# Patient Record
Sex: Male | Born: 1950
Health system: Southern US, Community
[De-identification: ages and names within clinical notes are randomized; demographics above are authoritative.]

## PROBLEM LIST (undated history)

## (undated) DIAGNOSIS — E785 Hyperlipidemia, unspecified: Secondary | ICD-10-CM

## (undated) DIAGNOSIS — K589 Irritable bowel syndrome without diarrhea: Secondary | ICD-10-CM

## (undated) DIAGNOSIS — Z8719 Personal history of other diseases of the digestive system: Secondary | ICD-10-CM

## (undated) DIAGNOSIS — I10 Essential (primary) hypertension: Secondary | ICD-10-CM

## (undated) DIAGNOSIS — N529 Male erectile dysfunction, unspecified: Secondary | ICD-10-CM

## (undated) DIAGNOSIS — K219 Gastro-esophageal reflux disease without esophagitis: Secondary | ICD-10-CM

## (undated) DIAGNOSIS — I872 Venous insufficiency (chronic) (peripheral): Secondary | ICD-10-CM

## (undated) DIAGNOSIS — D649 Anemia, unspecified: Secondary | ICD-10-CM

## (undated) DIAGNOSIS — F419 Anxiety disorder, unspecified: Secondary | ICD-10-CM

## (undated) DIAGNOSIS — G4730 Sleep apnea, unspecified: Secondary | ICD-10-CM

## (undated) DIAGNOSIS — T7840XA Allergy, unspecified, initial encounter: Secondary | ICD-10-CM

## (undated) DIAGNOSIS — Z860100 Personal history of colon polyps, unspecified: Secondary | ICD-10-CM

## (undated) DIAGNOSIS — M1990 Unspecified osteoarthritis, unspecified site: Secondary | ICD-10-CM

## (undated) DIAGNOSIS — Z87440 Personal history of urinary (tract) infections: Secondary | ICD-10-CM

## (undated) DIAGNOSIS — M791 Myalgia, unspecified site: Secondary | ICD-10-CM

## (undated) DIAGNOSIS — Z8701 Personal history of pneumonia (recurrent): Secondary | ICD-10-CM

## (undated) HISTORY — PX: VASECTOMY: SHX75

## (undated) HISTORY — PX: COLON SURGERY: SHX602

## (undated) HISTORY — DX: Allergy, unspecified, initial encounter: T78.40XA

## (undated) HISTORY — DX: Myalgia, unspecified site: M79.10

## (undated) HISTORY — PX: COLONOSCOPY: SHX174

## (undated) HISTORY — DX: Personal history of colonic polyps: Z86.010

## (undated) HISTORY — DX: Gastro-esophageal reflux disease without esophagitis: K21.9

## (undated) HISTORY — DX: Personal history of colon polyps, unspecified: Z86.0100

## (undated) HISTORY — DX: Male erectile dysfunction, unspecified: N52.9

## (undated) HISTORY — DX: Sleep apnea, unspecified: G47.30

## (undated) HISTORY — DX: Anemia, unspecified: D64.9

## (undated) HISTORY — DX: Personal history of other diseases of the digestive system: Z87.19

## (undated) HISTORY — DX: Venous insufficiency (chronic) (peripheral): I87.2

## (undated) HISTORY — DX: Hyperlipidemia, unspecified: E78.5

## (undated) HISTORY — PX: APPENDECTOMY: SHX54

## (undated) HISTORY — DX: Personal history of urinary (tract) infections: Z87.440

## (undated) HISTORY — DX: Anxiety disorder, unspecified: F41.9

## (undated) HISTORY — DX: Essential (primary) hypertension: I10

## (undated) HISTORY — DX: Irritable bowel syndrome, unspecified: K58.9

---

## 2003-04-25 ENCOUNTER — Emergency Department (HOSPITAL_COMMUNITY): Admission: EM | Admit: 2003-04-25 | Discharge: 2003-04-26 | Payer: Self-pay | Admitting: Emergency Medicine

## 2003-12-14 ENCOUNTER — Ambulatory Visit: Payer: Self-pay | Admitting: Pulmonary Disease

## 2004-06-28 ENCOUNTER — Ambulatory Visit: Payer: Self-pay | Admitting: Pulmonary Disease

## 2004-12-06 ENCOUNTER — Ambulatory Visit: Payer: Self-pay | Admitting: Pulmonary Disease

## 2005-06-11 ENCOUNTER — Ambulatory Visit: Payer: Self-pay | Admitting: Pulmonary Disease

## 2005-06-22 ENCOUNTER — Ambulatory Visit: Payer: Self-pay | Admitting: Gastroenterology

## 2005-07-02 ENCOUNTER — Ambulatory Visit: Payer: Self-pay | Admitting: Gastroenterology

## 2006-01-02 ENCOUNTER — Ambulatory Visit: Payer: Self-pay | Admitting: Pulmonary Disease

## 2006-03-13 ENCOUNTER — Ambulatory Visit: Payer: Self-pay | Admitting: Pulmonary Disease

## 2006-07-30 ENCOUNTER — Ambulatory Visit: Payer: Self-pay | Admitting: Pulmonary Disease

## 2006-07-30 LAB — CONVERTED CEMR LAB
AST: 39 units/L — ABNORMAL HIGH (ref 0–37)
BUN: 13 mg/dL (ref 6–23)
Basophils Absolute: 0 10*3/uL (ref 0.0–0.1)
Basophils Relative: 0.2 % (ref 0.0–1.0)
Bilirubin, Direct: 0.1 mg/dL (ref 0.0–0.3)
CO2: 30 meq/L (ref 19–32)
Chloride: 102 meq/L (ref 96–112)
Cholesterol: 154 mg/dL (ref 0–200)
Eosinophils Relative: 11.1 % — ABNORMAL HIGH (ref 0.0–5.0)
GFR calc Af Amer: 73 mL/min
GFR calc non Af Amer: 61 mL/min
Glucose, Bld: 108 mg/dL — ABNORMAL HIGH (ref 70–99)
HCT: 40.4 % (ref 39.0–52.0)
HDL: 44.2 mg/dL (ref >39.0)
LDL Cholesterol: 90 mg/dL (ref 0–99)
Lymphocytes Relative: 35.4 % (ref 12.0–46.0)
MCHC: 34 g/dL (ref 30.0–36.0)
MCV: 84.3 fL (ref 78.0–100.0)
Monocytes Relative: 9.9 % (ref 3.0–11.0)
Neutrophils Relative %: 43.4 % (ref 43.0–77.0)
PSA: 0.47 ng/mL (ref 0.10–4.00)
Platelets: 212 10*3/uL (ref 150–400)
RDW: 12.6 % (ref 11.5–14.6)
Total CHOL/HDL Ratio: 3.5
Total Protein: 7 g/dL (ref 6.0–8.3)
VLDL: 20 mg/dL (ref 0–40)

## 2006-09-05 DIAGNOSIS — E785 Hyperlipidemia, unspecified: Secondary | ICD-10-CM | POA: Insufficient documentation

## 2006-09-06 ENCOUNTER — Ambulatory Visit: Payer: Self-pay | Admitting: Pulmonary Disease

## 2006-11-15 ENCOUNTER — Ambulatory Visit: Payer: Self-pay | Admitting: Pulmonary Disease

## 2007-02-05 ENCOUNTER — Telehealth (INDEPENDENT_AMBULATORY_CARE_PROVIDER_SITE_OTHER): Payer: Self-pay | Admitting: *Deleted

## 2007-02-05 ENCOUNTER — Telehealth: Payer: Self-pay | Admitting: Pulmonary Disease

## 2007-08-11 ENCOUNTER — Telehealth: Payer: Self-pay | Admitting: Pulmonary Disease

## 2007-09-24 ENCOUNTER — Telehealth (INDEPENDENT_AMBULATORY_CARE_PROVIDER_SITE_OTHER): Payer: Self-pay | Admitting: *Deleted

## 2007-10-06 ENCOUNTER — Ambulatory Visit: Payer: Self-pay | Admitting: Pulmonary Disease

## 2007-10-06 DIAGNOSIS — I10 Essential (primary) hypertension: Secondary | ICD-10-CM

## 2007-10-07 ENCOUNTER — Ambulatory Visit: Payer: Self-pay | Admitting: Pulmonary Disease

## 2007-10-17 ENCOUNTER — Telehealth (INDEPENDENT_AMBULATORY_CARE_PROVIDER_SITE_OTHER): Payer: Self-pay | Admitting: *Deleted

## 2007-10-18 DIAGNOSIS — K589 Irritable bowel syndrome without diarrhea: Secondary | ICD-10-CM

## 2007-10-18 DIAGNOSIS — F411 Generalized anxiety disorder: Secondary | ICD-10-CM | POA: Insufficient documentation

## 2007-10-18 DIAGNOSIS — N390 Urinary tract infection, site not specified: Secondary | ICD-10-CM

## 2007-10-18 DIAGNOSIS — F419 Anxiety disorder, unspecified: Secondary | ICD-10-CM | POA: Insufficient documentation

## 2007-10-18 DIAGNOSIS — K2990 Gastroduodenitis, unspecified, without bleeding: Secondary | ICD-10-CM

## 2007-10-18 DIAGNOSIS — F528 Other sexual dysfunction not due to a substance or known physiological condition: Secondary | ICD-10-CM | POA: Insufficient documentation

## 2007-10-18 DIAGNOSIS — M791 Myalgia, unspecified site: Secondary | ICD-10-CM | POA: Insufficient documentation

## 2007-10-18 DIAGNOSIS — I872 Venous insufficiency (chronic) (peripheral): Secondary | ICD-10-CM | POA: Insufficient documentation

## 2007-10-18 DIAGNOSIS — K2970 Gastritis, unspecified, without bleeding: Secondary | ICD-10-CM | POA: Insufficient documentation

## 2007-10-18 LAB — CONVERTED CEMR LAB
ALT: 22 units/L (ref 0–53)
AST: 30 units/L (ref 0–37)
Albumin: 3.8 g/dL (ref 3.5–5.2)
Alkaline Phosphatase: 70 units/L (ref 39–117)
Basophils Absolute: 0 10*3/uL (ref 0.0–0.1)
Bilirubin, Direct: 0.2 mg/dL (ref 0.0–0.3)
Calcium: 9.5 mg/dL (ref 8.4–10.5)
Chloride: 103 meq/L (ref 96–112)
Cholesterol: 183 mg/dL (ref 0–200)
Creatinine, Ser: 1.5 mg/dL (ref 0.4–1.5)
Eosinophils Absolute: 0.2 10*3/uL (ref 0.0–0.7)
GFR calc non Af Amer: 51 mL/min
HCT: 40.4 % (ref 39.0–52.0)
Hemoglobin: 14 g/dL (ref 13.0–17.0)
MCV: 84.6 fL (ref 78.0–100.0)
Monocytes Relative: 13.1 % — ABNORMAL HIGH (ref 3.0–12.0)
Neutrophils Relative %: 45.5 % (ref 43.0–77.0)
Potassium: 4.4 meq/L (ref 3.5–5.1)
RDW: 12.9 % (ref 11.5–14.6)
TSH: 1.99 microintl units/mL (ref 0.35–5.50)
Total Bilirubin: 0.8 mg/dL (ref 0.3–1.2)
Triglycerides: 86 mg/dL (ref 0–149)

## 2008-01-05 ENCOUNTER — Telehealth (INDEPENDENT_AMBULATORY_CARE_PROVIDER_SITE_OTHER): Payer: Self-pay | Admitting: *Deleted

## 2008-02-09 ENCOUNTER — Telehealth: Payer: Self-pay | Admitting: Pulmonary Disease

## 2008-09-30 ENCOUNTER — Telehealth: Payer: Self-pay | Admitting: Pulmonary Disease

## 2008-10-04 ENCOUNTER — Ambulatory Visit: Payer: Self-pay | Admitting: Pulmonary Disease

## 2008-10-09 LAB — CONVERTED CEMR LAB
ALT: 32 units/L (ref 0–53)
AST: 52 units/L — ABNORMAL HIGH (ref 0–37)
Alkaline Phosphatase: 67 units/L (ref 39–117)
BUN: 13 mg/dL (ref 6–23)
Bilirubin Urine: NEGATIVE
Bilirubin, Direct: 0.2 mg/dL (ref 0.0–0.3)
Calcium: 9.8 mg/dL (ref 8.4–10.5)
Chloride: 104 meq/L (ref 96–112)
Creatinine, Ser: 1.4 mg/dL (ref 0.4–1.5)
GFR calc non Af Amer: 66.77 mL/min (ref >60)
LDL Cholesterol: 101 mg/dL — ABNORMAL HIGH (ref 0–99)
MCV: 83.9 fL (ref 78.0–100.0)
Nitrite: NEGATIVE
PSA: 0.5 ng/mL (ref 0.10–4.00)
RBC: 5.03 M/uL (ref 4.22–5.81)
Specific Gravity, Urine: 1.02 (ref 1.000–1.030)
TSH: 1.24 microintl units/mL (ref 0.35–5.50)
Total CHOL/HDL Ratio: 3
Total Protein, Urine: NEGATIVE mg/dL
Total Protein: 8.8 g/dL — ABNORMAL HIGH (ref 6.0–8.3)
Urine Glucose: NEGATIVE mg/dL
Urobilinogen, UA: 0.2 (ref 0.0–1.0)
WBC: 7.1 10*3/uL (ref 4.5–10.5)
pH: 6 (ref 5.0–8.0)

## 2009-04-15 ENCOUNTER — Telehealth: Payer: Self-pay | Admitting: Pulmonary Disease

## 2009-04-19 ENCOUNTER — Telehealth (INDEPENDENT_AMBULATORY_CARE_PROVIDER_SITE_OTHER): Payer: Self-pay | Admitting: *Deleted

## 2009-04-21 ENCOUNTER — Encounter: Payer: Self-pay | Admitting: Pulmonary Disease

## 2009-05-23 ENCOUNTER — Telehealth (INDEPENDENT_AMBULATORY_CARE_PROVIDER_SITE_OTHER): Payer: Self-pay | Admitting: *Deleted

## 2009-05-23 ENCOUNTER — Telehealth: Payer: Self-pay | Admitting: Gastroenterology

## 2009-05-23 DIAGNOSIS — Z8719 Personal history of other diseases of the digestive system: Secondary | ICD-10-CM

## 2009-05-24 ENCOUNTER — Encounter (INDEPENDENT_AMBULATORY_CARE_PROVIDER_SITE_OTHER): Payer: Self-pay | Admitting: *Deleted

## 2009-05-24 ENCOUNTER — Ambulatory Visit: Payer: Self-pay | Admitting: Gastroenterology

## 2009-05-24 DIAGNOSIS — R195 Other fecal abnormalities: Secondary | ICD-10-CM

## 2009-05-25 LAB — CONVERTED CEMR LAB
AST: 59 units/L — ABNORMAL HIGH (ref 0–37)
Alkaline Phosphatase: 61 units/L (ref 39–117)
Basophils Absolute: 0 10*3/uL (ref 0.0–0.1)
CO2: 29 meq/L (ref 19–32)
Calcium: 9.7 mg/dL (ref 8.4–10.5)
Ferritin: 87.8 ng/mL (ref 22.0–322.0)
Glucose, Bld: 91 mg/dL (ref 70–99)
Hemoglobin: 13.8 g/dL (ref 13.0–17.0)
Lymphs Abs: 1.4 10*3/uL (ref 0.7–4.0)
Monocytes Relative: 9.9 % (ref 3.0–12.0)
Potassium: 4.7 meq/L (ref 3.5–5.1)
RBC: 4.81 M/uL (ref 4.22–5.81)
RDW: 13.7 % (ref 11.5–14.6)
Saturation Ratios: 25.7 % (ref 20.0–50.0)
TSH: 1.01 microintl units/mL (ref 0.35–5.50)

## 2009-06-01 ENCOUNTER — Ambulatory Visit: Payer: Self-pay | Admitting: Gastroenterology

## 2009-06-02 ENCOUNTER — Telehealth: Payer: Self-pay | Admitting: Gastroenterology

## 2009-06-03 ENCOUNTER — Encounter: Payer: Self-pay | Admitting: Gastroenterology

## 2009-06-24 ENCOUNTER — Encounter: Payer: Self-pay | Admitting: Gastroenterology

## 2009-07-01 HISTORY — PX: COLON SURGERY: SHX602

## 2009-07-08 ENCOUNTER — Encounter: Admission: RE | Admit: 2009-07-08 | Discharge: 2009-07-08 | Payer: Self-pay | Admitting: General Surgery

## 2009-07-21 ENCOUNTER — Encounter (INDEPENDENT_AMBULATORY_CARE_PROVIDER_SITE_OTHER): Payer: Self-pay | Admitting: General Surgery

## 2009-07-21 ENCOUNTER — Inpatient Hospital Stay (HOSPITAL_COMMUNITY): Admission: RE | Admit: 2009-07-21 | Discharge: 2009-07-25 | Payer: Self-pay | Admitting: General Surgery

## 2009-08-01 ENCOUNTER — Encounter: Payer: Self-pay | Admitting: Gastroenterology

## 2009-11-01 ENCOUNTER — Telehealth: Payer: Self-pay | Admitting: Pulmonary Disease

## 2010-01-10 ENCOUNTER — Other Ambulatory Visit: Payer: Self-pay | Admitting: Pulmonary Disease

## 2010-01-10 ENCOUNTER — Ambulatory Visit
Admission: RE | Admit: 2010-01-10 | Discharge: 2010-01-10 | Payer: Self-pay | Source: Home / Self Care | Attending: Pulmonary Disease | Admitting: Pulmonary Disease

## 2010-01-10 LAB — CBC WITH DIFFERENTIAL/PLATELET
Basophils Absolute: 0 10*3/uL (ref 0.0–0.1)
Basophils Relative: 0.5 % (ref 0.0–3.0)
Eosinophils Absolute: 0.4 10*3/uL (ref 0.0–0.7)
Eosinophils Relative: 11.6 % — ABNORMAL HIGH (ref 0.0–5.0)
HCT: 39.3 % (ref 39.0–52.0)
Hemoglobin: 13.5 g/dL (ref 13.0–17.0)
Lymphocytes Relative: 41.3 % (ref 12.0–46.0)
Lymphs Abs: 1.4 10*3/uL (ref 0.7–4.0)
MCHC: 34.4 g/dL (ref 30.0–36.0)
MCV: 83.2 fl (ref 78.0–100.0)
Monocytes Absolute: 0.3 10*3/uL (ref 0.1–1.0)
Monocytes Relative: 10 % (ref 3.0–12.0)
Neutro Abs: 1.2 10*3/uL — ABNORMAL LOW (ref 1.4–7.7)
Neutrophils Relative %: 36.6 % — ABNORMAL LOW (ref 43.0–77.0)
Platelets: 239 10*3/uL (ref 150.0–400.0)
RBC: 4.72 Mil/uL (ref 4.22–5.81)
RDW: 13.5 % (ref 11.5–14.6)
WBC: 3.4 10*3/uL — ABNORMAL LOW (ref 4.5–10.5)

## 2010-01-10 LAB — HEPATIC FUNCTION PANEL
ALT: 28 U/L (ref 0–53)
AST: 43 U/L — ABNORMAL HIGH (ref 0–37)
Albumin: 4.2 g/dL (ref 3.5–5.2)
Alkaline Phosphatase: 74 U/L (ref 39–117)
Bilirubin, Direct: 0.1 mg/dL (ref 0.0–0.3)
Total Bilirubin: 0.8 mg/dL (ref 0.3–1.2)
Total Protein: 7.9 g/dL (ref 6.0–8.3)

## 2010-01-10 LAB — BASIC METABOLIC PANEL
BUN: 12 mg/dL (ref 6–23)
CO2: 32 mEq/L (ref 19–32)
Calcium: 9.9 mg/dL (ref 8.4–10.5)
Chloride: 105 mEq/L (ref 96–112)
Creatinine, Ser: 1.1 mg/dL (ref 0.4–1.5)
GFR: 86.01 mL/min (ref >60.00)
Glucose, Bld: 84 mg/dL (ref 70–99)
Potassium: 4.6 mEq/L (ref 3.5–5.1)
Sodium: 141 mEq/L (ref 135–145)

## 2010-01-10 LAB — LIPID PANEL
Cholesterol: 154 mg/dL (ref 0–200)
HDL: 56.5 mg/dL (ref >39.00)
LDL Cholesterol: 87 mg/dL (ref 0–99)
Total CHOL/HDL Ratio: 3
Triglycerides: 53 mg/dL (ref 0.0–149.0)
VLDL: 10.6 mg/dL (ref 0.0–40.0)

## 2010-01-10 LAB — TSH: TSH: 1.52 u[IU]/mL (ref 0.35–5.50)

## 2010-01-10 LAB — PSA: PSA: 1.96 ng/mL (ref 0.10–4.00)

## 2010-02-02 NOTE — Progress Notes (Signed)
Summary: talk to nurse-LMTCB  Phone Note Call from Patient Call back at Work Phone [phone]   Caller: Patient Call For: nadel Summary of Call: Blood in stool, pls advise. Initial call taken by: Darletta Moll,  May 23, 2009 11:37 AM  Follow-up for Phone Call        +LMTCB. Carron Curie CMA  May 23, 2009 11:58 AM   Patient returning call.  Please call on cell--[phone]. Follow-up by: Lehman Prom,  May 23, 2009 12:35 PM  Additional Follow-up for Phone Call Additional follow up Details #1::        pt c/o x2 this weekend he saw bright red blood when he had a bowel movement. The blood was in the water and also when he wiped. He denies constipation. He states this happended several years ago and he was worked up and discovered he had polyps, but has never had any bleeding since then. He cannot remember what MD he saw for this years ago. Please advise. Carron Curie CMA  May 23, 2009 1:16 PM   New Problems: HEMATOCHEZIA, HX OF (ICD-V12.79)   Additional Follow-up for Phone Call Additional follow up Details #2::    per SN---refer to GI for eval of hematochezia.  order has been put in computer for pt. Randell Loop CMA  May 23, 2009 1:49 PM   Spoke with pt and advised that per SN- needs to see GI.  Pt aware that order has already been sent. Follow-up by: Vernie Murders,  May 23, 2009 2:05 PM  New Problems: HEMATOCHEZIA, HX OF (ICD-V12.79)

## 2010-02-02 NOTE — Letter (Signed)
Summary: Patient Notice- Polyp Results  [facility]  [address] [place], [place] [address]   Phone: [phone]  Fax: [phone]        June 03, 2009 MRN: [id number]    [facility] [address] [place], [place]  [address]    Dear Robert [name],  I am pleased to inform you that the colon polyp(s) removed during your recent colonoscopy was (were) found to be benign (no cancer detected) upon pathologic examination.  I recommend you have a repeat colonoscopy examination in 3_ years to look for recurrent polyps, as having colon polyps increases your risk for having recurrent polyps or even colon cancer in the future.Pathologic exam of the polyp shows a large villous adenoma that will require surgical resection as we discussed.  Should you develop new or worsening symptoms of abdominal pain, bowel habit changes or bleeding from the rectum or bowels, please schedule an evaluation with either your primary care physician or with me.  Additional information/recommendations:  __ No further action with gastroenterology is needed at this time. Please      follow-up with your primary care physician for your other healthcare      needs.  _x_ Please call [phone] to schedule a return visit to review your      situation if you have any further questions. __ Please keep your follow-up visit as already scheduled.  _x_ Continue treatment plan as outlined the day of your exam.  Please call us if you are having persistent problems or have questions about your condition that have not been fully answered at this time.  Sincerely,  Mardella Layman MD [facility]  This letter has been electronically signed by your physician.  Appended Document: Patient Notice- Polyp Results letter mailed.

## 2010-02-02 NOTE — Progress Notes (Signed)
Summary: Appt [facility]  Phone Note Outgoing Call   Call placed by: Ashok Cordia RN,  June 02, 2009 2:26 PM Summary of Call: records faxed to [facility] for appt with Dr. Gaynelle Adu re surgical resection of sessile polyp. Initial call taken by: Ashok Cordia RN,  June 02, 2009 2:28 PM  Follow-up for Phone Call        [place] calling from [facility].  Pt has appt to see Dr. Andrey Campanile on June 24.  Arrive at 8:30.   Lupita Leash Surface RN  June 02, 2009 4:37 PM  LM for pt to call.  Lupita Leash Surface RN  June 03, 2009 2:08 PM   Pt notified of appt. Follow-up by: Ashok Cordia RN,  June 06, 2009 8:18 AM

## 2010-02-02 NOTE — Assessment & Plan Note (Signed)
Summary: one year//jrc   Primary Care Provider:  Alroy Dust, MD  CC:  15 month ROV & review of mult medical problems....  History of Present Illness: 60 y/o Robert [name] here for a follow up visit and CPX... he has multiple medical problems as noted below...     ~  January 10, 2010:  [name] saw some rectal blood 5/11 & saw DrPatterson for eval- subseq colonoscopy revealed a large multilobulated sessile polyp at hepatic flexure- bx= tubulovillous adenoma & he had a lap-assisted right hemicolectomy 7/11 by DrWilson... uneventful post-op recovery & back to normal now... BP controlled on Aten/ Amlod;  Chol has been good on diet+ Gemfib;  he uses Omep40 Prn for acid symptoms;  uses Alpraz 1/2 Prn as well...    Current Problem List:  PHYSICAL EXAMINATION (ICD-V70.0) - on ASA 81mg /d... exercises regularly w/ elliptical machine, weights, etc... he had PNEUMOVAX in 2005 (age 38)... he gets the yearly Flu vaccine each autumn.  HYPERTENSION (ICD-401.9) - on ATENOLOL 100mg /d,  & NORVASC 10mg /d... BP= 128/82 today and similar at home, taking meds regulaly & tolerating well... denies HA, fatigue, visual changes, CP, palipit, dizziness, syncope, dyspnea, edema, etc...   VENOUS INSUFFICIENCY (ICD-459.81) - he knows to elim sodium, elevate legs, wear support hose...   HYPERLIPIDEMIA, BORDERLINE (ICD-272.4) - INTOL to Statin meds... on GEMFIBRIZOL 600mg Bid w/ good control- his Chol has been over 300 in the past & intol to statins w/ incr musc enz.  ~  FLP 7/08 (wt=218#) showed TChol 154, TG 98, HDL 44, LDL 90...  ~  FLP 10/09 (wt=222#) showed TChol 183, TG 86, HDL 48, LDL 118... rec> same med + better diet.  ~  FLP 10/10 (wt=216#) showed TChol 173, TG 99, HDL 53, LDL 101  ~  FLP 1/12 (wt=209#) showed TChol 154, TG 53, HDL 57, LDL 87  Hx of GASTRITIS (ICD-535.50) - on OMEPRAZOLE 40mg  Prn... denies nausea, vomiting, heartburn, diarrhea, constipation, blood in stool, abdominal pain, swelling, gas...   ~  EGD 7/07  by DrPatterson showed gastritis & duodenitis- HPylori neg...  IRRITABLE BOWEL SYNDROME (ICD-564.1) - Hx IBS w/ constipation...  COLONIC POLYPS (ICD-211.3) - s/p resection villous adenoma 7/11...  ~  colonoscopy 7/07 by DrPatterson was WNL.  ~  pt saw some rectal blood 5/11> eval DrPatterson w/ colonoscopy revealing a large multilobulated sessile polyp at hepatic flexure- bx= tubulovillous adenoma & he had a lap-assisted right hemicolectomy 7/11 by DrWilson.... surg path showed high grade dysplasia, no cancer, neg nodes.  Hx of URINARY TRACT INFECTION (ICD-599.0) - he has been evaluated by DrWrenn in 2001 w/ prostatitis... he has some nocturia & we discussed strategies to decr this...  Hx of ERECTILE DYSFUNCTION, MILD (ICD-302.72) - on VIAGRA 100mg Prn...  Hx of MYALGIA (ICD-729.1) - s/p eval from DrZieminski in 2005 & his CPK elevations were believed secondary to prev Statin Rx...  ANXIETY (ICD-300.00) - uses ALPRAZOLAM 0.5mg - 1/2 to 1 tab Tid Prn...   Preventive Screening-Counseling & Management  Alcohol-Tobacco     Smoking Status: never  Caffeine-Diet-Exercise     Does Patient Exercise: yes  Allergies: 1)  ! Lipitor (Atorvastatin Calcium)  Comments:  Nurse/Medical Assistant: The patient's medications and allergies were reviewed with the patient and were updated in the Medication and Allergy Lists.  Past History:  Past Medical History: HYPERTENSION (ICD-401.9) VENOUS INSUFFICIENCY (ICD-459.81) HYPERLIPIDEMIA, BORDERLINE (ICD-272.4) Hx of GASTRITIS (ICD-535.50) IRRITABLE BOWEL SYNDROME (ICD-564.1) COLONIC POLYPS (ICD-211.3) - s/p right hemicolectomy for tubulovillous adenoma 7/11 Hx of URINARY TRACT INFECTION (  ICD-599.0) Hx of ERECTILE DYSFUNCTION, MILD (ICD-302.72) Hx of MYALGIA (ICD-729.1) ANXIETY (ICD-300.00)  Past Surgical History: S/P appendectomy S/P vasectomy S/P right hemicolectomy 7/11 for tubulovillous adenoma by DrWilson  Family History: Reviewed history  from 05/24/2009 and no changes required. Father died age 60 w/ alzheimer's dis & diabetes Mother died age 18 w/ heart attack & diabetes 5 Siblings- 2 Bro & 3 Sis- several w/ HBP No FH of Colon Cancer: 1st cousin Family History of Prostate Cancer: Uncle Family History of Diabetes: Mother, Father  Social History: Reviewed history from 05/24/2009 and no changes required. Occupation: Psychologist, forensic Alcohol Use - yes Daily Caffeine Use-5 cups per week Illicit Drug Use - no Patient gets regular exercise. Patient has never smoked.   Review of Systems  The patient denies fever, chills, sweats, anorexia, fatigue, weakness, malaise, weight loss, sleep disorder, blurring, diplopia, eye irritation, eye discharge, vision loss, eye pain, photophobia, earache, ear discharge, tinnitus, decreased hearing, nasal congestion, nosebleeds, sore throat, hoarseness, chest pain, palpitations, syncope, dyspnea on exertion, orthopnea, PND, peripheral edema, cough, dyspnea at rest, excessive sputum, hemoptysis, wheezing, pleurisy, nausea, vomiting, diarrhea, constipation, change in bowel habits, abdominal pain, melena, hematochezia, jaundice, gas/bloating, indigestion/heartburn, dysphagia, odynophagia, dysuria, hematuria, urinary frequency, urinary hesitancy, nocturia, incontinence, back pain, joint pain, joint swelling, muscle cramps, muscle weakness, stiffness, arthritis, sciatica, restless legs, leg pain at night, leg pain with exertion, rash, itching, dryness, suspicious lesions, paralysis, paresthesias, seizures, tremors, vertigo, transient blindness, frequent falls, frequent headaches, difficulty walking, depression, anxiety, memory loss, confusion, cold intolerance, heat intolerance, polydipsia, polyphagia, polyuria, unusual weight change, abnormal bruising, bleeding, enlarged lymph nodes, urticaria, allergic rash, hay fever, and recurrent infections.    Vital Signs:  Patient profile:   60 year old  male Height:      71 inches Weight:      209 pounds BMI:     29.25 O2 Sat:      100 % on Room air Temp:     97.4 degrees F oral Pulse rate:   44 / minute BP sitting:   128 / 82  (left arm) Cuff size:   regular  Vitals Entered By: Randell Loop CMA (January 10, 2010 2:35 PM)  O2 Sat at Rest %:  100 O2 Flow:  Room air CC: 15 month ROV & review of mult medical problems... Is Patient Diabetic? No Pain Assessment Patient in pain? no      Comments no changes in meds today   Physical Exam  Additional Exam:  WD, WN, 60 y/o Robert [name] in NAD... body habitus is mesomorphic... GENERAL:  Alert & oriented; pleasant & cooperative... HEENT:  [place]/AT, EOM-wnl, PERRLA, EACs-clear, TMs-wnl, NOSE-clear, THROAT-clear & wnl. NECK:  Supple w/ full ROM; no JVD; normal carotid impulses w/o bruits; no thyromegaly or nodules palpated; no lymphadenopathy. CHEST:  Clear to P & A; without wheezes/ rales/ or rhonchi. HEART:  Regular Rhythm; without murmurs/ rubs/ or gallops. ABDOMEN:  Soft & nontender; normal bowel sounds; no organomegaly or masses detected. RECTAL:  Neg - prostate 2+ & nontender w/o nodules; stool hematest neg. EXT:  without deformities, mild arthritic changes; no varicose veins/ +venous insuffic/ no edema. NEURO:  CN's intact; motor testing normal; sensory testing normal; gait normal & balance OK. DERM:  No lesions noted; no rash etc...    CXR  Procedure date:  01/10/2010  Findings:      CHEST - 2 VIEW Comparison: 10/04/2008   Findings: Heart size within normal limits.  Pulmonary vascularity is normal.  Lungs are  clear without infiltrate effusion or mass lesion.  No change from prior study.   IMPRESSION: No active cardiopulmonary disease.   Read By:  Camelia Phenes,  M.D.   MISC. Report  Procedure date:  01/10/2010  Findings:       BMP (METABOL)   Sodium                    141 mEq/L                   135-145   Potassium                 4.6 mEq/L                   3.5-5.1    Chloride                  105 mEq/L                   96-112   Carbon Dioxide            32 mEq/L                    19-32   Glucose                   84 mg/dL                    78-46   BUN                       12 mg/dL                    9-62   Creatinine                1.1 mg/dL                   9.5-2.8   Calcium                   9.9 mg/dL                   4.1-32.4   GFR                       86.01 mL/min                >60.00  Hepatic/Liver Function Panel (HEPATIC)   Total Bilirubin           0.8 mg/dL                   4.0-1.0   Direct Bilirubin          0.1 mg/dL                   2.7-2.5   Alkaline Phosphatase      74 U/L                      39-117   AST                  [H]  43 U/L                      0-37   ALT  28 U/L                      0-53   Total Protein             7.9 g/dL                    1.6-1.0   Albumin                   4.2 g/dL                    9.6-0.4  CBC Platelet w/Diff (CBCD)   White Cell Count     [L]  3.4 K/uL                    4.5-10.5   Red Cell Count            4.72 Mil/uL                 4.22-5.81   Hemoglobin                13.5 g/dL                   54.0-98.1   Hematocrit                39.3 %                      39.0-52.0   MCV                       83.2 fl                     78.0-100.0   Platelet Count            239.0 K/uL                  150.0-400.0   Neutrophil %         [L]  36.6 %                      43.0-77.0   Lymphocyte %              41.3 %                      12.0-46.0   Monocyte %                10.0 %                      3.0-12.0   Eosinophils%         [H]  11.6 %                      0.0-5.0   Basophils %               0.5 %                       0.0-3  Comments:      Lipid Panel (LIPID)   Cholesterol               154 mg/dL                   1-914   Triglycerides  53.0 mg/dL                  3.5-009.3   HDL                       81.82 mg/dL                 >99.37   LDL Cholesterol            87 mg/dL                    1-69   TSH (TSH)   FastTSH                   1.52 uIU/mL                 0.35-5.50  Prostate Specific Antigen (PSA)   PSA-Hyb                   1.96 ng/mL                  0.10-4.00   Impression & Recommendations:  Problem # 1:  PHYSICAL EXAMINATION (ICD-V70.0)  Orders: T-2 View CXR (71020TC) TLB-BMP (Basic Metabolic Panel-BMET) (80048-METABOL) TLB-Hepatic/Liver Function Pnl (80076-HEPATIC) TLB-CBC Platelet - w/Differential (85025-CBCD) TLB-Lipid Panel (80061-LIPID) TLB-TSH (Thyroid Stimulating Hormone) (84443-TSH) TLB-PSA (Prostate Specific Antigen) (84153-PSA)  Problem # 2:  HYPERTENSION (ICD-401.9) BP controlled>  same meds. His updated medication list for this problem includes:    Atenolol 100 Mg Tabs (Atenolol) .Marland Kitchen... Take 1 tablet by mouth once a day    Amlodipine Besylate 10 Mg Tabs (Amlodipine besylate) .Marland Kitchen... Take 1 tablet by mouth once a day  Problem # 3:  HYPERLIPIDEMIA, BORDERLINE (ICD-272.4) FLP looks good on the Gemfib Rx>  continue same. His updated medication list for this problem includes:    Gemfibrozil 600 Mg Tabs (Gemfibrozil) .Marland Kitchen... Take 1 tablet by mouth two times a day  Problem # 4:  Hx of GASTRITIS (ICD-535.50) Stable>  just uses Omep Prn... His updated medication list for this problem includes:    Omeprazole 40 Mg Cpdr (Omeprazole) .Marland Kitchen... Take 1 tablet by mouth daily- 30 min before the first meal of the day...  Problem # 5:  COLONIC POLYPS (ICD-211.3) S/P resection villous adenoma as noted... surg path w/ focal hi grade dysplasia noted, no cancer seen.  Problem # 6:  ANXIETY (ICD-300.00) He uses the [employer]... His updated medication list for this problem includes:    Alprazolam 0.5 Mg Tabs (Alprazolam) .Marland Kitchen... Take 1/2-1 tab three times a day as needed for nerves...  Complete Medication List: 1)  Xyzal 5 Mg Tabs (Levocetirizine dihydrochloride) .... Take one tablet by mouth every morning as needed 2)  Adult  Aspirin Ec Low Strength 81 Mg Tbec (Aspirin) .... Take 1 tablet by mouth once a day 3)  Atenolol 100 Mg Tabs (Atenolol) .... Take 1 tablet by mouth once a day 4)  Amlodipine Besylate 10 Mg Tabs (Amlodipine besylate) .... Take 1 tablet by mouth once a day 5)  Gemfibrozil 600 Mg Tabs (Gemfibrozil) .... Take 1 tablet by mouth two times a day 6)  Omeprazole 40 Mg Cpdr (Omeprazole) .... Take 1 tablet by mouth daily- 30 min before the first meal of the day... 7)  Viagra 100 Mg Tabs (Sildenafil citrate) .... Take as directed 8)  Alprazolam 0.5 Mg Tabs (Alprazolam) .... Take 1/2-1 tab three times a day as needed for nerves...  Patient Instructions:  1)  Today we updated your med list- see below.... 2)  We refilled your meds for 2012... 3)  Today we did your follow up CXR & fasting blood work...  please call the "phone tree" in a few days for your lab results.Marland KitchenMarland Kitchen 4)  Call for any problems.Marland KitchenMarland Kitchen 5)  Please schedule a follow-up appointment in 1 year, sooner as needed... Prescriptions: ALPRAZOLAM 0.5 MG TABS (ALPRAZOLAM) take 1/2-1 tab three times a day as needed for nerves...  #90 x 6   Entered and Authorized by:   Michele Mcalpine MD   Signed by:   Michele Mcalpine MD on 01/10/2010   Method used:   Print then Give to Patient   RxID:   [id number] VIAGRA 100 MG  TABS (SILDENAFIL CITRATE) take as directed  #10 x prn   Entered and Authorized by:   Michele Mcalpine MD   Signed by:   Michele Mcalpine MD on 01/10/2010   Method used:   Print then Give to Patient   RxID:   [id number] OMEPRAZOLE 40 MG CPDR (OMEPRAZOLE) Take 1 tablet by mouth daily- 30 min before the first meal of the day...  #30 x 12   Entered and Authorized by:   Michele Mcalpine MD   Signed by:   Michele Mcalpine MD on 01/10/2010   Method used:   Print then Give to Patient   RxID:   [id number] GEMFIBROZIL 600 MG  TABS (GEMFIBROZIL) Take 1 tablet by mouth two times a day  #60 x 12   Entered and Authorized by:   Michele Mcalpine MD   Signed by:    Michele Mcalpine MD on 01/10/2010   Method used:   Print then Give to Patient   RxID:   [id number] AMLODIPINE BESYLATE 10 MG TABS (AMLODIPINE BESYLATE) Take 1 tablet by mouth once a day  #30 x 12   Entered and Authorized by:   Michele Mcalpine MD   Signed by:   Michele Mcalpine MD on 01/10/2010   Method used:   Print then Give to Patient   RxID:   [id number] ATENOLOL 100 MG  TABS (ATENOLOL) Take 1 tablet by mouth once a day  #30 x 12   Entered and Authorized by:   Michele Mcalpine MD   Signed by:   Michele Mcalpine MD on 01/10/2010   Method used:   Print then Give to Patient   RxID:   [id number]    Immunization History:  Influenza Immunization History:    Influenza:  historical (12/05/2009)

## 2010-02-02 NOTE — Consult Note (Signed)
Summary: [facility]   Imported By: Sherian Rein 07/18/2009 08:36:37  _____________________________________________________________________  External Attachment:    Type:   Image     Comment:   External Document

## 2010-02-02 NOTE — Progress Notes (Signed)
Summary: allergies  Phone Note Call from Patient Call back at [phone]   Caller: Patient Call For: Dawn Kiper Summary of Call: Wants something called in for his allergies, has been taking zyrtec but it's not working./[facility] [address] Initial call taken by: Darletta Moll,  April 15, 2009 10:25 AM  Follow-up for Phone Call        called, spoke with pt.  Pt c/o chest congestion, watery and itchy eyes, burning in nasal passages, PND, and runny nose with clear drainage since Wednesday.  Has been taking zyrtec - not helping.  [facility].  Allergies-Lipitor.  Will forward to SN - pls advise.   Follow-up by: Gweneth Dimitri RN,  April 15, 2009 11:26 AM  Additional Follow-up for Phone Call Additional follow up Details #1::        called and spoke with pt and he is aware of SN recs---meds have been sent to pts pharmacy and pt is aware Randell Loop CMA  April 15, 2009 5:10 PM     New/Updated Medications: XYZAL 5 MG TABS (LEVOCETIRIZINE DIHYDROCHLORIDE) take one tablet by mouth every morning MEDROL (PAK) 4 MG TABS (METHYLPREDNISOLONE) take as directed x 6 days Prescriptions: MEDROL (PAK) 4 MG TABS (METHYLPREDNISOLONE) take as directed x 6 days  #1 pak x 0   Entered by:   Randell Loop CMA   Authorized by:   Michele Mcalpine MD   Signed by:   Randell Loop CMA on 04/15/2009   Method used:   Electronically to        [facility]. [phone]* (retail)       [address]       [place], [place]  [address]       Ph: [phone]       Fax: [phone]   RxID:   [id number] XYZAL 5 MG TABS (LEVOCETIRIZINE DIHYDROCHLORIDE) take one tablet by mouth every morning  #30 x 6   Entered by:   Randell Loop CMA   Authorized by:   Michele Mcalpine MD   Signed by:   Randell Loop CMA on 04/15/2009   Method used:   Electronically to        [facility]. [phone]* (retail)       [address]       [place], [place]  [address]       Ph:  [phone]       Fax: [phone]   RxID:   [id number]

## 2010-02-02 NOTE — Assessment & Plan Note (Signed)
Summary: Rectal Bleeding/dfs   History of Present Illness Visit Type: Follow-up Visit Primary GI MD: Sheryn Bison MD FACP FAGA Primary Provider: Alroy Dust, MD Chief Complaint: Patient c/o brbpr with bowel movements since Saturday. He denies any current constipation or diarrhea. He also denies any abdominal pain or other GI symptoms. History of Present Illness:   Extremely Pleasant 60 year old [place] American male a strong family history of colon cancer. Had a negative colonoscopy 5 years ago except for mixed hemorrhoidal tissue in his rectum. He's been asymptomatic until the last 2-3 days when he has noticed bright red blood per rectum without abdominal or rectal pain. He denies NSAID, alcohol, or cigarette use although he has used some ibuprofen shortly before his rectal bleeding began. He denies indigestion or dyspepsia otherwise. He does take daily omeprazole. Previous endoscopy result was unremarkable. He denies any symptoms of hypovolemia, cardiovascular or pulmonary complaints. He does have essential hypertension is on multiple medications listed and reviewed in his chart. Family history is positive for colon carcinoma at an early age in a second-degree relative.   GI Review of Systems    Reports acid reflux.      Denies abdominal pain, belching, bloating, chest pain, dysphagia with liquids, dysphagia with solids, heartburn, loss of appetite, nausea, vomiting, vomiting blood, weight loss, and  weight gain.      Reports rectal bleeding.     Denies anal fissure, black tarry stools, change in bowel habit, constipation, diarrhea, diverticulosis, fecal incontinence, heme positive stool, hemorrhoids, irritable bowel syndrome, jaundice, light color stool, liver problems, and  rectal pain. Preventive Screening-Counseling & Management  Alcohol-Tobacco     Smoking Status: never  Caffeine-Diet-Exercise     Does Patient Exercise: yes      Drug Use:  no.      Current Medications  (verified): 1)  Adult Aspirin Ec Low Strength 81 Mg  Tbec (Aspirin) .... Take 1 Tablet By Mouth Once A Day 2)  Atenolol 100 Mg  Tabs (Atenolol) .... Take 1 Tablet By Mouth Once A Day 3)  Amlodipine Besylate 10 Mg Tabs (Amlodipine Besylate) .... Take 1 Tablet By Mouth Once A Day 4)  Gemfibrozil 600 Mg  Tabs (Gemfibrozil) .... Take 1 Tablet By Mouth Two Times A Day 5)  Omeprazole 40 Mg Cpdr (Omeprazole) .... Take 1 Tablet By Mouth Once A Day As Needed 6)  Viagra 100 Mg  Tabs (Sildenafil Citrate) .... Take As Directed 7)  Alprazolam 0.5 Mg Tabs (Alprazolam) .... Take 1/2-1 Tab Three Times A Day As Needed For Nerves... 8)  Xyzal 5 Mg Tabs (Levocetirizine Dihydrochloride) .... Take One Tablet By Mouth Every Morning As Needed  Allergies (verified): 1)  ! Lipitor (Atorvastatin Calcium)  Past History:  Past medical, surgical, family and social histories (including risk factors) reviewed for relevance to current acute and chronic problems.  Past Medical History: Reviewed history from 10/04/2008 and no changes required. HYPERTENSION (ICD-401.9) VENOUS INSUFFICIENCY (ICD-459.81) HYPERLIPIDEMIA, BORDERLINE (ICD-272.4) Hx of GASTRITIS (ICD-535.50) IRRITABLE BOWEL SYNDROME (ICD-564.1) Hx of URINARY TRACT INFECTION (ICD-599.0) Hx of ERECTILE DYSFUNCTION, MILD (ICD-302.72) Hx of MYALGIA (ICD-729.1) ANXIETY (ICD-300.00)  Past Surgical History: S/P appendectomy S/P vasectomy  Family History: Reviewed history from 10/06/2007 and no changes required. Father died age 94 w/ alzheimer's dis & diabetes Mother died age 27 w/ heart attack & diabetes 5 Siblings- 2 Bro & 3 Sis- several w/ HBP No FH of Colon Cancer: 1st cousin Family History of Prostate Cancer: Uncle Family History of Diabetes: Mother, Father  Social History: Reviewed history and no changes required. Occupation: Psychologist, forensic Alcohol Use - yes Daily Caffeine Use-5 cups per week Illicit Drug Use - no Patient gets regular  exercise. Patient has never smoked.  Drug Use:  no Does Patient Exercise:  yes  Review of Systems       The patient complains of allergy/sinus and sleeping problems.  The patient denies anemia, anxiety-new, arthritis/joint pain, back pain, blood in urine, breast changes/lumps, change in vision, confusion, cough, coughing up blood, depression-new, fainting, fatigue, fever, headaches-new, hearing problems, heart murmur, heart rhythm changes, itching, menstrual pain, muscle pains/cramps, night sweats, nosebleeds, pregnancy symptoms, shortness of breath, skin rash, sore throat, swelling of feet/legs, swollen lymph glands, thirst - excessive , urination - excessive , urination changes/pain, urine leakage, vision changes, and voice change.    Vital Signs:  Patient profile:   60 year old male Height:      71 inches Weight:      213 pounds BMI:     29.81 BSA:     2.17 Pulse rate:   64 / minute Pulse rhythm:   regular BP sitting:   134 / 78  (left arm)  Vitals Entered By: Lamona Curl CMA Duncan Dull) (May 24, 2009 11:12 AM)  Physical Exam  General:  Well developed, well nourished, no acute distress.healthy appearing.  healthy appearing.   Head:  Normocephalic and atraumatic. Eyes:  PERRLA, no icterus.exam deferred to patient's ophthalmologist.  exam deferred to patient's ophthalmologist.   Lungs:  Clear throughout to auscultation. Heart:  Regular rate and rhythm; no murmurs, rubs,  or bruits. Abdomen:  Soft, nontender and nondistended. No masses, hepatosplenomegaly or hernias noted. Normal bowel sounds. Rectal:  Normal exam.Anterior skin tag noted but no definite fissure or fistula noted. No rectal masses or tenderness with brown stool present which is guaiac positive. In fact it's markedly guaiac-positive. Prostate:  .normal size prostate.   Extremities:  No clubbing, cyanosis, edema or deformities noted. Neurologic:  Alert and  oriented x4;  grossly normal neurologically. Psych:  Alert  and cooperative. Normal mood and affect.   Impression & Recommendations:  Problem # 1:  RECTAL BLEEDING (ICD-569.3) Assessment Deteriorated Possible rectosigmoid polyp versus hemorrhoidal bleeding. Analmantle cream locally twice a day, stop NSAIDs, increase omeprazole to twice a day, scheduled colonoscopy, and check laboratory parameters. Orders: TLB-CBC Platelet - w/Differential (85025-CBCD) TLB-BMP (Basic Metabolic Panel-BMET) (80048-METABOL) TLB-Hepatic/Liver Function Pnl (80076-HEPATIC) TLB-TSH (Thyroid Stimulating Hormone) (84443-TSH) TLB-B12, Serum-Total ONLY ([address]-U04) TLB-Ferritin (82728-FER) TLB-Folic Acid (Folate) (82746-FOL) TLB-IBC Pnl (Iron/FE;Transferrin) (83550-IBC)  Problem # 2:  HYPERTENSION (ICD-401.9) Assessment: Improved blood pressure today normal at 134/78 and we will continue all his other cardiac medications.  Problem # 3:  Hx of GASTRITIS (ICD-535.50) Assessment: Improved increase omeprazole to 20 mg twice a day and avoid NSAIDs.  Patient Instructions: 1)  Please go to the basement for lab work. 2)  You are scheduled for a colonoscopy. 3)  Begin Analmantle cream two times a day. 4)  Increase omeprazole to two times a day. 5)  Stop all arthitits medications such as aleve or ibuprofen. 6)  The medication list was reviewed and reconciled.  All changed / newly prescribed medications were explained.  A complete medication list was provided to the patient / caregiver. 7)  Copy sent to : Dr. Alroy Dust... 8)  Please continue current medications.  9)  Colonoscopy and Flexible Sigmoidoscopy brochure given.  10)  Conscious Sedation brochure given.   Appended Document: Rectal Bleeding/dfs    Clinical  Lists Changes  Medications: Added new medication of MOVIPREP 100 GM  SOLR (PEG-KCL-NACL-NASULF-NA ASC-C) As per prep instructions. - Signed Changed medication from OMEPRAZOLE 40 MG CPDR (OMEPRAZOLE) Take 1 tablet by mouth once a day as needed to OMEPRAZOLE 40  MG CPDR (OMEPRAZOLE) Take 1 tablet by mouth twice a day - Signed Added new medication of ANALPRAM-HC SINGLES 1-2.5 %  CREA (HYDROCORTISONE ACE-PRAMOXINE) Apply to rectum 2-times per day Rx of MOVIPREP 100 GM  SOLR (PEG-KCL-NACL-NASULF-NA ASC-C) As per prep instructions.;  #1 x 0;  Signed;  Entered by: Ashok Cordia RN;  Authorized by: Mardella Layman MD [facility];  Method used: Electronically to [facility]. [phone]*, [address], [place], [place], [place]  [address], Ph: [phone], Fax: [phone] Rx of OMEPRAZOLE 40 MG CPDR (OMEPRAZOLE) Take 1 tablet by mouth twice a day;  #60 x 6;  Signed;  Entered by: Ashok Cordia RN;  Authorized by: Mardella Layman MD [facility];  Method used: Electronically to [facility]. [phone]*, [address] [place], [place], [place]  [address], Ph: [phone], Fax: [phone] Orders: Added new Test order of Colonoscopy (Colon) - Signed    Prescriptions: OMEPRAZOLE 40 MG CPDR (OMEPRAZOLE) Take 1 tablet by mouth twice a day  #60 x 6   Entered by:   Ashok Cordia RN   Authorized by:   Mardella Layman MD [facility]   Signed by:   Ashok Cordia RN on 05/24/2009   Method used:   Electronically to        [facility] [phone]* (retail)       [address]       [place], [place]  [address]       Ph: [phone]       Fax: [phone]   RxID:   [id number] MOVIPREP 100 GM  SOLR (PEG-KCL-NACL-NASULF-NA ASC-C) As per prep instructions.  #1 x 0   Entered by:   Ashok Cordia RN   Authorized by:   Mardella Layman MD [facility]   Signed by:   Ashok Cordia RN on 05/24/2009   Method used:   Electronically to        [facility] [phone]* (retail)       [address]       [place], [place]  [address]       Ph: [phone]       Fax: [phone]   RxID:   [id number]

## 2010-02-02 NOTE — Procedures (Signed)
Summary: EDG   EGD  Procedure date:  07/02/2005  Findings:      Location: [facility]   Patient Name: [name], [name]. MRN:  Procedure Procedures: Colonoscopy CPT: [id number].  Personnel: Endoscopist: Vania Rea. Jarold Motto, MD.  Exam Location: Exam performed in Outpatient Clinic. Outpatient  Patient Consent: Procedure, Alternatives, Risks and Benefits discussed, consent obtained, from patient. Consent was obtained by the RN.  Indications Symptoms: Constipation Patient has difficulty evacuating, strains with stool passage. Abdominal pain / bloating.  History  Current Medications: Patient is not currently taking Coumadin.  Pre-Exam Physical: Performed Jul 02, 2005. Cardio-pulmonary exam, Rectal exam, Abdominal exam, Extremity exam, Mental status exam WNL.  Comments: Pt. history reviewed/updated, physical exam performed prior to initiation of sedation? Exam Exam: Extent of exam reached: Cecum, extent intended: Cecum.  The cecum was identified by appendiceal orifice and IC valve. Patient position: on left side. The Cecum was reached at 8:23 AM. ended at 8:29 AM. Time for Withdrawl: 00:06. Colon retroflexion performed. Images taken. ASA Classification: I. Tolerance: excellent.  Monitoring: Pulse and BP monitoring, Oximetry used. Supplemental O2 given. at 2 Liters.  Colon Prep Used Golytely for colon prep. Prep results: excellent.  Sedation Meds: Patient assessed and found to be appropriate for moderate (conscious) sedation. Fentanyl 50 mcg. given IV. Versed 6 mg. given IV.  Instrument(s): CF 140L. Serial [id number].  Findings - NORMAL EXAM: Cecum to Rectum. Not Seen: Polyps. AVM's. Colitis. Tumors. Melanosis. Crohn's. Diverticulosis. Hemorrhoids.   Assessment Normal examination.  Comments: Chronic constipation predominant IBS.... Events  Unplanned Interventions: No intervention was required.  Plans Medication Plan: Continue current  medications.  Patient Education: Patient given standard instructions for: Constipation. Disposition: After procedure patient sent to recovery. After recovery patient sent home.  Scheduling/Referral: Follow-Up prn. EGD, to [facility]. Jarold Motto, MD, on Jul 02, 2005.    cc; Robert [name]. Robert Basque, MD      This report was created from the original endoscopy report, which was reviewed and signed by the above listed endoscopist.

## 2010-02-02 NOTE — Procedures (Signed)
Summary: Colon   Colonoscopy  Procedure date:  07/02/2005  Findings:      Location:  [facility].    Colonoscopy  Procedure date:  07/02/2005  Findings:      Location:  [facility].   Patient Name: Robert [name], Robert [name] MRN:  Procedure Procedures: Colonoscopy CPT: [phone].  Personnel: Endoscopist: Vania Rea. Jarold Motto, MD.  Exam Location: Exam performed in Outpatient Clinic. Outpatient  Patient Consent: Procedure, Alternatives, Risks and Benefits discussed, consent obtained, from patient. Consent was obtained by the RN.  Indications Symptoms: Constipation Patient has difficulty evacuating, strains with stool passage. Abdominal pain / bloating.  History  Current Medications: Patient is not currently taking Coumadin.  Pre-Exam Physical: Performed Jul 02, 2005. Cardio-pulmonary exam, Rectal exam, Abdominal exam, Extremity exam, Mental status exam WNL.  Comments: Pt. history reviewed/updated, physical exam performed prior to initiation of sedation? Exam Exam: Extent of exam reached: Cecum, extent intended: Cecum.  The cecum was identified by appendiceal orifice and IC valve. Patient position: on left side. The Cecum was reached at 8:23 AM. ended at 8:29 AM. Time for Withdrawl: 00:06. Colon retroflexion performed. Images taken. ASA Classification: I. Tolerance: excellent.  Monitoring: Pulse and BP monitoring, Oximetry used. Supplemental O2 given. at 2 Liters.  Colon Prep Used Golytely for colon prep. Prep results: excellent.  Sedation Meds: Patient assessed and found to be appropriate for moderate (conscious) sedation. Fentanyl 50 mcg. given IV. Versed 6 mg. given IV.  Instrument(s): CF 140L. Serial [id number].  Findings - NORMAL EXAM: Cecum to Rectum. Not Seen: Polyps. AVM's. Colitis. Tumors. Melanosis. Crohn's. Diverticulosis. Hemorrhoids.   Assessment Normal examination.  Comments: Chronic constipation predominant  IBS.... Events  Unplanned Interventions: No intervention was required.  Plans Medication Plan: Continue current medications.  Patient Education: Patient given standard instructions for: Constipation. Disposition: After procedure patient sent to recovery. After recovery patient sent home.  Scheduling/Referral: Follow-Up prn. EGD, to [facility]. Jarold Motto, MD, on Jul 02, 2005.    cc; Lonzo Cloud. Kriste Basque, MD    This report was created from the original endoscopy report, which was reviewed and signed by the above listed endoscopist.

## 2010-02-02 NOTE — Medication Information (Signed)
Summary: Tax adviser   Imported By: Valinda Hoar 04/21/2009 09:59:45  _____________________________________________________________________  External Attachment:    Type:   Image     Comment:   External Document

## 2010-02-02 NOTE — Progress Notes (Signed)
Summary: Blood in stolls/appt sooner than next available  Phone Note From Other Clinic   Caller: Rhonda @ Dr Kriste Basque Call For: Dr Jarold Motto Reason for Call: Schedule Patient Appt Summary of Call: Would like pt seen sooner than next available 06-28-09. Blood in stools. Saw Dr Jarold Motto last in 2007. Had Endo Colon 2007.Chart requested for review. Initial call taken by: Leanor Kail [facility],  May 23, 2009 3:19 PM  Follow-up for Phone Call        Appt sch for 05/24/20.  Rhonda notified. Follow-up by: Ashok Cordia RN,  May 23, 2009 3:50 PM

## 2010-02-02 NOTE — Letter (Signed)
Summary: [facility] Instructions  [facility]  [address] [place], [place] [address]   Phone: [phone]  Fax: [phone]       Robert [name]    [birth year]    MRN: [id number]        Procedure Day Dorna Bloom: Wednesday, 06/01/09     Arrival Time: 9:30      Procedure Time: 10:30     Location of Procedure:                    _X _  [facility] (4th Floor)  PREPARATION FOR COLONOSCOPY WITH MOVIPREP   Starting 5 days prior to your procedure 05/27/09 do not eat nuts, seeds, popcorn, corn, beans, peas,  salads, or any raw vegetables.  Do not take any fiber supplements (e.g. Metamucil, Citrucel, and Benefiber).  THE DAY BEFORE YOUR PROCEDURE         DATE: 05/31/09  DAY: Tuesday  1.  Drink clear liquids the entire day-NO SOLID FOOD  2.  Do not drink anything colored red or purple.  Avoid juices with pulp.  No orange juice.  3.  Drink at least 64 oz. (8 glasses) of fluid/clear liquids during the day to prevent dehydration and help the prep work efficiently.  CLEAR LIQUIDS INCLUDE: Water Jello Ice Popsicles Tea (sugar ok, no milk/cream) Powdered fruit flavored drinks Coffee (sugar ok, no milk/cream) Gatorade Juice: apple, white grape, white cranberry  Lemonade Clear bullion, consomm, broth Carbonated beverages (any kind) Strained chicken noodle soup Hard Candy                             4.  In the morning, mix first dose of MoviPrep solution:    Empty 1 Pouch A and 1 Pouch B into the disposable container    Add lukewarm drinking water to the top line of the container. Mix to dissolve    Refrigerate (mixed solution should be used within 24 hrs)  5.  Begin drinking the prep at 5:00 p.m. The MoviPrep container is divided by 4 marks.   Every 15 minutes drink the solution down to the next mark (approximately 8 oz) until the full liter is complete.   6.  Follow completed prep with 16 oz of clear liquid of your choice (Nothing red or purple).  Continue  to drink clear liquids until bedtime.  7.  Before going to bed, mix second dose of MoviPrep solution:    Empty 1 Pouch A and 1 Pouch B into the disposable container    Add lukewarm drinking water to the top line of the container. Mix to dissolve    Refrigerate  THE DAY OF YOUR PROCEDURE      DATE: 06/01/09   DAY: Tuesday  Beginning at 5:30 a.m. (5 hours before procedure):         1. Every 15 minutes, drink the solution down to the next mark (approx 8 oz) until the full liter is complete.  2. Follow completed prep with 16 oz. of clear liquid of your choice.    3. You may drink clear liquids until 8:30 (2 HOURS BEFORE PROCEDURE).   MEDICATION INSTRUCTIONS  Unless otherwise instructed, you should take regular prescription medications with a small sip of water   as early as possible the morning of your procedure.                 OTHER INSTRUCTIONS  You  will need a responsible adult at least 59 years of age to accompany you and drive you home.   This person must remain in the waiting room during your procedure.  Wear loose fitting clothing that is easily removed.  Leave jewelry and other valuables at home.  However, you may wish to bring a book to read or  an iPod/MP3 player to listen to music as you wait for your procedure to start.  Remove all body piercing jewelry and leave at home.  Total time from sign-in until discharge is approximately 2-3 hours.  You should go home directly after your procedure and rest.  You can resume normal activities the  day after your procedure.  The day of your procedure you should not:   Drive   Make legal decisions   Operate machinery   Drink alcohol   Return to work  You will receive specific instructions about eating, activities and medications before you leave.    The above instructions have been reviewed and explained to me by   _______________________    I fully understand and can verbalize these instructions  _____________________________ Date _________

## 2010-02-02 NOTE — Progress Notes (Signed)
Summary: Prior Auth for Levocetirizine----APPROVED 03/20/2009 to 04/20/2010  Phone Note Outgoing Call   Call placed by: Gweneth Dimitri RN,  April 19, 2009 2:42 PM Summary of Call: Prior Auth initiated with [employer] for Levocetirizine 5mg .  Awaiting Fax.  Case ID # [id number] Initial call taken by: Gweneth Dimitri RN,  April 19, 2009 2:44 PM  Follow-up for Phone Call        Form received and given to Leigh to have SN complete.  Gweneth Dimitri RN  April 19, 2009 3:29 PM  Approval form received for generic Xyzal.. Pharmacy aware.  Approval is from 03/20/2009 to 04/20/2010. Form has been scanned into the pt's chart. Michel Bickers CMA  April 21, 2009 9:28 AM

## 2010-02-02 NOTE — Procedures (Signed)
Summary: Colonoscopy  Patient: Robert [name] Note: All result statuses are Final unless otherwise noted.  Tests: (1) Colonoscopy (COL)   COL Colonoscopy           DONE     [facility]     [address]     [place], [place]  [address]           COLONOSCOPY PROCEDURE REPORT           PATIENT:  Robert, [name]  MR#:  [id number]     BIRTHDATE:  Dec 05, 1950, 59 yrs. old  GENDER:  male     ENDOSCOPIST:  Vania Rea. Jarold Motto, MD, [facility]     REF. BY:     PROCEDURE DATE:  06/01/2009     PROCEDURE:  Colonoscopy with biopsy, Colonoscopy with submucosal     injection     ASA CLASS:  Class II     INDICATIONS:  heme positive stool, hematochezia, family history of     colon cancer     MEDICATIONS:   Fentanyl 100 mcg IV, Versed 10 mg IV           DESCRIPTION OF PROCEDURE:   After the risks benefits and     alternatives of the procedure were thoroughly explained, informed     consent was obtained.  Digital rectal exam was performed and     revealed no abnormalities.   The LB CF-H180AL [id number] endoscope     was introduced through the anus and advanced to the cecum, which     was identified by both the appendix and ileocecal valve, without     limitations.  The quality of the prep was excellent, using     MoviPrep.  The instrument was then slowly withdrawn as the colon     was fully examined.     <<PROCEDUREIMAGES>>           FINDINGS:  A sessile polyp was found. LARGE MULTILOBULATED POLYP     AT HEPATIC FLEXURE DILTED.SEE PICTURES.TATOOED X 2 WITH [place]     INK.180 DEGREES APART.  This was otherwise a normal examination of     the colon.  External hemorrhoids were found.   Retroflexed views     in the rectum revealed no abnormalities.    The scope was then     withdrawn from the patient and the procedure completed.           COMPLICATIONS:  None     ENDOSCOPIC IMPRESSION:     1) Sessile polyp     2) Otherwise normal examination     3) External hemorrhoids     R/O CA IN VILLOUS  ADENOMA.     RECOMMENDATIONS:     1) Await biopsy results     SURGICAL REFERRAL FOR RESECTION.     REPEAT EXAM:  No           ______________________________     Vania Rea. Jarold Motto, MD, Clementeen Graham           CC:  Michele Mcalpine, MDWilson, Minerva Areola MD           n.     Rosalie DoctorMarland Kitchen   Vania Rea. Patterson at 06/01/2009 10:56 AM           [place], [place], [phone]  Note: An exclamation mark (!) indicates a result that was not dispersed into the flowsheet. Document Creation Date: 06/01/2009 10:57 AM _______________________________________________________________________  (1) Order result status: Final Collection or observation date-time: 06/01/2009  10:46 Requested date-time:  Receipt date-time:  Reported date-time:  Referring Physician:   Ordering Physician: Sheryn Bison [phone]) Specimen Source:  Source: Launa Grill Order Number: [id number] Lab site:   Appended Document: Colonoscopy    Clinical Lists Changes  Problems: Added new problem of COLONIC POLYPS (ICD-211.3) Orders: Added new Test order of [facility] ([facility]) - Signed      Appended Document: Colonoscopy     Procedures Next Due Date:    Colonoscopy: 06/2012

## 2010-02-02 NOTE — Consult Note (Signed)
Summary: [facility]   Imported By: Sherian Rein 10/18/2009 11:54:21  _____________________________________________________________________  External Attachment:    Type:   Image     Comment:   External Document

## 2010-02-02 NOTE — Progress Notes (Signed)
Summary: refill on meds-appt set  Phone Note Call from Patient   Caller: Patient--[phone] Reason for Call: Talk to Nurse Summary of Call: Patient asking for nurse to give him a call regarding med refiils--he does not understand why he needs an office visit.  I told by law we have to see the patient in the office to continue refiling meds.  Patient still wanting to speak w/ nurse Initial call taken by: Lehman Prom,  November 01, 2009 12:33 PM  Follow-up for Phone Call        Spoke with pt and advised he has to have a  an appt once a year to get meds refilled. I advised we can schedule his appt and I can refill his meds until the appt but that he must keep the appt to get  future refills. Pt set to see SN in Jan 2012. refills sent. Carron Curie CMA  November 01, 2009 2:15 PM     Prescriptions: GEMFIBROZIL 600 MG  TABS (GEMFIBROZIL) Take 1 tablet by mouth two times a day  #60 Each x 2   Entered by:   Carron Curie CMA   Authorized by:   Michele Mcalpine MD   Signed by:   Carron Curie CMA on 11/01/2009   Method used:   Electronically to        [facility] [phone]* (retail)       [address]       [place], [place]  [address]       Ph: [phone]       Fax: [phone]   RxID:   [id number] AMLODIPINE BESYLATE 10 MG TABS (AMLODIPINE BESYLATE) Take 1 tablet by mouth once a day  #30 Each x 2   Entered by:   Carron Curie CMA   Authorized by:   Michele Mcalpine MD   Signed by:   Carron Curie CMA on 11/01/2009   Method used:   Electronically to        [facility]. [phone]* (retail)       [address]       [place], [place]  [address]       Ph: [phone]       Fax: [phone]   RxID:   [id number] ATENOLOL 100 MG  TABS (ATENOLOL) Take 1 tablet by mouth once a day  #30 Each x 2   Entered by:   Carron Curie CMA   Authorized by:   Michele Mcalpine MD   Signed by:   Carron Curie  CMA on 11/01/2009   Method used:   Electronically to        [facility]. [phone]* (retail)       [address]       [place], [place]  [address]       Ph: [phone]       Fax: [phone]   RxID:   [id number]

## 2010-02-14 ENCOUNTER — Telehealth (INDEPENDENT_AMBULATORY_CARE_PROVIDER_SITE_OTHER): Payer: Self-pay | Admitting: *Deleted

## 2010-02-22 NOTE — Progress Notes (Signed)
Summary: prescription  Phone Note Call from Patient   Caller: Patient Call For: nadel Summary of Call: patient phoned stated that he has a lot of head congestion, scratchy throat, he has a yellowish nasal discharge. He would like to know if something can be called into [facility] on [address]. He can be reached at [phone] Initial call taken by: Vedia Coffer,  February 14, 2010 10:20 AM  Follow-up for Phone Call        Pt c/o head congestion for 3 days.  Sinus drainage (yellow).  Sorethroat.  Denies fever.  Please advise. Abigail Miyamoto RN  February 14, 2010 11:30 AM   Additional Follow-up for Phone Call Additional follow up Details #1::        mucinex DM two times a day as needed cough/congestion saline nasal rinses as needed  Please contact office for sooner follow up if symptoms do not improve or worsen  Zpack #1 take as directed--to have on hold if symptoms worsen with discolored mucus.  Additional Follow-up by: Rubye Oaks NP,  February 14, 2010 4:49 PM    Additional Follow-up for Phone Call Additional follow up Details #2::    Spoke with pt and notified of the above per TP.  Pt  verbalized understanding. Rx was sent to pharm.  Follow-up by: Vernie Murders,  February 14, 2010 4:54 PM  New/Updated Medications: ZITHROMAX Z-PAK 250 MG TABS (AZITHROMYCIN) take as directed Prescriptions: ZITHROMAX Z-PAK 250 MG TABS (AZITHROMYCIN) take as directed  #1 x 0   Entered by:   Vernie Murders   Authorized by:   Rubye Oaks NP   Signed by:   Vernie Murders on 02/14/2010   Method used:   Electronically to        [facility] [phone]* (retail)       [address]       [place], [place]  [address]       Ph: [phone]       Fax: [phone]   RxID:   [id number]

## 2010-03-18 LAB — BASIC METABOLIC PANEL
BUN: 6 mg/dL (ref 6–23)
BUN: 7 mg/dL (ref 6–23)
CO2: 29 mEq/L (ref 19–32)
Calcium: 8.6 mg/dL (ref 8.4–10.5)
Chloride: 107 mEq/L (ref 96–112)
Creatinine, Ser: 1.29 mg/dL (ref 0.4–1.5)
Creatinine, Ser: 1.44 mg/dL (ref 0.4–1.5)
GFR calc Af Amer: >60 mL/min (ref >60)
GFR calc Af Amer: >60 mL/min (ref >60)
GFR calc Af Amer: >60 mL/min (ref >60)
GFR calc non Af Amer: 50 mL/min — ABNORMAL LOW (ref >60)
GFR calc non Af Amer: 57 mL/min — ABNORMAL LOW (ref >60)
GFR calc non Af Amer: 58 mL/min — ABNORMAL LOW (ref >60)
Glucose, Bld: 120 mg/dL — ABNORMAL HIGH (ref 70–99)
Potassium: 3.5 mEq/L (ref 3.5–5.1)
Potassium: 3.9 mEq/L (ref 3.5–5.1)
Sodium: 138 mEq/L (ref 135–145)

## 2010-03-18 LAB — CBC
HCT: 34.6 % — ABNORMAL LOW (ref 39.0–52.0)
HCT: 35 % — ABNORMAL LOW (ref 39.0–52.0)
Hemoglobin: 11.9 g/dL — ABNORMAL LOW (ref 13.0–17.0)
MCH: 28.3 pg (ref 26.0–34.0)
MCHC: 33.9 g/dL (ref 30.0–36.0)
MCV: 82.7 fL (ref 78.0–100.0)
MCV: 83.3 fL (ref 78.0–100.0)
RDW: 13.3 % (ref 11.5–15.5)
RDW: 13.9 % (ref 11.5–15.5)
WBC: 7.4 10*3/uL (ref 4.0–10.5)

## 2010-03-18 LAB — ABO/RH: ABO/RH(D): A POS

## 2010-03-18 LAB — MAGNESIUM: Magnesium: 2 mg/dL (ref 1.5–2.5)

## 2010-03-19 LAB — DIFFERENTIAL
Basophils Relative: 0 % (ref 0–1)
Eosinophils Absolute: 0.3 10*3/uL (ref 0.0–0.7)
Eosinophils Relative: 11 % — ABNORMAL HIGH (ref 0–5)
Lymphocytes Relative: 37 % (ref 12–46)
Lymphs Abs: 1 10*3/uL (ref 0.7–4.0)

## 2010-03-19 LAB — CBC
HCT: 37.8 % — ABNORMAL LOW (ref 39.0–52.0)
MCHC: 34.2 g/dL (ref 30.0–36.0)
Platelets: 218 10*3/uL (ref 150–400)
RBC: 4.57 MIL/uL (ref 4.22–5.81)
RDW: 14 % (ref 11.5–15.5)
WBC: 2.7 10*3/uL — ABNORMAL LOW (ref 4.0–10.5)

## 2010-03-19 LAB — SURGICAL PCR SCREEN
MRSA, PCR: NEGATIVE
Staphylococcus aureus: POSITIVE — AB

## 2010-03-19 LAB — COMPREHENSIVE METABOLIC PANEL
ALT: 34 U/L (ref 0–53)
AST: 42 U/L — ABNORMAL HIGH (ref 0–37)
Chloride: 105 mEq/L (ref 96–112)
Creatinine, Ser: 1.42 mg/dL (ref 0.4–1.5)
Total Bilirubin: 0.7 mg/dL (ref 0.3–1.2)

## 2010-03-19 LAB — CEA: CEA: <0.5 ng/mL (ref 0.0–5.0)

## 2010-05-01 ENCOUNTER — Other Ambulatory Visit: Payer: Self-pay | Admitting: Pulmonary Disease

## 2010-05-18 ENCOUNTER — Telehealth: Payer: Self-pay | Admitting: *Deleted

## 2010-05-18 NOTE — Telephone Encounter (Signed)
PA initiated for Xyzal. Member # [id number]. Case # [id number]. Awaiting fax.

## 2010-05-18 NOTE — Telephone Encounter (Signed)
Form received and given to Leigh. 

## 2010-05-22 NOTE — Telephone Encounter (Signed)
SN has signed the PA for xyzal.  This PA has been faxed back.

## 2010-05-24 NOTE — Telephone Encounter (Signed)
Generic Xyzal  APPROVED from 05/01/2010 to 05/22/2011. [facility] notifed. Pt notified.

## 2010-07-27 ENCOUNTER — Telehealth: Payer: Self-pay | Admitting: Pulmonary Disease

## 2010-07-27 NOTE — Telephone Encounter (Signed)
LMTCB

## 2010-07-28 NOTE — Telephone Encounter (Signed)
Called spoke with patient's wife, who is requesting a rx for a MVI so that she and her husband can use flex spending to pay for it.  requesting a MVI that SN recommends though i informed patient that a centrum silver will work well.  Requesting to have this printed off and will pick up when ready.  Pt aware SN out of office this afternoon and is okay with Monday.   Dr Kriste Basque please advise, thanks!

## 2010-07-31 NOTE — Telephone Encounter (Signed)
rx has been sent to the pt via mail per pts request.

## 2010-09-18 ENCOUNTER — Telehealth: Payer: Self-pay | Admitting: Pulmonary Disease

## 2010-09-18 MED ORDER — AMOXICILLIN-POT CLAVULANATE 875-125 MG PO TABS: 1.0000 | ORAL_TABLET | Freq: Two times a day (BID) | ORAL | Status: AC

## 2010-09-18 NOTE — Telephone Encounter (Signed)
Spoke with pt and notified of recs per SN. Pt verbalized understanding and rx was sent to pharm.  

## 2010-09-18 NOTE — Telephone Encounter (Signed)
Spoke with pt. He thinks has sinus infection. He is c/o yellow nasal d/c, cough with yellow sputum, sinus pressure/HA x 2 days. No fever.  Would like rx called in- pls advise, thanks! Allergies  Allergen Reactions  . Atorvastatin     REACTION: INTOL to Stains w/ myalgias

## 2010-09-18 NOTE — Telephone Encounter (Signed)
Addended by: Christen Butter on: 09/18/2010 02:30 PM   Modules accepted: Orders

## 2010-09-18 NOTE — Telephone Encounter (Signed)
Per SN---ok for augmentin 875mg   #14  1 po bid and use mucinex 2 po bid with plenty of fluids and nasal saline mist. thanks

## 2010-10-29 ENCOUNTER — Other Ambulatory Visit: Payer: Self-pay | Admitting: Pulmonary Disease

## 2010-11-02 NOTE — Telephone Encounter (Signed)
Please advise if okay to refill alprazolam thanks 

## 2010-11-30 ENCOUNTER — Other Ambulatory Visit: Payer: Self-pay | Admitting: Pulmonary Disease

## 2011-02-01 ENCOUNTER — Other Ambulatory Visit: Payer: Self-pay | Admitting: Pulmonary Disease

## 2011-02-02 ENCOUNTER — Other Ambulatory Visit: Payer: Self-pay | Admitting: Pulmonary Disease

## 2011-02-19 ENCOUNTER — Other Ambulatory Visit: Payer: Self-pay | Admitting: Pulmonary Disease

## 2011-03-02 ENCOUNTER — Other Ambulatory Visit: Payer: Self-pay | Admitting: Pulmonary Disease

## 2011-03-07 ENCOUNTER — Ambulatory Visit (INDEPENDENT_AMBULATORY_CARE_PROVIDER_SITE_OTHER): Payer: BC Managed Care – PPO | Admitting: Pulmonary Disease

## 2011-03-07 ENCOUNTER — Other Ambulatory Visit (INDEPENDENT_AMBULATORY_CARE_PROVIDER_SITE_OTHER): Payer: BC Managed Care – PPO

## 2011-03-07 ENCOUNTER — Encounter: Payer: Self-pay | Admitting: Pulmonary Disease

## 2011-03-07 ENCOUNTER — Ambulatory Visit (INDEPENDENT_AMBULATORY_CARE_PROVIDER_SITE_OTHER)
Admission: RE | Admit: 2011-03-07 | Discharge: 2011-03-07 | Disposition: A | Discharge status: Home / Self Care | Payer: BC Managed Care – PPO | Source: Ambulatory Visit | Attending: Pulmonary Disease | Admitting: Pulmonary Disease

## 2011-03-07 VITALS — BP 160/92 | HR 54 | Temp 97.8°F | Ht 71.0 in | Wt 227.0 lb

## 2011-03-07 DIAGNOSIS — F411 Generalized anxiety disorder: Secondary | ICD-10-CM

## 2011-03-07 DIAGNOSIS — E785 Hyperlipidemia, unspecified: Secondary | ICD-10-CM

## 2011-03-07 DIAGNOSIS — Z0000 Encounter for general adult medical examination without abnormal findings: Secondary | ICD-10-CM

## 2011-03-07 DIAGNOSIS — IMO0001 Reserved for inherently not codable concepts without codable children: Secondary | ICD-10-CM

## 2011-03-07 DIAGNOSIS — I10 Essential (primary) hypertension: Secondary | ICD-10-CM

## 2011-03-07 DIAGNOSIS — D126 Benign neoplasm of colon, unspecified: Secondary | ICD-10-CM

## 2011-03-07 LAB — LIPID PANEL
Cholesterol: 151 mg/dL (ref 0–200)
LDL Cholesterol: 79 mg/dL (ref 0–99)
Triglycerides: 101 mg/dL (ref 0.0–149.0)

## 2011-03-07 LAB — CBC WITH DIFFERENTIAL/PLATELET
Basophils Relative: 0.8 % (ref 0.0–3.0)
Eosinophils Relative: 9.1 % — ABNORMAL HIGH (ref 0.0–5.0)
Lymphocytes Relative: 37 % (ref 12.0–46.0)
MCV: 83.4 fl (ref 78.0–100.0)
Monocytes Relative: 9.9 % (ref 3.0–12.0)
Neutrophils Relative %: 43.2 % (ref 43.0–77.0)
RBC: 4.9 Mil/uL (ref 4.22–5.81)
WBC: 4.2 10*3/uL — ABNORMAL LOW (ref 4.5–10.5)

## 2011-03-07 LAB — BASIC METABOLIC PANEL
BUN: 13 mg/dL (ref 6–23)
Chloride: 99 mEq/L (ref 96–112)
Creatinine, Ser: 1.3 mg/dL (ref 0.4–1.5)

## 2011-03-07 LAB — HEPATIC FUNCTION PANEL
ALT: 31 U/L (ref 0–53)
Albumin: 4.3 g/dL (ref 3.5–5.2)
Total Bilirubin: 0.5 mg/dL (ref 0.3–1.2)
Total Protein: 7.7 g/dL (ref 6.0–8.3)

## 2011-03-07 NOTE — Progress Notes (Signed)
Subjective:     Patient ID: Robert [name], male   DOB: Aug 08, 1950, 61 y.o.   MRN: [id number]  HPI 61 y/o BM here for a follow up visit and CPX... he has multiple medical problems as noted below...    ~  January 10, 2010:  Robert [name] saw some rectal blood 5/11 & saw DrPatterson for eval- subseq colonoscopy revealed a large multilobulated sessile polyp at hepatic flexure- bx= tubulovillous adenoma & he had a lap-assisted right hemicolectomy 7/11 by DrWilson... uneventful post-op recovery & back to normal now... BP controlled on Aten/ Amlod;  Chol has been good on diet+ Gemfib;  he uses Omep40 Prn for acid symptoms;  uses Alpraz 1/2 Prn as well...  ~  March 07, 2011:  Yearly ROV & CPX>  He has had a good year overall, no new complaints or concerns except he would like to get his Testosterone level checked with all he's heard on TV & such (it was normal)... I note that he has gained 18# to 225# today and BP is sl elev at 160/92, he has a home BP cuff but not checking; he exercises for 10-88min the the AM almost everyday but hasn't really been on a specific diet; we reviewed low sodium, low carb, weight reducing diet to help his BP w/o having to incr meds yet...    HBP> on Amlod10 & Aten100; BP= 160/92 & he denies CP, palpit, dizzy, SOB, edema, etc; he will monitor BP at home & work on wt reduction; ROV recheck 22mo...    Hyperlipid> on Gemfib600Bid; FLP showed TChol 151, TG 101, HDL 52, LDL 79...    GI> Hx Gastritis, IBS, Polyps> uses Prilosec20 prn; had large adenoma removed 5/11 w/ dysplasia & subseq resection (see below); he denies any GI symptoms & f/u colon due 5/14 per DrPatterson...    Anxiety> he uses Alprazolam 0.5mg - 1/2 tab 2-3 times per week for stress or to help him rest... CXR 3/13 showed normal heart size, clear lungs, NAD.Marland KitchenMarland Kitchen EKG 3/13 showed SBrady, rate50, WNL/ NAD... Labs 3/13:  FLP- at goals on Simva40;  Chems- essen WNL;  CBC- wnl;  TSH=2.58;  PSA=1.40;  Testos=537   PROBLEM LIST:     PHYSICAL EXAMINATION (ICD-V70.0) - on ASA 81mg /d... exercises regularly w/ elliptical machine, weights, etc... he had PNEUMOVAX in 2005 (age 3)... he gets the yearly Flu vaccine each autumn.  HYPERTENSION (ICD-401.9) - on ATENOLOL 100mg /d,  & NORVASC 10mg /d...  ~  1/12: BP= 128/82> taking meds regulaly & tolerating well; denies HA, fatigue, visual changes, CP, palipit, dizziness, syncope, dyspnea, edema, etc... ~  3/13: BP= 160/92 & he has gained 18# to 227#... We discussed options for adding meds now vs short term lifestyle changes- no salt, wt reducing diet + exercise (he would like to try the latter & will monitor BP at home & f/u here...  VENOUS INSUFFICIENCY (ICD-459.81) - he knows to elim sodium, elevate legs, wear support hose...   HYPERLIPIDEMIA, BORDERLINE (ICD-272.4) - INTOL to Statin meds... on GEMFIBRIZOL 600mg Bid w/ good control- his Chol has been over 300 in the past & intol to statins w/ incr musc enz. ~  FLP 7/08 (wt=218#) showed TChol 154, TG 98, HDL 44, LDL 90... ~  FLP 10/09 (wt=222#) showed TChol 183, TG 86, HDL 48, LDL 118... rec> same med + better diet. ~  FLP 10/10 (wt=216#) showed TChol 173, TG 99, HDL 53, LDL 101 ~  FLP 1/12 (wt=209#) on Gemfib600Bid showed TChol 154, TG 53, HDL 57,  LDL 87 ~  FLP 3/13 (wt=227#) on Gemfib600Bid showed TChol 151, TG 101, HDL 52, LDL 79  Hx of GASTRITIS (ICD-535.50) - on OMEPRAZOLE 40mg  Prn... denies nausea, vomiting, heartburn, diarrhea, constipation, blood in stool, abdominal pain, swelling, gas...  ~  EGD 7/07 by DrPatterson showed gastritis & duodenitis- HPylori neg...  IRRITABLE BOWEL SYNDROME (ICD-564.1) - Hx IBS w/ constipation...  COLONIC POLYPS (ICD-211.3) - s/p resection villous adenoma 7/11... ~  colonoscopy 7/07 by DrPatterson was WNL. ~  pt saw some rectal blood 5/11> eval DrPatterson w/ colonoscopy revealing a large multilobulated sessile polyp at hepatic flexure- bx= tubulovillous adenoma & he had a lap-assisted right  hemicolectomy 7/11 by DrWilson.... surg path showed high grade dysplasia, no cancer, neg nodes... DrPatterson plans f/u colon is 59yrs.  Hx of URINARY TRACT INFECTION (ICD-599.0) - he has been evaluated by DrWrenn in 2001 w/ prostatitis... he has some nocturia & we discussed strategies to decr this...  Hx of ERECTILE DYSFUNCTION, MILD (ICD-302.72) - on VIAGRA 100mg Prn... ~  Labs 1/12 showed PSA= 1.96 ~  3/13: Robert [name] wonders about Low-T; Testos level = 537;  PSA remains normal= 1.40  Hx of MYALGIA (ICD-729.1) - s/p eval from DrZieminski in 2005 & his CPK elevations were believed secondary to prev Statin Rx...  ANXIETY (ICD-300.00) - uses ALPRAZOLAM 0.5mg - 1/2 to 1 tab Tid Prn...   Past Surgical History  Procedure Date  . Appendectomy   . Vasectomy   . Colon surgery 07/2009    Dr. Neita Carp tubulovillous adenoma    Outpatient Encounter Prescriptions as of 03/07/2011  Medication Sig Dispense Refill  . ALPRAZolam (XANAX) 0.5 MG tablet TAKE 1/2 TO 1 TABLET BY MOUTH THREE TIMES DAILY AS NEEDED FOR NERVES  90 tablet  0  . amLODipine (NORVASC) 10 MG tablet TAKE ONE TABLET BY MOUTH DAILY  30 tablet  0  . aspirin 81 MG tablet Take 81 mg by mouth daily.      Marland Kitchen atenolol (TENORMIN) 100 MG tablet TAKE 1 TABLET BY MOUTH EVERY DAY  30 tablet  0  . gemfibrozil (LOPID) 600 MG tablet TAKE 1 TABLET BY MOUTH TWICE DAILY  60 tablet  0  . levocetirizine (XYZAL) 5 MG tablet TAKE 1 TABLET BY MOUTH EVERY MORNING  30 tablet  0  . Multiple Vitamins-Minerals (CENTRUM SILVER ULTRA MENS) TABS TAKE 1 TABLET BY MOUTH EVERY DAY  200 tablet  5  . VIAGRA 100 MG tablet TAKE AS DIRECTED  10 tablet  0    Allergies  Allergen Reactions  . Atorvastatin     REACTION: INTOL to Stains w/ myalgias    Current Medications, Allergies, Past Medical History, Past Surgical History, Family History, and Social History were reviewed in [employer] record.   Review of Systems    The patient denies fever,  chills, sweats, anorexia, fatigue, weakness, malaise, weight loss, sleep disorder, blurring, diplopia, eye irritation, eye discharge, vision loss, eye pain, photophobia, earache, ear discharge, tinnitus, decreased hearing, nasal congestion, nosebleeds, sore throat, hoarseness, chest pain, palpitations, syncope, dyspnea on exertion, orthopnea, PND, peripheral edema, cough, dyspnea at rest, excessive sputum, hemoptysis, wheezing, pleurisy, nausea, vomiting, diarrhea, constipation, change in bowel habits, abdominal pain, melena, hematochezia, jaundice, gas/bloating, indigestion/heartburn, dysphagia, odynophagia, dysuria, hematuria, urinary frequency, urinary hesitancy, nocturia, incontinence, back pain, joint pain, joint swelling, muscle cramps, muscle weakness, stiffness, arthritis, sciatica, restless legs, leg pain at night, leg pain with exertion, rash, itching, dryness, suspicious lesions, paralysis, paresthesias, seizures, tremors, vertigo, transient blindness, frequent falls, frequent  headaches, difficulty walking, depression, anxiety, memory loss, confusion, cold intolerance, heat intolerance, polydipsia, polyphagia, polyuria, unusual weight change, abnormal bruising, bleeding, enlarged lymph nodes, urticaria, allergic rash, hay fever, and recurrent infections.     Objective:   Physical Exam    WD, WN, 61 y/o BM in NAD... body habitus is mesomorphic... GENERAL:  Alert & oriented; pleasant & cooperative... HEENT:  [place]/AT, EOM-wnl, PERRLA, EACs-clear, TMs-wnl, NOSE-clear, THROAT-clear & wnl. NECK:  Supple w/ full ROM; no JVD; normal carotid impulses w/o bruits; no thyromegaly or nodules palpated; no lymphadenopathy. CHEST:  Clear to P & A; without wheezes/ rales/ or rhonchi. HEART:  Regular Rhythm; without murmurs/ rubs/ or gallops. ABDOMEN:  Soft & nontender; normal bowel sounds; no organomegaly or masses detected. RECTAL:  Neg - prostate 2+ & nontender w/o nodules; stool hematest neg. EXT:  without  deformities, mild arthritic changes; no varicose veins/ +venous insuffic/ no edema. NEURO:  CN's intact; motor testing normal; sensory testing normal; gait normal & balance OK. DERM:  No lesions noted; no rash etc...  RADIOLOGY DATA:  Reviewed in the EPIC EMR & discussed w/ the patient...    >>CXR 3/13 showed normal heart size, clear lungs, NAD...    >>EKG 3/13 showed SBrady, rate50, WNL/ NAD...  LABORATORY DATA:  Reviewed in the EPIC EMR & discussed w/ the patient...    >>Labs 3/13:  FLP- at goals on Simva40;  Chems- essen WNL;  CBC- wnl;  TSH=2.58;  PSA=1.40;  Testos=537   Assessment:     CPX>>  HBP>  BP is elev today in assoc w/ weight gain; rec no salt, wt reducing diet, continued exercise; monitor BP at home & keep record; ROV 40mo & call for questions in the interval...  HYPERLIPID>  On Gemfib600Bid and FLP looks ok; needs diet efforts as noted...  Hx Gastritis>  On Prilosec OTC as needed...  IBS/ Colon Polyps>  He had large dysplastic polyp removed & right hemicolectomy 5/11; due for f/u colon 3 yrs= 5/14...  GU> Hx prostatitis, ED>  We checked Testos level at his request- WNL.Marland KitchenMarland Kitchen  Anxiety>  Stable on prn Alpraz...     Plan:     Patient's Medications  New Prescriptions   No medications on file  Previous Medications   ALPRAZOLAM (XANAX) 0.5 MG TABLET    TAKE 1/2 TO 1 TABLET BY MOUTH THREE TIMES DAILY AS NEEDED FOR NERVES   AMLODIPINE (NORVASC) 10 MG TABLET    TAKE ONE TABLET BY MOUTH DAILY   ASPIRIN 81 MG TABLET    Take 81 mg by mouth daily.   ATENOLOL (TENORMIN) 100 MG TABLET    TAKE 1 TABLET BY MOUTH EVERY DAY   GEMFIBROZIL (LOPID) 600 MG TABLET    TAKE 1 TABLET BY MOUTH TWICE DAILY   LEVOCETIRIZINE (XYZAL) 5 MG TABLET    TAKE 1 TABLET BY MOUTH EVERY MORNING   MULTIPLE VITAMINS-MINERALS (CENTRUM SILVER ULTRA MENS) TABS    TAKE 1 TABLET BY MOUTH EVERY DAY   VIAGRA 100 MG TABLET    TAKE AS DIRECTED  Modified Medications   No medications on file  Discontinued  Medications   No medications on file

## 2011-03-07 NOTE — Patient Instructions (Signed)
Today we updated your med list in our EPIC system...    Continue your current medications the same...  Today we did your follow up CXR, EKG, & fasting blood work...    Please call the [facility]REE in a few days for your results...    Dial [id number] & when prompted enter your patient number followed by the # symbol...    Your patient number is:  [id number]#  Your BP was somewhat elev today assoc w/ some weight gain...    We may need in adjust or increase your meds, but we can give you a time interval to try to get the weight down & get back on track w/ your diet.  Please start monitoring your BP at home & keep a record of your readings & weights...    Remember to avoid salt/ sodium...    Call for any questions...  Let's plan a brief follow up visit in 3 months to check your progress

## 2011-03-08 LAB — URINALYSIS
Nitrite: NEGATIVE
Specific Gravity, Urine: 1.01 (ref 1.000–1.030)
Total Protein, Urine: NEGATIVE
Urine Glucose: NEGATIVE
Urobilinogen, UA: 0.2 (ref 0.0–1.0)

## 2011-03-08 LAB — PSA: PSA: 1.4 ng/mL (ref 0.10–4.00)

## 2011-03-08 LAB — TSH: TSH: 2.58 u[IU]/mL (ref 0.35–5.50)

## 2011-03-29 ENCOUNTER — Other Ambulatory Visit: Payer: Self-pay | Admitting: Pulmonary Disease

## 2011-05-28 ENCOUNTER — Other Ambulatory Visit: Payer: Self-pay | Admitting: Pulmonary Disease

## 2011-06-13 ENCOUNTER — Encounter: Payer: Self-pay | Admitting: Pulmonary Disease

## 2011-06-13 ENCOUNTER — Ambulatory Visit (INDEPENDENT_AMBULATORY_CARE_PROVIDER_SITE_OTHER): Payer: BC Managed Care – PPO | Admitting: Pulmonary Disease

## 2011-06-13 VITALS — BP 150/90 | HR 51 | Temp 96.9°F | Ht 71.0 in | Wt 222.8 lb

## 2011-06-13 DIAGNOSIS — K2970 Gastritis, unspecified, without bleeding: Secondary | ICD-10-CM

## 2011-06-13 DIAGNOSIS — I872 Venous insufficiency (chronic) (peripheral): Secondary | ICD-10-CM

## 2011-06-13 DIAGNOSIS — E785 Hyperlipidemia, unspecified: Secondary | ICD-10-CM

## 2011-06-13 DIAGNOSIS — I10 Essential (primary) hypertension: Secondary | ICD-10-CM

## 2011-06-13 DIAGNOSIS — K589 Irritable bowel syndrome without diarrhea: Secondary | ICD-10-CM

## 2011-06-13 DIAGNOSIS — F411 Generalized anxiety disorder: Secondary | ICD-10-CM

## 2011-06-13 DIAGNOSIS — K2990 Gastroduodenitis, unspecified, without bleeding: Secondary | ICD-10-CM

## 2011-06-13 NOTE — Progress Notes (Signed)
Subjective:     Patient ID: Robert [name], male   DOB: 05-Jun-1950, 61 y.o.   MRN: [id number]  HPI 61 y/o BM here for a follow up visit and CPX... he has multiple medical problems as noted below...    ~  January 10, 2010:  [name] saw some rectal blood 5/11 & saw DrPatterson for eval- subseq colonoscopy revealed a large multilobulated sessile polyp at hepatic flexure- bx= tubulovillous adenoma & he had a lap-assisted right hemicolectomy 7/11 by DrWilson... uneventful post-op recovery & back to normal now... BP controlled on Aten/ Amlod;  Chol has been good on diet+ Gemfib;  he uses Omep40 Prn for acid symptoms;  uses Alpraz 1/2 Prn as well...  ~  March 07, 2011:  Yearly ROV & CPX>  He has had a good year overall, no new complaints or concerns except he would like to get his Testosterone level checked with all he's heard on TV & such (it was normal)... I note that he has gained 18# to 225# today and BP is sl elev at 160/92, he has a home BP cuff but not checking; he exercises for 10-53min the the AM almost everyday but hasn't really been on a specific diet; we reviewed low sodium, low carb, weight reducing diet to help his BP w/o having to incr meds yet...    HBP> on Amlod10 & Aten100; BP= 160/92 & he denies CP, palpit, dizzy, SOB, edema, etc; he will monitor BP at home & work on wt reduction; ROV recheck 56mo...    Hyperlipid> on Gemfib600Bid; FLP showed TChol 151, TG 101, HDL 52, LDL 79...    GI> Hx Gastritis, IBS, Polyps> uses Prilosec20 prn; had large adenoma removed 5/11 w/ dysplasia & subseq resection (see below); he denies any GI symptoms & f/u colon due 5/14 per DrPatterson...    Anxiety> he uses Alprazolam 0.5mg - 1/2 tab 2-3 times per week for stress or to help him rest... CXR 3/13 showed normal heart size, clear lungs, NAD.Marland KitchenMarland Kitchen EKG 3/13 showed SBrady, rate50, WNL/ NAD... Labs 3/13:  FLP- at goals on Simva40;  Chems- essen WNL;  CBC- wnl;  TSH=2.58;  PSA=1.40;  Testos=537  ~  June 13, 2011:  56mo  ROV & Alvin's BP at home is usually in the 130-140/70-80 range he says; here it reads 150/90 but he's had a stressful day; unfortunately he didn't bring his BP log or his machine to compare; he has lost 4# & says he is avoiding sodium etc;  I asked him again to check BP at diff times & keep a log of readings for [place] to review- he will call if readings are >150/90... We reviewed prob list, meds, xrays and labs> see below>>   PROBLEM LIST:    PHYSICAL EXAMINATION (ICD-V70.0) - on ASA 81mg /d... exercises regularly w/ elliptical machine, weights, etc... he had PNEUMOVAX in 2005 (age 28)... he gets the yearly Flu vaccine each autumn.  HYPERTENSION (ICD-401.9) - on ATENOLOL 100mg /d,  & NORVASC 10mg /d...  ~  1/12: BP= 128/82> taking meds regulaly & tolerating well; denies HA, fatigue, visual changes, CP, palipit, dizziness, syncope, dyspnea, edema, etc... ~  3/13: BP= 160/92 & he has gained 18# to 227#... We discussed options for adding meds now vs short term lifestyle changes- no salt, wt reducing diet + exercise (he would like to try the latter & will monitor BP at home & f/u here... ~  6/13:  BP=150/90 after a stressful day etc; he takes his Aten/ Amlod regularly &  tol well; denies CP, palpit, SOB, edema...  VENOUS INSUFFICIENCY (ICD-459.81) - he knows to elim sodium, elevate legs, wear support hose...   HYPERLIPIDEMIA, BORDERLINE (ICD-272.4) - INTOL to Statin meds... on GEMFIBRIZOL 600mg Bid w/ good control- his Chol has been over 300 in the past & intol to statins w/ incr musc enz. ~  FLP 7/08 (wt=218#) showed TChol 154, TG 98, HDL 44, LDL 90... ~  FLP 10/09 (wt=222#) showed TChol 183, TG 86, HDL 48, LDL 118... rec> same med + better diet. ~  FLP 10/10 (wt=216#) showed TChol 173, TG 99, HDL 53, LDL 101 ~  FLP 1/12 (wt=209#) on Gemfib600Bid showed TChol 154, TG 53, HDL 57, LDL 87 ~  FLP 3/13 (wt=227#) on Gemfib600Bid showed TChol 151, TG 101, HDL 52, LDL 79  Hx of GASTRITIS (ICD-535.50) - on  OMEPRAZOLE 40mg  Prn... denies nausea, vomiting, heartburn, diarrhea, constipation, blood in stool, abdominal pain, swelling, gas...  ~  EGD 7/07 by DrPatterson showed gastritis & duodenitis- HPylori neg...  IRRITABLE BOWEL SYNDROME (ICD-564.1) - Hx IBS w/ constipation...  COLONIC POLYPS (ICD-211.3) - s/p resection villous adenoma 7/11... ~  colonoscopy 7/07 by DrPatterson was WNL. ~  pt saw some rectal blood 5/11> eval DrPatterson w/ colonoscopy revealing a large multilobulated sessile polyp at hepatic flexure- bx= tubulovillous adenoma & he had a lap-assisted right hemicolectomy 7/11 by DrWilson.... surg path showed high grade dysplasia, no cancer, neg nodes... DrPatterson plans f/u colon is 44yrs.  Hx of URINARY TRACT INFECTION (ICD-599.0) - he has been evaluated by DrWrenn in 2001 w/ prostatitis... he has some nocturia & we discussed strategies to decr this...  Hx of ERECTILE DYSFUNCTION, MILD (ICD-302.72) - on VIAGRA 100mg Prn... ~  Labs 1/12 showed PSA= 1.96 ~  3/13: Sir wonders about Low-T; Testos level = 537;  PSA remains normal= 1.40  Hx of MYALGIA (ICD-729.1) - s/p eval from DrZieminski in 2005 & his CPK elevations were believed secondary to prev Statin Rx...  ANXIETY (ICD-300.00) - uses ALPRAZOLAM 0.5mg - 1/2 to 1 tab Tid Prn...   Past Surgical History  Procedure Date  . Appendectomy   . Vasectomy   . Colon surgery 07/2009    Dr. Neita Carp tubulovillous adenoma    Outpatient Encounter Prescriptions as of 06/13/2011  Medication Sig Dispense Refill  . ALPRAZolam (XANAX) 0.5 MG tablet TAKE 1/2 - 1 TABLET BY MOUTH THREE TIMES DAILY AS NEEDED FOR NERVES  90 tablet  5  . amLODipine (NORVASC) 10 MG tablet TAKE ONE TABLET BY MOUTH DAILY  30 tablet  6  . aspirin 81 MG tablet Take 81 mg by mouth daily.      Marland Kitchen atenolol (TENORMIN) 100 MG tablet TAKE 1 TABLET BY MOUTH EVERY DAY  30 tablet  6  . gemfibrozil (LOPID) 600 MG tablet TAKE 1 TABLET BY MOUTH TWICE DAILY  60 tablet  6  .  levocetirizine (XYZAL) 5 MG tablet TAKE 1 TABLET BY MOUTH EVERY MORNING  30 tablet  0  . Multiple Vitamins-Minerals (CENTRUM SILVER ULTRA MENS) TABS TAKE 1 TABLET BY MOUTH EVERY DAY  200 tablet  5  . VIAGRA 100 MG tablet TAKE AS DIRECTED  10 tablet  0    Allergies  Allergen Reactions  . Atorvastatin     REACTION: INTOL to Stains w/ myalgias    Current Medications, Allergies, Past Medical History, Past Surgical History, Family History, and Social History were reviewed in [employer] record.   Review of Systems    The patient denies  fever, chills, sweats, anorexia, fatigue, weakness, malaise, weight loss, sleep disorder, blurring, diplopia, eye irritation, eye discharge, vision loss, eye pain, photophobia, earache, ear discharge, tinnitus, decreased hearing, nasal congestion, nosebleeds, sore throat, hoarseness, chest pain, palpitations, syncope, dyspnea on exertion, orthopnea, PND, peripheral edema, cough, dyspnea at rest, excessive sputum, hemoptysis, wheezing, pleurisy, nausea, vomiting, diarrhea, constipation, change in bowel habits, abdominal pain, melena, hematochezia, jaundice, gas/bloating, indigestion/heartburn, dysphagia, odynophagia, dysuria, hematuria, urinary frequency, urinary hesitancy, nocturia, incontinence, back pain, joint pain, joint swelling, muscle cramps, muscle weakness, stiffness, arthritis, sciatica, restless legs, leg pain at night, leg pain with exertion, rash, itching, dryness, suspicious lesions, paralysis, paresthesias, seizures, tremors, vertigo, transient blindness, frequent falls, frequent headaches, difficulty walking, depression, anxiety, memory loss, confusion, cold intolerance, heat intolerance, polydipsia, polyphagia, polyuria, unusual weight change, abnormal bruising, bleeding, enlarged lymph nodes, urticaria, allergic rash, hay fever, and recurrent infections.     Objective:   Physical Exam    WD, WN, 61 y/o BM in NAD... body habitus  is mesomorphic... GENERAL:  Alert & oriented; pleasant & cooperative... HEENT:  [place]/AT, EOM-wnl, PERRLA, EACs-clear, TMs-wnl, NOSE-clear, THROAT-clear & wnl. NECK:  Supple w/ full ROM; no JVD; normal carotid impulses w/o bruits; no thyromegaly or nodules palpated; no lymphadenopathy. CHEST:  Clear to P & A; without wheezes/ rales/ or rhonchi. HEART:  Regular Rhythm; without murmurs/ rubs/ or gallops. ABDOMEN:  Soft & nontender; normal bowel sounds; no organomegaly or masses detected. RECTAL:  Neg - prostate 2+ & nontender w/o nodules; stool hematest neg. EXT:  without deformities, mild arthritic changes; no varicose veins/ +venous insuffic/ no edema. NEURO:  CN's intact; motor testing normal; sensory testing normal; gait normal & balance OK. DERM:  No lesions noted; no rash etc...  RADIOLOGY DATA:  Reviewed in the EPIC EMR & discussed w/ the patient...    >>CXR 3/13 showed normal heart size, clear lungs, NAD...    >>EKG 3/13 showed SBrady, rate50, WNL/ NAD...  LABORATORY DATA:  Reviewed in the EPIC EMR & discussed w/ the patient...    >>Labs 3/13:  FLP- at goals on Simva40;  Chems- essen WNL;  CBC- wnl;  TSH=2.58;  PSA=1.40;  Testos=537   Assessment:     HBP>  He really doesn't want to incr meds if he doesn't have to- BP borderline here after a stressful day; BP at home are better & he will check BPs & record on a grid for review & to check trends; call for questions, continue low sodium etc...  HYPERLIPID>  On Gemfib600Bid and FLP looks ok; needs diet efforts as noted...  Hx Gastritis>  On Prilosec OTC as needed...  IBS/ Colon Polyps>  He had large dysplastic polyp removed & right hemicolectomy 5/11; due for f/u colon 3 yrs= 5/14...  GU> Hx prostatitis, ED>  We checked Testos level at his request- WNL.Marland KitchenMarland Kitchen  Anxiety>  Stable on prn Alpraz...     Plan:     Patient's Medications  New Prescriptions   No medications on file  Previous Medications   ALPRAZOLAM (XANAX) 0.5 MG TABLET     TAKE 1/2 - 1 TABLET BY MOUTH THREE TIMES DAILY AS NEEDED FOR NERVES   AMLODIPINE (NORVASC) 10 MG TABLET    TAKE ONE TABLET BY MOUTH DAILY   ASPIRIN 81 MG TABLET    Take 81 mg by mouth daily.   ATENOLOL (TENORMIN) 100 MG TABLET    TAKE 1 TABLET BY MOUTH EVERY DAY   GEMFIBROZIL (LOPID) 600 MG TABLET    TAKE 1  TABLET BY MOUTH TWICE DAILY   LEVOCETIRIZINE (XYZAL) 5 MG TABLET    TAKE 1 TABLET BY MOUTH EVERY MORNING   MULTIPLE VITAMINS-MINERALS (CENTRUM SILVER ULTRA MENS) TABS    TAKE 1 TABLET BY MOUTH EVERY DAY   VIAGRA 100 MG TABLET    TAKE AS DIRECTED  Modified Medications   No medications on file  Discontinued Medications   No medications on file

## 2011-06-13 NOTE — Patient Instructions (Addendum)
Today we updated your med list in our EPIC system...    Continue your current medications the same...  As we discussed> keep a diary of your BP readings & bring the list + your machine to your f/u visits...  Call for any questions...  Let's plan a follow up visit in  6 months.Marland KitchenMarland Kitchen

## 2011-06-20 ENCOUNTER — Other Ambulatory Visit: Payer: Self-pay | Admitting: Pulmonary Disease

## 2011-06-21 ENCOUNTER — Telehealth: Payer: Self-pay | Admitting: Pulmonary Disease

## 2011-06-21 MED ORDER — AMOXICILLIN-POT CLAVULANATE 875-125 MG PO TABS: 1.0000 | ORAL_TABLET | Freq: Two times a day (BID) | ORAL | Status: AC

## 2011-06-21 MED ORDER — LEVOCETIRIZINE DIHYDROCHLORIDE 5 MG PO TABS: 5.0000 mg | ORAL_TABLET | Freq: Every evening | ORAL | Status: DC

## 2011-06-21 NOTE — Telephone Encounter (Signed)
I spoke with pt and c/o nasal congestion, runny nose, blowing out clear to yellow phlem, PND, very little sore throat x 2 days. Denies any f/c/s/n/v. Pt has been taking mucinex and alk-seltzer plus. Also pt is requesting a refill on xyzal 5 mg--last refilled 12/2010 #30 X 0 REFILLS. Please advise SN thanks  Allergies  Allergen Reactions  . Atorvastatin     REACTION: INTOL to Stains w/ myalgias

## 2011-06-21 NOTE — Telephone Encounter (Signed)
Per SN---ok for the xyzal  5mg   #30  1 daily with 5 refills, this has been sent in to the pharmacy.  Use mucinex 2 po bid with plenty of fluids, nasal saline spray every 1-2 hours while awake, and call in augmentin 875mg   #14  1 po bid until gone.  Pt is aware of SN recs and that this abx has been sent to the pharmacy.

## 2011-07-17 ENCOUNTER — Telehealth: Payer: Self-pay | Admitting: Pulmonary Disease

## 2011-07-17 MED ORDER — METHYLPREDNISOLONE 4 MG PO KIT: PACK | ORAL | Status: AC

## 2011-07-17 MED ORDER — LEVOFLOXACIN 500 MG PO TABS: 500.0000 mg | ORAL_TABLET | Freq: Every day | ORAL | Status: AC

## 2011-07-17 NOTE — Telephone Encounter (Signed)
Per phone msg from 06/21/11 pt called in for sinus issues:  Robert [name], [facility] 06/21/2011 12:24 PM Signed  Per SN---ok for the xyzal 5mg  #30 1 daily with 5 refills, this has been sent in to the pharmacy. Use mucinex 2 po bid with plenty of fluids, nasal saline spray every 1-2 hours while awake, and call in augmentin 875mg  #14 1 po bid until gone. Pt is aware of SN recs and that this abx has been sent to the pharmacy.  ----  Called, spoke with pt who reports he did finish the augmentin.  Symptoms did improve but never completely resolved.  C/o still having problems with sinuses - head and nasal congestion with a lot yellow mucus and having soreness around left eye.  Using saline nasal spray "some but not much" and alternating with robitussin dm and mucinex dm.  Requesting further recs -- Dr. Kriste Basque, pls advise. Thank you.  [facility]  Allergies verified:  Allergies  Allergen Reactions  . Atorvastatin     REACTION: INTOL to Stains w/ myalgias

## 2011-07-17 NOTE — Telephone Encounter (Signed)
Per SN-continue Mucinex 2 po bid and increase fluids, Levaquin 500 mg #7 take 1 po qd no refills, and Medrol dose pak #1 take as directed no refills, and use Align qd.

## 2011-07-17 NOTE — Telephone Encounter (Signed)
Pt returned call.  He is aware of Dr. Jodelle Green recs.  Advised Levaquin and Medrol dosepak have been sent to [facility].  He verbalized understanding of these instructions and voiced no further questions/concerns at this time. Pt is aware to call back if symptoms do not improve or worsen.

## 2011-07-17 NOTE — Telephone Encounter (Signed)
lmomtcb to inform pt of SN's recs - rxs have been sent to pharm

## 2011-08-04 ENCOUNTER — Other Ambulatory Visit: Payer: Self-pay | Admitting: Pulmonary Disease

## 2011-10-26 ENCOUNTER — Other Ambulatory Visit: Payer: Self-pay | Admitting: Pulmonary Disease

## 2011-11-24 ENCOUNTER — Other Ambulatory Visit: Payer: Self-pay | Admitting: Pulmonary Disease

## 2011-12-17 ENCOUNTER — Encounter: Payer: Self-pay | Admitting: *Deleted

## 2011-12-18 ENCOUNTER — Encounter: Payer: Self-pay | Admitting: Pulmonary Disease

## 2011-12-18 ENCOUNTER — Other Ambulatory Visit: Payer: Self-pay | Admitting: Pulmonary Disease

## 2011-12-18 ENCOUNTER — Ambulatory Visit (INDEPENDENT_AMBULATORY_CARE_PROVIDER_SITE_OTHER): Payer: BC Managed Care – PPO | Admitting: Pulmonary Disease

## 2011-12-18 VITALS — BP 144/86 | HR 55 | Temp 97.9°F | Ht 71.0 in | Wt 225.8 lb

## 2011-12-18 DIAGNOSIS — I872 Venous insufficiency (chronic) (peripheral): Secondary | ICD-10-CM

## 2011-12-18 DIAGNOSIS — F411 Generalized anxiety disorder: Secondary | ICD-10-CM

## 2011-12-18 DIAGNOSIS — E785 Hyperlipidemia, unspecified: Secondary | ICD-10-CM

## 2011-12-18 DIAGNOSIS — I10 Essential (primary) hypertension: Secondary | ICD-10-CM

## 2011-12-18 DIAGNOSIS — IMO0001 Reserved for inherently not codable concepts without codable children: Secondary | ICD-10-CM

## 2011-12-18 MED ORDER — LOSARTAN POTASSIUM 100 MG PO TABS: 100.0000 mg | ORAL_TABLET | Freq: Every day | ORAL | Status: DC

## 2011-12-18 MED ORDER — GEMFIBROZIL 600 MG PO TABS: 600.0000 mg | ORAL_TABLET | Freq: Two times a day (BID) | ORAL | Status: DC

## 2011-12-18 MED ORDER — LEVOCETIRIZINE DIHYDROCHLORIDE 5 MG PO TABS: 5.0000 mg | ORAL_TABLET | Freq: Every evening | ORAL | Status: DC

## 2011-12-18 MED ORDER — ATENOLOL 100 MG PO TABS: 100.0000 mg | ORAL_TABLET | Freq: Every day | ORAL | Status: DC

## 2011-12-18 MED ORDER — SILDENAFIL CITRATE 100 MG PO TABS: ORAL_TABLET | ORAL | Status: DC

## 2011-12-18 MED ORDER — ALPRAZOLAM 0.5 MG PO TABS: ORAL_TABLET | ORAL | Status: DC

## 2011-12-18 MED ORDER — AMLODIPINE BESYLATE 10 MG PO TABS: 10.0000 mg | ORAL_TABLET | Freq: Every day | ORAL | Status: DC

## 2011-12-18 NOTE — Patient Instructions (Addendum)
Today we updated your med list in our EPIC system...    Continue your current medications the same...  Today we decided to add an additional BP med>>    Start LOSARTAN 100mg  one tab daily...  Continue to monitor your BP at home & call for any problems or questions...  Let's plan a follow up visit in 3-31months for your Physical w/ labs etc at that time.Marland KitchenMarland Kitchen

## 2011-12-18 NOTE — Progress Notes (Signed)
Subjective:     Patient ID: Robert [name], male   DOB: 02-03-1950, 61 y.o.   MRN: [id number]  HPI 61 y/o BM here for a follow up visit and CPX... he has multiple medical problems as noted below...    ~  January 10, 2010:  Robert [name] saw some rectal blood 5/11 & saw DrPatterson for eval- subseq colonoscopy revealed a large multilobulated sessile polyp at hepatic flexure- bx= tubulovillous adenoma & he had a lap-assisted right hemicolectomy 7/11 by DrWilson... uneventful post-op recovery & back to normal now... BP controlled on Aten/ Amlod;  Chol has been good on diet+ Gemfib;  he uses Omep40 Prn for acid symptoms;  uses Alpraz 1/2 Prn as well...  ~  March 07, 2011:  Yearly ROV & CPX>  He has had a good year overall, no new complaints or concerns except he would like to get his Testosterone level checked with all he's heard on TV & such (it was normal)... I note that he has gained 18# to 225# today and BP is sl elev at 160/92, he has a home BP cuff but not checking; he exercises for 10-49min the the AM almost everyday but hasn't really been on a specific diet; we reviewed low sodium, low carb, weight reducing diet to help his BP w/o having to incr meds yet...    HBP> on Amlod10 & Aten100; BP= 160/92 & he denies CP, palpit, dizzy, SOB, edema, etc; he will monitor BP at home & work on wt reduction; ROV recheck 668mo...    Hyperlipid> on Gemfib600Bid; FLP showed TChol 151, TG 101, HDL 52, LDL 79...    GI> Hx Gastritis, IBS, Polyps> uses Prilosec20 prn; had large adenoma removed 5/11 w/ dysplasia & subseq resection (see below); he denies any GI symptoms & f/u colon due 5/14 per DrPatterson...    Anxiety> he uses Alprazolam 0.5mg - 1/2 tab 2-3 times per week for stress or to help him rest... CXR 3/13 showed normal heart size, clear lungs, NAD.Marland KitchenMarland Kitchen EKG 3/13 showed SBrady, rate50, WNL/ NAD... Labs 3/13:  FLP- at goals on Simva40;  Chems- essen WNL;  CBC- wnl;  TSH=2.58;  PSA=1.40;  Testos=537  ~  June 13, 2011:  668mo  ROV & Robert [name]'s BP at home is usually in the 130-140/70-80 range he says; here it reads 150/90 but he's had a stressful day; unfortunately he didn't bring his BP log or his machine to compare; he has lost 4# & says he is avoiding sodium etc;  I asked him again to check BP at diff times & keep a log of readings for [place] to review- he will call if readings are >150/90... We reviewed prob list, meds, xrays and labs> see below>>  ~  December 18, 2011:  68mo ROV & Robert [name] is c/o incr BP readings at home- running 130/80 - 150s/100; he denies HA, visual symptoms, CP, palit, SOB, edema; BP= 144/86 today & we decided to add LOSARTAN 100mg /d & continue to monitor...  Stable on Lopid & otherw w/o complaints or concerns...    We reviewed prob list, meds, xrays and labs> see below for updates >>          PROBLEM LIST:    PHYSICAL EXAMINATION (ICD-V70.0) - on ASA 81mg /d... exercises regularly w/ elliptical machine, weights, etc... he had PNEUMOVAX in 2005 (age 53)... he gets the yearly Flu vaccine each autumn.  HYPERTENSION (ICD-401.9) - on ATENOLOL 100mg /d,  & NORVASC 10mg /d...  ~  1/12: BP= 128/82> taking meds regulaly &  tolerating well; denies HA, fatigue, visual changes, CP, palipit, dizziness, syncope, dyspnea, edema, etc... ~  3/13: BP= 160/92 & he has gained 18# to 227#... We discussed options for adding meds now vs short term lifestyle changes- no salt, wt reducing diet + exercise (he would like to try the latter & will monitor BP at home & f/u here... ~  3/13: CXR 3/13 showed normal heart size, clear lungs, NAD.Marland KitchenMarland Kitchen EKG 3/13 showed SBrady, rate50, WNL/ NAD... ~  6/13:  BP=150/90 after a stressful day etc; he takes his Aten/ Amlod regularly & tol well; denies CP, palpit, SOB, edema... ~  12/13: BP= 144/86 but has run up to 150/100 at home; we decided to add LOSARTAN 100mg /d...  VENOUS INSUFFICIENCY (ICD-459.81) - he knows to elim sodium, elevate legs, wear support hose...   HYPERLIPIDEMIA, BORDERLINE  (ICD-272.4) - INTOL to Statin meds... on GEMFIBRIZOL 600mg Bid w/ good control- his Chol has been over 300 in the past & intol to statins w/ incr musc enz. ~  FLP 7/08 (wt=218#) showed TChol 154, TG 98, HDL 44, LDL 90... ~  FLP 10/09 (wt=222#) showed TChol 183, TG 86, HDL 48, LDL 118... rec> same med + better diet. ~  FLP 10/10 (wt=216#) showed TChol 173, TG 99, HDL 53, LDL 101 ~  FLP 1/12 (wt=209#) on Gemfib600Bid showed TChol 154, TG 53, HDL 57, LDL 87 ~  FLP 3/13 (wt=227#) on Gemfib600Bid showed TChol 151, TG 101, HDL 52, LDL 79  Hx of GASTRITIS (ICD-535.50) - on OMEPRAZOLE 40mg  Prn... denies nausea, vomiting, heartburn, diarrhea, constipation, blood in stool, abdominal pain, swelling, gas...  ~  EGD 7/07 by DrPatterson showed gastritis & duodenitis- HPylori neg...  IRRITABLE BOWEL SYNDROME (ICD-564.1) - Hx IBS w/ constipation...  COLONIC POLYPS (ICD-211.3) - s/p resection villous adenoma 7/11... ~  colonoscopy 7/07 by DrPatterson was WNL. ~  pt saw some rectal blood 5/11> eval DrPatterson w/ colonoscopy revealing a large multilobulated sessile polyp at hepatic flexure- bx= tubulovillous adenoma & he had a lap-assisted right hemicolectomy 7/11 by DrWilson.... surg path showed high grade dysplasia, no cancer, neg nodes... DrPatterson plans f/u colon is 34yrs.  Hx of URINARY TRACT INFECTION (ICD-599.0) - he has been evaluated by DrWrenn in 2001 w/ prostatitis... he has some nocturia & we discussed strategies to decr this...  Hx of ERECTILE DYSFUNCTION, MILD (ICD-302.72) - on VIAGRA 100mg Prn... ~  Labs 1/12 showed PSA= 1.96 ~  3/13: Robert [name] wonders about Low-T; Testos level = 537;  PSA remains normal= 1.40  Hx of MYALGIA (ICD-729.1) - s/p eval from DrZieminski in 2005 & his CPK elevations were believed secondary to prev Statin Rx...  ANXIETY (ICD-300.00) - uses ALPRAZOLAM 0.5mg - 1/2 to 1 tab Tid Prn...   Past Surgical History  Procedure Date  . Appendectomy   . Vasectomy   . Colon surgery  07/2009    Dr. Neita Carp tubulovillous adenoma    Outpatient Encounter Prescriptions as of 12/18/2011  Medication Sig Dispense Refill  . ALPRAZolam (XANAX) 0.5 MG tablet TAKE 1/2 - 1 TABLET BY MOUTH THREE TIMES DAILY AS NEEDED FOR NERVES  90 tablet  5  . amLODipine (NORVASC) 10 MG tablet TAKE 1 TABLET BY MOUTH EVERY DAY  30 tablet  0  . aspirin 81 MG tablet Take 81 mg by mouth daily.      Marland Kitchen atenolol (TENORMIN) 100 MG tablet TAKE 1 TABLET BY MOUTH EVERY DAY  30 tablet  0  . gemfibrozil (LOPID) 600 MG tablet TAKE 1 TABLET BY MOUTH  TWICE DAILY  60 tablet  0  . levocetirizine (XYZAL) 5 MG tablet Take 1 tablet (5 mg total) by mouth every evening.  30 tablet  5  . Multiple Vitamins-Minerals (CENTRUM SILVER ULTRA MENS) TABS TAKE 1 TABLET BY MOUTH EVERY DAY  200 tablet  5  . VIAGRA 100 MG tablet TAKE AS DIRECTED  10 tablet  0    Allergies  Allergen Reactions  . Atorvastatin     REACTION: INTOL to Stains w/ myalgias    Current Medications, Allergies, Past Medical History, Past Surgical History, Family History, and Social History were reviewed in [employer] record.   Review of Systems    The patient denies fever, chills, sweats, anorexia, fatigue, weakness, malaise, weight loss, sleep disorder, blurring, diplopia, eye irritation, eye discharge, vision loss, eye pain, photophobia, earache, ear discharge, tinnitus, decreased hearing, nasal congestion, nosebleeds, sore throat, hoarseness, chest pain, palpitations, syncope, dyspnea on exertion, orthopnea, PND, peripheral edema, cough, dyspnea at rest, excessive sputum, hemoptysis, wheezing, pleurisy, nausea, vomiting, diarrhea, constipation, change in bowel habits, abdominal pain, melena, hematochezia, jaundice, gas/bloating, indigestion/heartburn, dysphagia, odynophagia, dysuria, hematuria, urinary frequency, urinary hesitancy, nocturia, incontinence, back pain, joint pain, joint swelling, muscle cramps, muscle weakness,  stiffness, arthritis, sciatica, restless legs, leg pain at night, leg pain with exertion, rash, itching, dryness, suspicious lesions, paralysis, paresthesias, seizures, tremors, vertigo, transient blindness, frequent falls, frequent headaches, difficulty walking, depression, anxiety, memory loss, confusion, cold intolerance, heat intolerance, polydipsia, polyphagia, polyuria, unusual weight change, abnormal bruising, bleeding, enlarged lymph nodes, urticaria, allergic rash, hay fever, and recurrent infections.     Objective:   Physical Exam    WD, WN, 61 y/o BM in NAD... body habitus is mesomorphic... GENERAL:  Alert & oriented; pleasant & cooperative... HEENT:  [place]/AT, EOM-wnl, PERRLA, EACs-clear, TMs-wnl, NOSE-clear, THROAT-clear & wnl. NECK:  Supple w/ full ROM; no JVD; normal carotid impulses w/o bruits; no thyromegaly or nodules palpated; no lymphadenopathy. CHEST:  Clear to P & A; without wheezes/ rales/ or rhonchi. HEART:  Regular Rhythm; without murmurs/ rubs/ or gallops. ABDOMEN:  Soft & nontender; normal bowel sounds; no organomegaly or masses detected. RECTAL:  Neg - prostate 2+ & nontender w/o nodules; stool hematest neg. EXT:  without deformities, mild arthritic changes; no varicose veins/ +venous insuffic/ no edema. NEURO:  CN's intact; motor testing normal; sensory testing normal; gait normal & balance OK. DERM:  No lesions noted; no rash etc...  RADIOLOGY DATA:  Reviewed in the EPIC EMR & discussed w/ the patient...  LABORATORY DATA:  Reviewed in the EPIC EMR & discussed w/ the patient...   Assessment:     HBP>  BP is running higher- he knows he needs to lose wt, elim sodium, etc; we decided to add LOSARTAN 100mg /d...  HYPERLIPID>  On Gemfib600Bid and FLP looks ok; needs diet efforts as noted...  Hx Gastritis>  On Prilosec OTC as needed...  IBS/ Colon Polyps>  He had large dysplastic polyp removed & right hemicolectomy 5/11; due for f/u colon 3 yrs= 5/14...  GU> Hx  prostatitis, ED>  We checked Testos level at his request- WNL.Marland KitchenMarland Kitchen  Anxiety>  Stable on prn Alpraz...     Plan:     Patient's Medications  New Prescriptions   LOSARTAN (COZAAR) 100 MG TABLET    Take 1 tablet (100 mg total) by mouth daily.  Previous Medications   ASPIRIN 81 MG TABLET    Take 81 mg by mouth daily.   TADALAFIL (CIALIS) 20 MG TABLET  Take as directed  Modified Medications   Modified Medication Previous Medication   ALPRAZOLAM (XANAX) 0.5 MG TABLET ALPRAZolam (XANAX) 0.5 MG tablet      Take 1/2 -1 tablet by mouth three times daily as needed for nerves    TAKE 1/2 - 1 TABLET BY MOUTH THREE TIMES DAILY AS NEEDED FOR NERVES   AMLODIPINE (NORVASC) 10 MG TABLET amLODipine (NORVASC) 10 MG tablet      Take 1 tablet (10 mg total) by mouth daily.    TAKE 1 TABLET BY MOUTH EVERY DAY   AMLODIPINE (NORVASC) 10 MG TABLET amLODipine (NORVASC) 10 MG tablet      TAKE 1 TABLET BY MOUTH EVERY DAY    TAKE 1 TABLET BY MOUTH EVERY DAY   ATENOLOL (TENORMIN) 100 MG TABLET atenolol (TENORMIN) 100 MG tablet      Take 1 tablet (100 mg total) by mouth daily.    TAKE 1 TABLET BY MOUTH EVERY DAY   ATENOLOL (TENORMIN) 100 MG TABLET atenolol (TENORMIN) 100 MG tablet      TAKE 1 TABLET BY MOUTH EVERY DAY    TAKE 1 TABLET BY MOUTH EVERY DAY   GEMFIBROZIL (LOPID) 600 MG TABLET gemfibrozil (LOPID) 600 MG tablet      Take 1 tablet (600 mg total) by mouth 2 (two) times daily.    TAKE 1 TABLET BY MOUTH TWICE DAILY   GEMFIBROZIL (LOPID) 600 MG TABLET gemfibrozil (LOPID) 600 MG tablet      TAKE 1 TABLET BY MOUTH TWICE DAILY    TAKE 1 TABLET BY MOUTH TWICE DAILY   LEVOCETIRIZINE (XYZAL) 5 MG TABLET levocetirizine (XYZAL) 5 MG tablet      Take 1 tablet (5 mg total) by mouth every evening.    Take 1 tablet (5 mg total) by mouth every evening.   SILDENAFIL (VIAGRA) 100 MG TABLET VIAGRA 100 MG tablet      Take as directed    TAKE AS DIRECTED  Discontinued Medications   No medications on file

## 2011-12-19 NOTE — Telephone Encounter (Signed)
Refills have been sent to the pharmacy for the pt for these meds on 12-17.

## 2011-12-25 ENCOUNTER — Other Ambulatory Visit: Payer: Self-pay | Admitting: Pulmonary Disease

## 2012-03-17 IMAGING — CR DG CHEST 2V
2 series · 2 of 2 positions shown · non-contrast
Comparison: 10/04/2008

CLINICAL DATA: Physical

CHEST - 2 VIEW

[view not recorded (1 of 2)]
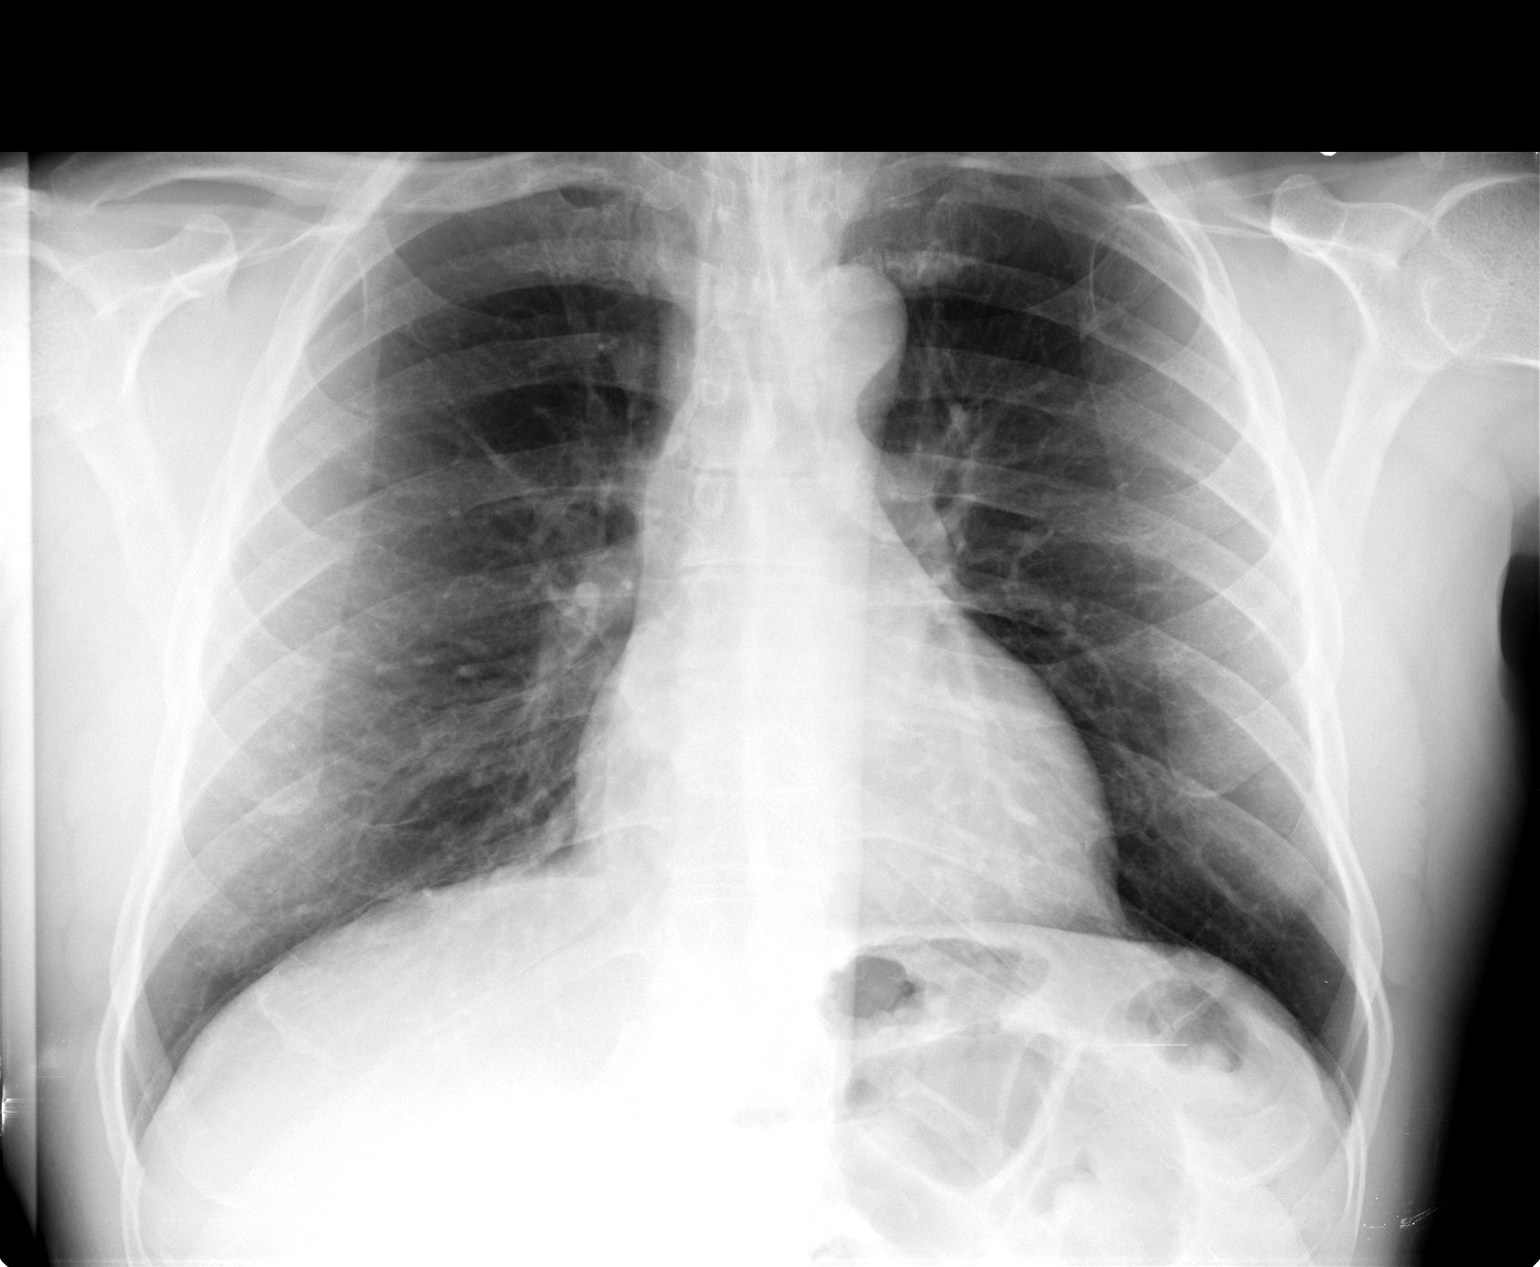

[view not recorded (2 of 2)]
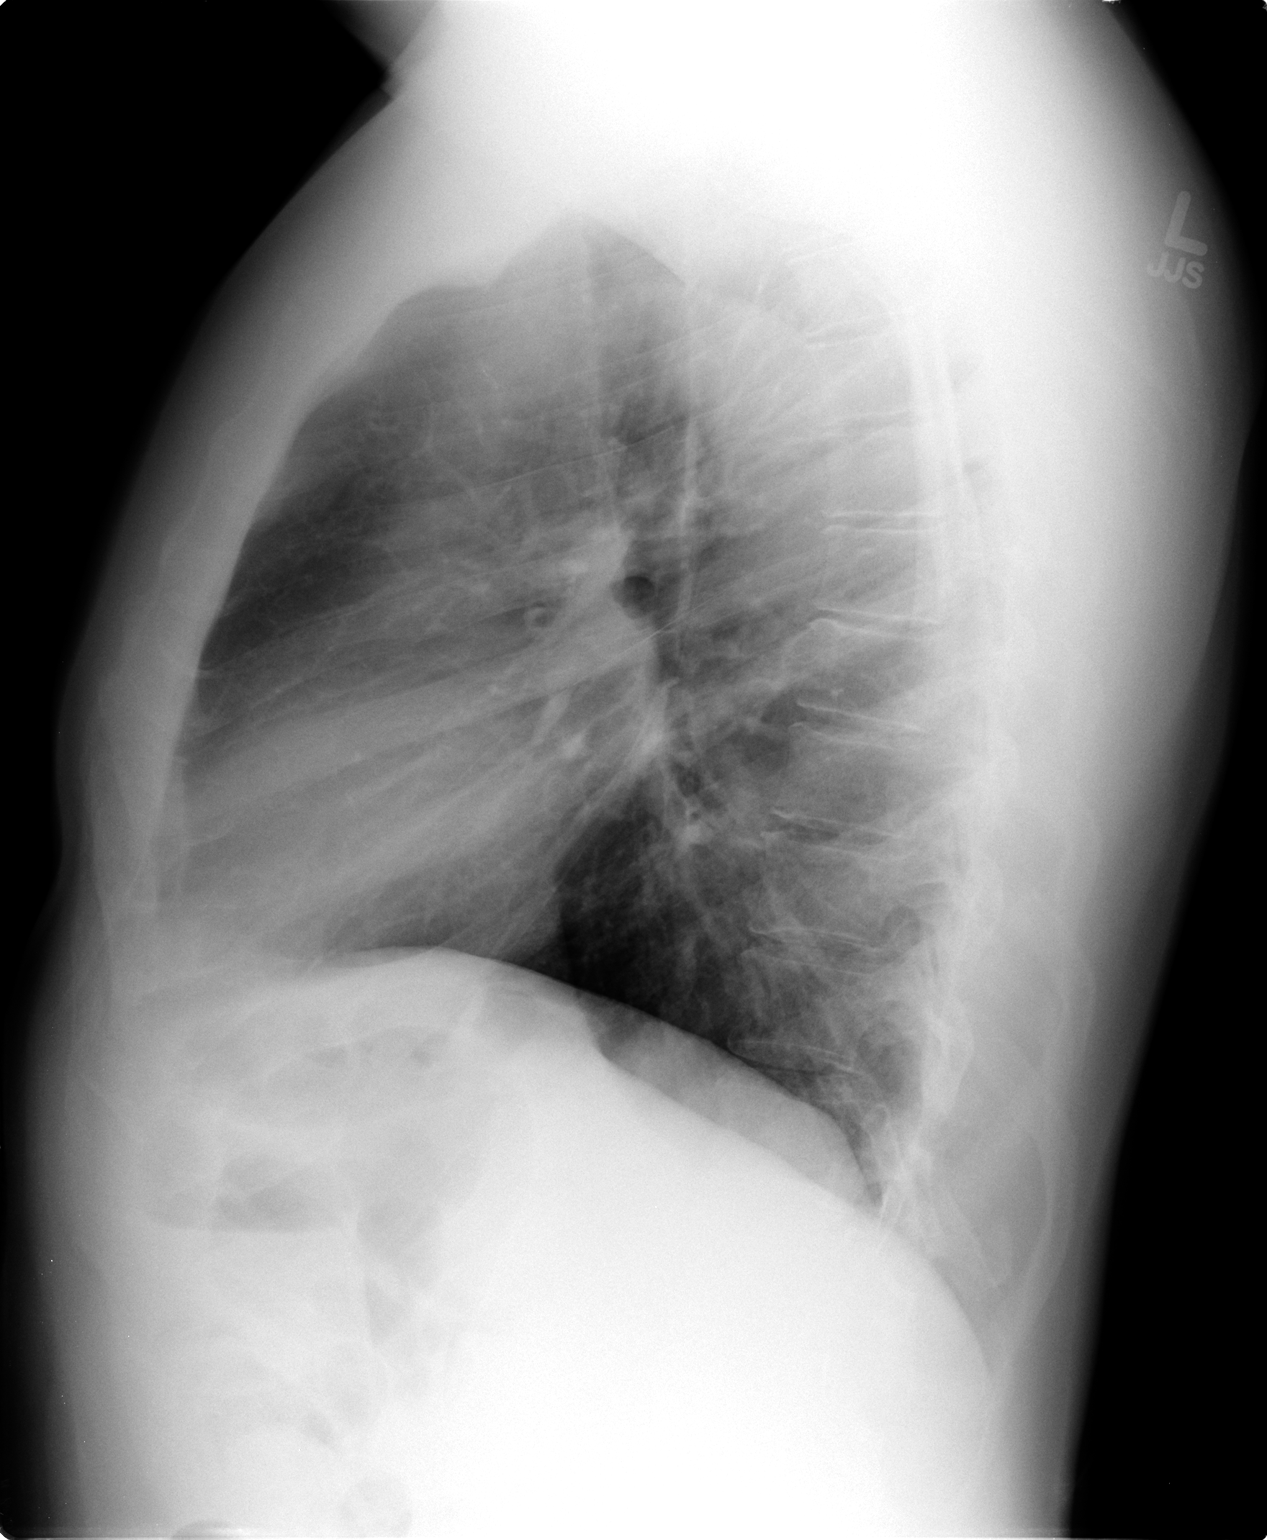

[2 of 2 positions shown; findings below may reference images not displayed]

FINDINGS: Heart size within normal limits.  Pulmonary vascularity
is normal.  Lungs are clear without infiltrate effusion or mass
lesion.  No change from prior study.
IMPRESSION: No active cardiopulmonary disease.

## 2012-03-18 ENCOUNTER — Other Ambulatory Visit (INDEPENDENT_AMBULATORY_CARE_PROVIDER_SITE_OTHER): Payer: BC Managed Care – PPO

## 2012-03-18 ENCOUNTER — Ambulatory Visit (INDEPENDENT_AMBULATORY_CARE_PROVIDER_SITE_OTHER): Payer: BC Managed Care – PPO | Admitting: Pulmonary Disease

## 2012-03-18 ENCOUNTER — Encounter: Payer: Self-pay | Admitting: Pulmonary Disease

## 2012-03-18 VITALS — BP 142/80 | HR 60 | Temp 97.9°F | Ht 71.0 in | Wt 229.8 lb

## 2012-03-18 DIAGNOSIS — I10 Essential (primary) hypertension: Secondary | ICD-10-CM

## 2012-03-18 DIAGNOSIS — K589 Irritable bowel syndrome without diarrhea: Secondary | ICD-10-CM

## 2012-03-18 DIAGNOSIS — K2990 Gastroduodenitis, unspecified, without bleeding: Secondary | ICD-10-CM

## 2012-03-18 DIAGNOSIS — F411 Generalized anxiety disorder: Secondary | ICD-10-CM

## 2012-03-18 DIAGNOSIS — K2970 Gastritis, unspecified, without bleeding: Secondary | ICD-10-CM

## 2012-03-18 DIAGNOSIS — I872 Venous insufficiency (chronic) (peripheral): Secondary | ICD-10-CM

## 2012-03-18 DIAGNOSIS — Z0000 Encounter for general adult medical examination without abnormal findings: Secondary | ICD-10-CM

## 2012-03-18 DIAGNOSIS — E785 Hyperlipidemia, unspecified: Secondary | ICD-10-CM

## 2012-03-18 DIAGNOSIS — D126 Benign neoplasm of colon, unspecified: Secondary | ICD-10-CM

## 2012-03-18 LAB — BASIC METABOLIC PANEL
CO2: 26 mEq/L (ref 19–32)
Calcium: 9.7 mg/dL (ref 8.4–10.5)
GFR: 80.39 mL/min (ref >60.00)
Glucose, Bld: 102 mg/dL — ABNORMAL HIGH (ref 70–99)
Potassium: 4.3 mEq/L (ref 3.5–5.1)
Sodium: 137 mEq/L (ref 135–145)

## 2012-03-18 LAB — HEPATIC FUNCTION PANEL
AST: 49 U/L — ABNORMAL HIGH (ref 0–37)
Albumin: 4.3 g/dL (ref 3.5–5.2)
Alkaline Phosphatase: 67 U/L (ref 39–117)
Bilirubin, Direct: 0.1 mg/dL (ref 0.0–0.3)

## 2012-03-18 LAB — PSA: PSA: 0.55 ng/mL (ref 0.10–4.00)

## 2012-03-18 LAB — CBC WITH DIFFERENTIAL/PLATELET
Basophils Absolute: 0 10*3/uL (ref 0.0–0.1)
Eosinophils Absolute: 0.3 10*3/uL (ref 0.0–0.7)
HCT: 39.3 % (ref 39.0–52.0)
Hemoglobin: 13.4 g/dL (ref 13.0–17.0)
Lymphs Abs: 1.4 10*3/uL (ref 0.7–4.0)
MCHC: 34.2 g/dL (ref 30.0–36.0)
Monocytes Absolute: 0.4 10*3/uL (ref 0.1–1.0)
Neutro Abs: 1.5 10*3/uL (ref 1.4–7.7)
Platelets: 260 10*3/uL (ref 150.0–400.0)
RDW: 14.2 % (ref 11.5–14.6)

## 2012-03-18 LAB — LIPID PANEL
HDL: 45.6 mg/dL (ref >39.00)
Total CHOL/HDL Ratio: 3
VLDL: 23.8 mg/dL (ref 0.0–40.0)

## 2012-03-18 LAB — TSH: TSH: 1.42 u[IU]/mL (ref 0.35–5.50)

## 2012-03-18 NOTE — Patient Instructions (Addendum)
Today we updated your med list in our EPIC system...    Continue your current medications the same...  Today we did your follow up FASTING blood work...    We will contact you w/ the results when available...   Keep up the good work w/ DIET & exercise...  Call for any questions...  Let's plan a brief follow up visit recheck in 6 months.Marland KitchenMarland Kitchen

## 2012-03-18 NOTE — Progress Notes (Signed)
Subjective:     Patient ID: Robert [name], male   DOB: 27-Apr-1950, 62 y.o.   MRN: [id number]  HPI 62 y/o BM here for a follow up visit and CPX... he has multiple medical problems as noted below...    ~  January 10, 2010:  Robert [name] saw some rectal blood 5/11 & saw DrPatterson for eval- subseq colonoscopy revealed a large multilobulated sessile polyp at hepatic flexure- bx= tubulovillous adenoma & he had a lap-assisted right hemicolectomy 7/11 by DrWilson... uneventful post-op recovery & back to normal now... BP controlled on Aten/ Amlod;  Chol has been good on diet+ Gemfib;  he uses Omep40 Prn for acid symptoms;  uses Alpraz 1/2 Prn as well...  ~  March 07, 2011:  Yearly ROV & CPX>  He has had a good year overall, no new complaints or concerns except he would like to get his Testosterone level checked with all he's heard on TV & such (it was normal)... I note that he has gained 18# to 225# today and BP is sl elev at 160/92, he has a home BP cuff but not checking; he exercises for 10-22min the the AM almost everyday but hasn't really been on a specific diet; we reviewed low sodium, low carb, weight reducing diet to help his BP w/o having to incr meds yet...    HBP> on Amlod10 & Aten100; BP= 160/92 & he denies CP, palpit, dizzy, SOB, edema, etc; he will monitor BP at home & work on wt reduction; ROV recheck 39mo...    Hyperlipid> on Gemfib600Bid; FLP showed TChol 151, TG 101, HDL 52, LDL 79...    GI> Hx Gastritis, IBS, Polyps> uses Prilosec20 prn; had large adenoma removed 5/11 w/ dysplasia & subseq resection (see below); he denies any GI symptoms & f/u colon due 5/14 per DrPatterson...    Anxiety> he uses Alprazolam 0.5mg - 1/2 tab 2-3 times per week for stress or to help him rest... CXR 3/13 showed normal heart size, clear lungs, NAD.Marland KitchenMarland Kitchen EKG 3/13 showed SBrady, rate50, WNL/ NAD... Labs 3/13:  FLP- at goals on Simva40;  Chems- essen WNL;  CBC- wnl;  TSH=2.58;  PSA=1.40;  Testos=537  ~  June 13, 2011:  39mo  ROV & Robert [name]'s BP at home is usually in the 130-140/70-80 range he says; here it reads 150/90 but he's had a stressful day; unfortunately he didn't bring his BP log or his machine to compare; he has lost 4# & says he is avoiding sodium etc;  I asked him again to check BP at diff times & keep a log of readings for [place] to review- he will call if readings are >150/90... We reviewed prob list, meds, xrays and labs> see below>>  ~  December 18, 2011:  70mo ROV & Robert [name] is c/o incr BP readings at home- running 130/80 - 150s/100; he denies HA, visual symptoms, CP, palit, SOB, edema; BP= 144/86 today & we decided to add LOSARTAN 100mg /d & continue to monitor...  Stable on Lopid & otherw w/o complaints or concerns...    We reviewed prob list, meds, xrays and labs> see below for updates >>   ~  March 18, 2012:  39mo ROV & Robert [name] has been doing well- here for his annual CPX-  We reviewed the following medical problems during today's office visit >>     AR> on Xyzal as needed...    HBP> on Aten100, Amlod10, Losar100; BP= 142/80 & he denies CP, palpit, dizzy, SOB, edema, etc; he will monitor  BP at home & work on wt reduction...    Hyperlipid> on Gemfib600Bid; FLP showed TChol 132, TG 119, HDL 46, LDL 63...    GI> Hx Gastritis, IBS, Polyps> uses Prilosec20 OTC prn; had large adenoma removed 5/11 w/ dysplasia & subseq right hemicolectomy; he denies any GI symptoms & f/u colon due 5/14 per DrPatterson...    Anxiety> he uses Alprazolam 0.5mg - 1/2 tab 2-3 times per week for stress or to help him rest... We reviewed prob list, meds, xrays and labs> see below for updates >> he had the seasonal flu vaccine 10/13 LABS 3/14:  FLP- at goals on Lopid;  Chems- ok x SGOT=49;  CBC- wnl;  TSH=1.42;  PSA=0.55...          PROBLEM LIST:    PHYSICAL EXAMINATION (ICD-V70.0) - on ASA 81mg /d... exercises regularly w/ elliptical machine, weights, etc... he had PNEUMOVAX in 2005 (age 2)... he gets the yearly Flu vaccine each  autumn.  HYPERTENSION (ICD-401.9) - on ATENOLOL 100mg /d, NORVASC 10mg /d, & LOSARTAN 100mg /d (added 12/13)...  ~  1/12: BP= 128/82> taking meds regulaly & tolerating well; denies HA, fatigue, visual changes, CP, palipit, dizziness, syncope, dyspnea, edema, etc... ~  3/13: BP= 160/92 & he has gained 18# to 227#... We discussed options for adding meds now vs short term lifestyle changes- no salt, wt reducing diet + exercise (he would like to try the latter & will monitor BP at home & f/u here... ~  3/13: CXR 3/13 showed normal heart size, clear lungs, NAD.Marland KitchenMarland Kitchen EKG 3/13 showed SBrady, rate50, WNL/ NAD... ~  6/13:  BP=150/90 after a stressful day etc; he takes his Aten/ Amlod regularly & tol well; denies CP, palpit, SOB, edema... ~  12/13: BP= 144/86 but has run up to 150/100 at home; we decided to add LOSARTAN 100mg /d... ~  4/14: on Aten100, Amlod10, Losar100; BP= 142/80 & he denies CP, palpit, dizzy, SOB, edema, etc; he will monitor BP at home & work on wt reduction.  VENOUS INSUFFICIENCY (ICD-459.81) - he knows to elim sodium, elevate legs, wear support hose...   HYPERLIPIDEMIA, BORDERLINE (ICD-272.4) - INTOL to Statin meds... on GEMFIBRIZOL 600mg Bid w/ good control- his Chol has been over 300 in the past & intol to statins w/ incr musc enz. ~  FLP 7/08 (wt=218#) showed TChol 154, TG 98, HDL 44, LDL 90... ~  FLP 10/09 (wt=222#) showed TChol 183, TG 86, HDL 48, LDL 118... rec> same med + better diet. ~  FLP 10/10 (wt=216#) showed TChol 173, TG 99, HDL 53, LDL 101 ~  FLP 1/12 (wt=209#) on Gemfib600Bid showed TChol 154, TG 53, HDL 57, LDL 87 ~  FLP 3/13 (wt=227#) on Gemfib600Bid showed TChol 151, TG 101, HDL 52, LDL 79 ~  FLP 4/14 (wt=230#) on Lipid600Bid showed TChol 132, TG 119, HDL 46, LDL 63  Hx of GASTRITIS (ICD-535.50) - on OMEPRAZOLE 40mg  Prn... denies nausea, vomiting, heartburn, diarrhea, constipation, blood in stool, abdominal pain, swelling, gas...  ~  EGD 7/07 by DrPatterson showed  gastritis & duodenitis- HPylori neg...  IRRITABLE BOWEL SYNDROME (ICD-564.1) - Hx IBS w/ constipation...  COLONIC POLYPS (ICD-211.3) - s/p resection villous adenoma 7/11... ~  colonoscopy 7/07 by DrPatterson was WNL. ~  pt saw some rectal blood 5/11> eval DrPatterson w/ colonoscopy revealing a large multilobulated sessile polyp at hepatic flexure- bx= tubulovillous adenoma & he had a lap-assisted right hemicolectomy 7/11 by DrWilson.... surg path showed high grade dysplasia, no cancer, neg nodes... DrPatterson plans f/u colon is 7yrs.  Hx of URINARY TRACT INFECTION (ICD-599.0) - he has been evaluated by DrWrenn in 2001 w/ prostatitis... he has some nocturia & we discussed strategies to decr this...  Hx of ERECTILE DYSFUNCTION, MILD (ICD-302.72) - on VIAGRA 100mg Prn... ~  Labs 1/12 showed PSA= 1.96 ~  3/13: Jayvien wonders about Low-T; Testos level = 537;  PSA remains normal= 1.40 ~  Labs 4/14 showed PSA = 0.55  Hx of MYALGIA (ICD-729.1) - s/p eval from DrZieminski in 2005 & his CPK elevations were believed secondary to prev Statin Rx...  ANXIETY (ICD-300.00) - uses ALPRAZOLAM 0.5mg - 1/2 to 1 tab Tid Prn...   Past Surgical History  Procedure Laterality Date  . Appendectomy    . Vasectomy    . Colon surgery  07/2009    Dr. Neita Carp tubulovillous adenoma    Outpatient Encounter Prescriptions as of 03/18/2012  Medication Sig Dispense Refill  . ALPRAZolam (XANAX) 0.5 MG tablet Take 1/2 -1 tablet by mouth three times daily as needed for nerves  90 tablet  5  . amLODipine (NORVASC) 10 MG tablet Take 1 tablet (10 mg total) by mouth daily.  30 tablet  11  . aspirin 81 MG tablet Take 81 mg by mouth daily.      Marland Kitchen atenolol (TENORMIN) 100 MG tablet Take 1 tablet (100 mg total) by mouth daily.  30 tablet  11  . gemfibrozil (LOPID) 600 MG tablet Take 1 tablet (600 mg total) by mouth 2 (two) times daily.  60 tablet  11  . levocetirizine (XYZAL) 5 MG tablet Take 1 tablet (5 mg total) by mouth every  evening.  30 tablet  5  . losartan (COZAAR) 100 MG tablet Take 1 tablet (100 mg total) by mouth daily.  30 tablet  11  . sildenafil (VIAGRA) 100 MG tablet Take as directed  10 tablet  5  . tadalafil (CIALIS) 20 MG tablet Take as directed      . [DISCONTINUED] amLODipine (NORVASC) 10 MG tablet TAKE 1 TABLET BY MOUTH EVERY DAY  30 tablet  3  . [DISCONTINUED] atenolol (TENORMIN) 100 MG tablet TAKE 1 TABLET BY MOUTH EVERY DAY  30 tablet  3  . [DISCONTINUED] gemfibrozil (LOPID) 600 MG tablet TAKE 1 TABLET BY MOUTH TWICE DAILY  60 tablet  3   No facility-administered encounter medications on file as of 03/18/2012.    Allergies  Allergen Reactions  . Atorvastatin     REACTION: INTOL to Stains w/ myalgias    Current Medications, Allergies, Past Medical History, Past Surgical History, Family History, and Social History were reviewed in [employer] record.   Review of Systems    The patient denies fever, chills, sweats, anorexia, fatigue, weakness, malaise, weight loss, sleep disorder, blurring, diplopia, eye irritation, eye discharge, vision loss, eye pain, photophobia, earache, ear discharge, tinnitus, decreased hearing, nasal congestion, nosebleeds, sore throat, hoarseness, chest pain, palpitations, syncope, dyspnea on exertion, orthopnea, PND, peripheral edema, cough, dyspnea at rest, excessive sputum, hemoptysis, wheezing, pleurisy, nausea, vomiting, diarrhea, constipation, change in bowel habits, abdominal pain, melena, hematochezia, jaundice, gas/bloating, indigestion/heartburn, dysphagia, odynophagia, dysuria, hematuria, urinary frequency, urinary hesitancy, nocturia, incontinence, back pain, joint pain, joint swelling, muscle cramps, muscle weakness, stiffness, arthritis, sciatica, restless legs, leg pain at night, leg pain with exertion, rash, itching, dryness, suspicious lesions, paralysis, paresthesias, seizures, tremors, vertigo, transient blindness, frequent falls,  frequent headaches, difficulty walking, depression, anxiety, memory loss, confusion, cold intolerance, heat intolerance, polydipsia, polyphagia, polyuria, unusual weight change, abnormal bruising, bleeding, enlarged lymph  nodes, urticaria, allergic rash, hay fever, and recurrent infections.     Objective:   Physical Exam    WD, WN, 61 y/o BM in NAD... body habitus is mesomorphic... GENERAL:  Alert & oriented; pleasant & cooperative... HEENT:  [place]/AT, EOM-wnl, PERRLA, EACs-clear, TMs-wnl, NOSE-clear, THROAT-clear & wnl. NECK:  Supple w/ full ROM; no JVD; normal carotid impulses w/o bruits; no thyromegaly or nodules palpated; no lymphadenopathy. CHEST:  Clear to P & A; without wheezes/ rales/ or rhonchi. HEART:  Regular Rhythm; without murmurs/ rubs/ or gallops. ABDOMEN:  Soft & nontender; normal bowel sounds; no organomegaly or masses detected. RECTAL:  Neg - prostate 2+ & nontender w/o nodules; stool hematest neg. EXT:  without deformities, mild arthritic changes; no varicose veins/ +venous insuffic/ no edema. NEURO:  CN's intact; motor testing normal; sensory testing normal; gait normal & balance OK. DERM:  No lesions noted; no rash etc...  RADIOLOGY DATA:  Reviewed in the EPIC EMR & discussed w/ the patient...  LABORATORY DATA:  Reviewed in the EPIC EMR & discussed w/ the patient...   Assessment:     HBP>  BP is now better controlled on 3 meds listed....  HYPERLIPID>  On Gemfib600Bid and FLP looks ok; needs diet efforts as noted...  Hx Gastritis>  On Prilosec OTC as needed...  IBS/ Colon Polyps>  He had large dysplastic polyp removed & right hemicolectomy 5/11; due for f/u colon 3 yrs= 5/14...  GU> Hx prostatitis, ED>  We checked Testos level at his request- WNL.Marland KitchenMarland Kitchen  Anxiety>  Stable on prn Alpraz...     Plan:     Patient's Medications  New Prescriptions   No medications on file  Previous Medications   AMLODIPINE (NORVASC) 10 MG TABLET    Take 1 tablet (10 mg total) by  mouth daily.   ASPIRIN 81 MG TABLET    Take 81 mg by mouth daily.   ATENOLOL (TENORMIN) 100 MG TABLET    Take 1 tablet (100 mg total) by mouth daily.   GEMFIBROZIL (LOPID) 600 MG TABLET    Take 1 tablet (600 mg total) by mouth 2 (two) times daily.   LEVOCETIRIZINE (XYZAL) 5 MG TABLET    Take 1 tablet (5 mg total) by mouth every evening.   LOSARTAN (COZAAR) 100 MG TABLET    Take 1 tablet (100 mg total) by mouth daily.   SILDENAFIL (VIAGRA) 100 MG TABLET    Take as directed   TADALAFIL (CIALIS) 20 MG TABLET    Take as directed  Modified Medications   Modified Medication Previous Medication   ALPRAZOLAM (XANAX) 0.5 MG TABLET ALPRAZolam (XANAX) 0.5 MG tablet      Take 1/2 -1 tablet by mouth three times daily as needed for nerves    Take 1/2 -1 tablet by mouth three times daily as needed for nerves   SILDENAFIL (REVATIO) 20 MG TABLET sildenafil (REVATIO) 20 MG tablet      Take as directed    Take 1 tablet (20 mg total) by mouth 3 (three) times daily.  Discontinued Medications   AMLODIPINE (NORVASC) 10 MG TABLET    TAKE 1 TABLET BY MOUTH EVERY DAY   ATENOLOL (TENORMIN) 100 MG TABLET    TAKE 1 TABLET BY MOUTH EVERY DAY   GEMFIBROZIL (LOPID) 600 MG TABLET    TAKE 1 TABLET BY MOUTH TWICE DAILY

## 2012-03-19 ENCOUNTER — Other Ambulatory Visit: Payer: Self-pay | Admitting: Pulmonary Disease

## 2012-03-19 ENCOUNTER — Encounter: Payer: Self-pay | Admitting: Pulmonary Disease

## 2012-03-19 ENCOUNTER — Telehealth: Payer: Self-pay | Admitting: Pulmonary Disease

## 2012-03-19 MED ORDER — SILDENAFIL CITRATE 20 MG PO TABS: ORAL_TABLET | ORAL | Status: DC

## 2012-03-19 MED ORDER — SILDENAFIL CITRATE 20 MG PO TABS: 20.0000 mg | ORAL_TABLET | Freq: Three times a day (TID) | ORAL | Status: DC

## 2012-03-19 NOTE — Telephone Encounter (Signed)
SN spoke with the pharmacists regarding RX. Per SN need to change directions in pt chart to take as directed and not take TID. This has been fixed. Nothing further was needed

## 2012-03-25 ENCOUNTER — Other Ambulatory Visit: Payer: Self-pay | Admitting: Pulmonary Disease

## 2012-03-25 ENCOUNTER — Encounter: Payer: Self-pay | Admitting: Pulmonary Disease

## 2012-03-25 MED ORDER — ALPRAZOLAM 0.5 MG PO TABS: ORAL_TABLET | ORAL | Status: DC

## 2012-04-30 ENCOUNTER — Encounter: Payer: Self-pay | Admitting: Gastroenterology

## 2012-05-05 ENCOUNTER — Encounter: Payer: Self-pay | Admitting: Gastroenterology

## 2012-05-27 ENCOUNTER — Ambulatory Visit (AMBULATORY_SURGERY_CENTER): Payer: BC Managed Care – PPO

## 2012-05-27 VITALS — Ht 71.0 in | Wt 228.4 lb

## 2012-05-27 DIAGNOSIS — Z800 Family history of malignant neoplasm of digestive organs: Secondary | ICD-10-CM

## 2012-05-27 DIAGNOSIS — Z86010 Personal history of colonic polyps: Secondary | ICD-10-CM

## 2012-05-27 DIAGNOSIS — C189 Malignant neoplasm of colon, unspecified: Secondary | ICD-10-CM

## 2012-05-27 DIAGNOSIS — Z1211 Encounter for screening for malignant neoplasm of colon: Secondary | ICD-10-CM

## 2012-05-27 MED ORDER — MOVIPREP 100 G PO SOLR: ORAL | Status: DC

## 2012-05-28 ENCOUNTER — Encounter: Payer: Self-pay | Admitting: Gastroenterology

## 2012-06-09 ENCOUNTER — Other Ambulatory Visit: Payer: Self-pay | Admitting: *Deleted

## 2012-06-09 ENCOUNTER — Encounter: Payer: Self-pay | Admitting: Pulmonary Disease

## 2012-06-09 MED ORDER — SILDENAFIL CITRATE 100 MG PO TABS: ORAL_TABLET | ORAL | Status: DC

## 2012-06-11 ENCOUNTER — Ambulatory Visit (AMBULATORY_SURGERY_CENTER): Payer: BC Managed Care – PPO | Admitting: Gastroenterology

## 2012-06-11 ENCOUNTER — Encounter: Payer: Self-pay | Admitting: Gastroenterology

## 2012-06-11 VITALS — BP 121/68 | HR 48 | Temp 97.5°F | Resp 19 | Ht 71.0 in | Wt 228.0 lb

## 2012-06-11 DIAGNOSIS — Z86010 Personal history of colonic polyps: Secondary | ICD-10-CM

## 2012-06-11 DIAGNOSIS — K644 Residual hemorrhoidal skin tags: Secondary | ICD-10-CM

## 2012-06-11 DIAGNOSIS — K625 Hemorrhage of anus and rectum: Secondary | ICD-10-CM

## 2012-06-11 DIAGNOSIS — D126 Benign neoplasm of colon, unspecified: Secondary | ICD-10-CM

## 2012-06-11 DIAGNOSIS — Z1211 Encounter for screening for malignant neoplasm of colon: Secondary | ICD-10-CM

## 2012-06-11 DIAGNOSIS — C189 Malignant neoplasm of colon, unspecified: Secondary | ICD-10-CM

## 2012-06-11 MED ORDER — SODIUM CHLORIDE 0.9 % IV SOLN: 500.0000 mL | INTRAVENOUS | Status: DC

## 2012-06-11 MED ORDER — PRAMOXINE-HC 1-1 % EX CREA: TOPICAL_CREAM | Freq: Every evening | CUTANEOUS | Status: DC | PRN

## 2012-06-11 NOTE — Patient Instructions (Addendum)
Discharge instructions given with verbal understanding. Handouts on hemorrhoids and a high fiber diet given. Resume previous medications. YOU HAD AN ENDOSCOPIC PROCEDURE TODAY AT THE [facility]: Refer to the procedure report that was given to you for any specific questions about what was found during the examination.  If the procedure report does not answer your questions, please call your gastroenterologist to clarify.  If you requested that your care partner not be given the details of your procedure findings, then the procedure report has been included in a sealed envelope for you to review at your convenience later.  YOU SHOULD EXPECT: Some feelings of bloating in the abdomen. Passage of more gas than usual.  Walking can help get rid of the air that was put into your GI tract during the procedure and reduce the bloating. If you had a lower endoscopy (such as a colonoscopy or flexible sigmoidoscopy) you may notice spotting of blood in your stool or on the toilet paper. If you underwent a bowel prep for your procedure, then you may not have a normal bowel movement for a few days.  DIET: Your first meal following the procedure should be a light meal and then it is ok to progress to your normal diet.  A half-sandwich or bowl of soup is an example of a good first meal.  Heavy or fried foods are harder to digest and may make you feel nauseous or bloated.  Likewise meals heavy in dairy and vegetables can cause extra gas to form and this can also increase the bloating.  Drink plenty of fluids but you should avoid alcoholic beverages for 24 hours.  ACTIVITY: Your care partner should take you home directly after the procedure.  You should plan to take it easy, moving slowly for the rest of the day.  You can resume normal activity the day after the procedure however you should NOT DRIVE or use heavy machinery for 24 hours (because of the sedation medicines used during the test).    SYMPTOMS TO  REPORT IMMEDIATELY: A gastroenterologist can be reached at any hour.  During normal business hours, 8:30 AM to 5:00 PM Monday through Friday, call [phone].  After hours and on weekends, please call the GI answering service at [phone] who will take a message and have the physician on call contact you.   Following lower endoscopy (colonoscopy or flexible sigmoidoscopy):  Excessive amounts of blood in the stool  Significant tenderness or worsening of abdominal pains  Swelling of the abdomen that is new, acute  Fever of 100F or higher FOLLOW UP: If any biopsies were taken you will be contacted by phone or by letter within the next 1-3 weeks.  Call your gastroenterologist if you have not heard about the biopsies in 3 weeks.  Our staff will call the home number listed on your records the next business day following your procedure to check on you and address any questions or concerns that you may have at that time regarding the information given to you following your procedure. This is a courtesy call and so if there is no answer at the home number and we have not heard from you through the emergency physician on call, we will assume that you have returned to your regular daily activities without incident.  SIGNATURES/CONFIDENTIALITY: You and/or your care partner have signed paperwork which will be entered into your electronic medical record.  These signatures attest to the fact that that the information above on your   After Visit Summary has been reviewed and is understood.  Full responsibility of the confidentiality of this discharge information lies with you and/or your care-partner.  

## 2012-06-11 NOTE — Progress Notes (Signed)
Lidocaine-40mg IV prior to Propofol InductionPropofol given over incremental dosages 

## 2012-06-11 NOTE — Op Note (Signed)
[facility] [address] [place], [address]   COLONOSCOPY PROCEDURE REPORT  PATIENT: [name], Robert Kimbrell  MR#: [id number] BIRTHDATE: 06/13/1950 , 62  yrs. old GENDER: Male ENDOSCOPIST: Mardella Layman, MD, [facility] REFERRED BY: PROCEDURE DATE:  06/11/2012 PROCEDURE:   Colonoscopy, surveillance ASA CLASS:   Class II INDICATIONS:Patient's personal history of adenomatous colon polyps..prior right hemicolectomy in 2011 because of a large unresectable polyp. MEDICATIONS: propofol (Diprivan) 200mg  IV  DESCRIPTION OF PROCEDURE:   After the risks and benefits and of the procedure were explained, informed consent was obtained.  A digital rectal exam revealed no abnormalities of the rectum.    The [id number]  endoscope was introduced through the anus and advanced to the cecum, which was identified by both the appendix and ileocecal valve .  The quality of the prep was excellent, using MoviPrep .  The instrument was then slowly withdrawn as the colon was fully examined.     COLON FINDINGS: There was evidence of a normal appearing prior surgical anastomosis.the right colon has previously been removed surgically.  Anastomosis appears entirely normal.  Examination the rest colon showed no mucosal polypoid lesions.   Small external hemorrhoids were found.   The colon was otherwise normal.  There was no diverticulosis, inflammation, polyps or cancers unless previously stated.     Retroflexed views revealed external hemorrhoids.     The scope was then withdrawn from the patient and the procedure completed.  COMPLICATIONS: There were no complications. ENDOSCOPIC IMPRESSION: 1.   There was evidence of prior surgical anastomosis ..right hemicolectomy. 2.   Small external hemorrhoids 3.   The colon was otherwise normal  RECOMMENDATIONS: Repeat Colonoscopy in 5 years.   REPEAT EXAM:  NF:AOZHY Elayne Snare, MD and Gaynelle Adu  MD  _______________________________ eSigned:  Mardella Layman, MD, [facility] 06/11/2012 9:58 AM

## 2012-06-11 NOTE — Progress Notes (Signed)
Patient did not experience any of the following events: a burn prior to discharge; a fall within the facility; wrong site/side/patient/procedure/implant event; or a hospital transfer or hospital admission upon discharge from the facility. (G8907) Patient did not have preoperative order for IV antibiotic SSI prophylaxis. ([id number])  

## 2012-06-12 ENCOUNTER — Telehealth: Payer: Self-pay

## 2012-06-12 NOTE — Telephone Encounter (Signed)
  Follow up Call-  Call back number 06/11/2012  Post procedure Call Back phone  # [phone]  Permission to leave phone message Yes     Patient questions:  Do you have a fever, pain , or abdominal swelling? no Pain Score  0 *  Have you tolerated food without any problems? yes  Have you been able to return to your normal activities? yes  Do you have any questions about your discharge instructions: Diet   no Medications  no Follow up visit  no  Do you have questions or concerns about your Care? no  Actions: * If pain score is 4 or above: No action needed, pain <4.

## 2012-06-23 ENCOUNTER — Other Ambulatory Visit: Payer: Self-pay | Admitting: Pulmonary Disease

## 2012-09-17 ENCOUNTER — Ambulatory Visit: Payer: BC Managed Care – PPO | Admitting: Pulmonary Disease

## 2012-11-06 ENCOUNTER — Other Ambulatory Visit: Payer: Self-pay

## 2012-11-17 ENCOUNTER — Encounter: Payer: Self-pay | Admitting: Pulmonary Disease

## 2012-11-17 ENCOUNTER — Ambulatory Visit (INDEPENDENT_AMBULATORY_CARE_PROVIDER_SITE_OTHER): Payer: BC Managed Care – PPO | Admitting: Pulmonary Disease

## 2012-11-17 VITALS — BP 142/88 | HR 50 | Temp 97.4°F | Ht 71.0 in | Wt 226.2 lb

## 2012-11-17 DIAGNOSIS — K2970 Gastritis, unspecified, without bleeding: Secondary | ICD-10-CM

## 2012-11-17 DIAGNOSIS — K921 Melena: Secondary | ICD-10-CM

## 2012-11-17 DIAGNOSIS — F411 Generalized anxiety disorder: Secondary | ICD-10-CM

## 2012-11-17 DIAGNOSIS — E785 Hyperlipidemia, unspecified: Secondary | ICD-10-CM

## 2012-11-17 DIAGNOSIS — K589 Irritable bowel syndrome without diarrhea: Secondary | ICD-10-CM

## 2012-11-17 DIAGNOSIS — K649 Unspecified hemorrhoids: Secondary | ICD-10-CM

## 2012-11-17 DIAGNOSIS — D126 Benign neoplasm of colon, unspecified: Secondary | ICD-10-CM

## 2012-11-17 DIAGNOSIS — I10 Essential (primary) hypertension: Secondary | ICD-10-CM

## 2012-11-17 DIAGNOSIS — K644 Residual hemorrhoidal skin tags: Secondary | ICD-10-CM

## 2012-11-17 MED ORDER — PRAMOXINE-HC 1-1 % EX CREA: TOPICAL_CREAM | CUTANEOUS | Status: DC

## 2012-11-17 NOTE — Patient Instructions (Signed)
Today we updated your med list in our EPIC system...    Continue your current medications the same...    We refilled the [facility] hemorrhoid cream...  Be sure your stools are soft & easy to pass- try Metamucil, Miralax, Colase or Senakot-S to help if needed...  Call for any questions...  Let's plan a follow up visit in 69mo, sooner if needed for problems.Marland KitchenMarland Kitchen

## 2012-11-17 NOTE — Progress Notes (Signed)
Subjective:     Patient ID: Robert [name], male   DOB: [birth year], 62 y.o.   MRN: [id number]  HPI 62 y/o BM here for a follow up visit and CPX... he has multiple medical problems as noted below...    ~  March 07, 2011:  Yearly ROV & CPX>  He has had a good year overall, no new complaints or concerns except he would like to get his Testosterone level checked with all he's heard on TV & such (it was normal)... I note that he has gained 18# to 225# today and BP is sl elev at 160/92, he has a home BP cuff but not checking; he exercises for 10-52min the the AM almost everyday but hasn't really been on a specific diet; we reviewed low sodium, low carb, weight reducing diet to help his BP w/o having to incr meds yet...    HBP> on Amlod10 & Aten100; BP= 160/92 & he denies CP, palpit, dizzy, SOB, edema, etc; he will monitor BP at home & work on wt reduction; ROV recheck 51mo...    Hyperlipid> on Gemfib600Bid; FLP showed TChol 151, TG 101, HDL 52, LDL 79...    GI> Hx Gastritis, IBS, Polyps> uses Prilosec20 prn; had large adenoma removed 5/11 w/ dysplasia & subseq resection (see below); he denies any GI symptoms & f/u colon due 5/14 per Robert [name]...    Anxiety> he uses Alprazolam 0.5mg - 1/2 tab 2-3 times per week for stress or to help him rest... CXR 3/13 showed normal heart size, clear lungs, NAD.Marland KitchenMarland Kitchen EKG 3/13 showed SBrady, rate50, WNL/ NAD... Labs 3/13:  FLP- at goals on Simva40;  Chems- essen WNL;  CBC- wnl;  TSH=2.58;  PSA=1.40;  Testos=537  ~  June 13, 2011:  51mo ROV & Robert [name]'s BP at home is usually in the 130-140/70-80 range he says; here it reads 150/90 but he's had a stressful day; unfortunately he didn't bring his BP log or his machine to compare; he has lost 4# & says he is avoiding sodium etc;  I asked him again to check BP at diff times & keep a log of readings for [place] to review- he will call if readings are >150/90... We reviewed prob list, meds, xrays and labs> see below>>  ~  December 18, 2011:   75mo ROV & Robert [name] is c/o incr BP readings at home- running 130/80 - 150s/100; he denies HA, visual symptoms, CP, palit, SOB, edema; BP= 144/86 today & we decided to add LOSARTAN 100mg /d & continue to monitor...  Stable on Lopid & otherw w/o complaints or concerns...    We reviewed prob list, meds, xrays and labs> see below for updates >>   ~  March 18, 2012:  51mo ROV & Robert [name] has been doing well- here for his annual CPX-  We reviewed the following medical problems during today's office visit >>     AR> on Xyzal as needed...    HBP> on Aten100, Amlod10, Losar100; BP= 142/80 & he denies CP, palpit, dizzy, SOB, edema, etc; he will monitor BP at home & work on wt reduction...    Hyperlipid> on Gemfib600Bid; FLP showed TChol 132, TG 119, HDL 46, LDL 63...    GI> Hx Gastritis, IBS, Polyps> uses Prilosec20 OTC prn; had large adenoma removed 5/11 w/ dysplasia & subseq right hemicolectomy; he denies any GI symptoms & f/u colon due 5/14 per Robert [name]...    Anxiety> he uses Alprazolam 0.5mg - 1/2 tab 2-3 times per week for stress or to help  him rest... We reviewed prob list, meds, xrays and labs> see below for updates >> he had the seasonal flu vaccine 10/13 LABS 3/14:  FLP- at goals on Lopid;  Chems- ok x SGOT=49;  CBC- wnl;  TSH=1.42;  PSA=0.55...  ~  November 17, 2012:  37mo ROV & Robert [name] has recently noted some BRB in stool- he has hx large polyp 2011 (tubulovillous adenoma w/ dysplasia) & had right hemicolectomy by drWilson, [facility]; f/u colonoscopy by Robert [name] 6/14 was clean- no polyps, anastomosis clear, sm ext hems noted; now w/ sm volume hematochezia- almost surely related to the hems; he has AnalpramHC cream on his med list but on questioning he never filled this- discussed new Rx & how to use it + stool softeners, etc...     Robert [name] has been doing well- he & wife just celebrated their 40th anniversary & kids gave them a huge party...  BP controlled on his 3 meds- 142/88 today & we reviewed diet,  exercise, wt reduction strategies;  Lipids look good on his Lopid rx w/ FLPs in the spring that have been within parameters;  He uses Alpraz prn to help him sleep...    We reviewed prob list, meds, xrays and labs> see below for updates >> he had the 2014 Flu vaccine 10/14...          PROBLEM LIST:    PHYSICAL EXAMINATION (ICD-V70.0) - on ASA 81mg /d... exercises regularly w/ elliptical machine, weights, etc... he had PNEUMOVAX in 2005 (age 64)... he gets the yearly Flu vaccine each autumn.  HYPERTENSION (ICD-401.9) - on ATENOLOL 100mg /d, NORVASC 10mg /d, & LOSARTAN 100mg /d (added 12/13)...  ~  1/12: BP= 128/82> taking meds regulaly & tolerating well; denies HA, fatigue, visual changes, CP, palipit, dizziness, syncope, dyspnea, edema, etc... ~  3/13: BP= 160/92 & he has gained 18# to 227#... We discussed options for adding meds now vs short term lifestyle changes- no salt, wt reducing diet + exercise (he would like to try the latter & will monitor BP at home & f/u here... ~  3/13: CXR 3/13 showed normal heart size, clear lungs, NAD.Marland KitchenMarland Kitchen EKG 3/13 showed SBrady, rate50, WNL/ NAD... ~  6/13:  BP=150/90 after a stressful day etc; he takes his Aten/ Amlod regularly & tol well; denies CP, palpit, SOB, edema... ~  12/13: BP= 144/86 but has run up to 150/100 at home; we decided to add LOSARTAN 100mg /d... ~  4/14: on Aten100, Amlod10, Losar100; BP= 142/80 & he denies CP, palpit, dizzy, SOB, edema, etc; he will monitor BP at home & work on wt reduction. ~  11/14: on Aten100, Amlod10, Losar100; BP= 142/88 & he remains asymptomatic...  VENOUS INSUFFICIENCY (ICD-459.81) - he knows to elim sodium, elevate legs, wear support hose...   HYPERLIPIDEMIA, BORDERLINE (ICD-272.4) - INTOL to Statin meds... on GEMFIBRIZOL 600mg Bid w/ good control- his Chol has been over 300 in the past & intol to statins w/ incr musc enz. ~  FLP 7/08 (wt=218#) showed TChol 154, TG 98, HDL 44, LDL 90... ~  FLP 10/09 (wt=222#) showed TChol  183, TG 86, HDL 48, LDL 118... rec> same med + better diet. ~  FLP 10/10 (wt=216#) showed TChol 173, TG 99, HDL 53, LDL 101 ~  FLP 1/12 (wt=209#) on Gemfib600Bid showed TChol 154, TG 53, HDL 57, LDL 87 ~  FLP 3/13 (wt=227#) on Gemfib600Bid showed TChol 151, TG 101, HDL 52, LDL 79 ~  FLP 4/14 (wt=230#) on Lipid600Bid showed TChol 132, TG 119, HDL 46, LDL 63  Hx of GASTRITIS (ICD-535.50) - on OMEPRAZOLE 40mg  Prn... denies nausea, vomiting, heartburn, diarrhea, constipation, blood in stool, abdominal pain, swelling, gas...  ~  EGD 7/07 by Robert [name] showed gastritis & duodenitis- HPylori neg...  IRRITABLE BOWEL SYNDROME (ICD-564.1) - Hx IBS w/ constipation...  COLONIC POLYPS (ICD-211.3) - s/p resection villous adenoma 7/11... HEMORRHOIDS >>  ~  colonoscopy 7/07 by Robert [name] was WNL. ~  pt saw some rectal blood 5/11> eval Robert [name] w/ colonoscopy revealing a large multilobulated sessile polyp at hepatic flexure- bx= tubulovillous adenoma & he had a lap-assisted right hemicolectomy 7/11 by DrWilson; surg path showed high grade dysplasia, no cancer, neg nodes... Robert [name] plans f/u colon is 47yrs. ~  6/14: he had f/u colonoscopy by Robert [name]> neg w/o lesions, anastomosis was clean, sm ext hems noted=> he has AnalpramHC for prn use...  Hx of URINARY TRACT INFECTION (ICD-599.0) - he has been evaluated by DrWrenn in 2001 w/ prostatitis... he has some nocturia & we discussed strategies to decr this...  Hx of ERECTILE DYSFUNCTION, MILD (ICD-302.72) - on VIAGRA 100mg Prn... ~  Labs 1/12 showed PSA= 1.96 ~  3/13: Robert [name] wonders about Low-T; Testos level = 537;  PSA remains normal= 1.40 ~  Labs 4/14 showed PSA = 0.55  Hx of MYALGIA (ICD-729.1) - s/p eval from DrZieminski in 2005 & his CPK elevations were believed secondary to prev Statin Rx...  ANXIETY (ICD-300.00) - uses ALPRAZOLAM 0.5mg - 1/2 to 1 tab Tid Prn...   Past Surgical History  Procedure Laterality Date  . Appendectomy    . Vasectomy     . Colon surgery  07/2009    Dr. Neita Carp tubulovillous adenoma    Outpatient Encounter Prescriptions as of 11/17/2012  Medication Sig  . ALPRAZolam (XANAX) 0.5 MG tablet Take 1/2 -1 tablet by mouth three times daily as needed for nerves  . amLODipine (NORVASC) 10 MG tablet Take 1 tablet (10 mg total) by mouth daily.  . Ascorbic Acid (VITAMIN C) 1000 MG tablet Take 1,000 mg by mouth daily.  Marland Kitchen aspirin 81 MG tablet Take 81 mg by mouth daily.  Marland Kitchen atenolol (TENORMIN) 100 MG tablet Take 1 tablet (100 mg total) by mouth daily.  Marland Kitchen GARCINIA CAMBOGIA-CHROMIUM PO Take by mouth. Take bid  . gemfibrozil (LOPID) 600 MG tablet Take 1 tablet (600 mg total) by mouth 2 (two) times daily.  Marland Kitchen levocetirizine (XYZAL) 5 MG tablet Take 1 tablet (5 mg total) by mouth every evening.  Marland Kitchen losartan (COZAAR) 100 MG tablet Take 1 tablet (100 mg total) by mouth daily.  . Multiple Vitamins-Minerals (CENTRUM SILVER ULTRA MENS) TABS TAKE 1 TABLET BY MOUTH EVERY DAY  . Omega-3 Fatty Acids (FISH OIL) 1000 MG CPDR Take by mouth daily.  . pramoxine-hydrocortisone (ANALPRAM HC) cream Apply topically at bedtime as needed.  . sildenafil (REVATIO) 20 MG tablet Take as directed  . sildenafil (VIAGRA) 100 MG tablet Take as directed  . tadalafil (CIALIS) 20 MG tablet Take as directed  . [DISCONTINUED] Multiple Vitamin (MULTIVITAMIN) tablet Take 1 tablet by mouth daily.    Allergies  Allergen Reactions  . Atorvastatin     REACTION: INTOL to Stains w/ myalgias    Current Medications, Allergies, Past Medical History, Past Surgical History, Family History, and Social History were reviewed in [employer] record.   Review of Systems    He notes sm volume hematochezia recently.  The patient denies fever, chills, sweats, anorexia, fatigue, weakness, malaise, weight loss, sleep disorder, blurring, diplopia, eye irritation, eye discharge, vision  loss, eye pain, photophobia, earache, ear discharge, tinnitus,  decreased hearing, nasal congestion, nosebleeds, sore throat, hoarseness, chest pain, palpitations, syncope, dyspnea on exertion, orthopnea, PND, peripheral edema, cough, dyspnea at rest, excessive sputum, hemoptysis, wheezing, pleurisy, nausea, vomiting, diarrhea, constipation, change in bowel habits, abdominal pain, melena, jaundice, gas/bloating, indigestion/heartburn, dysphagia, odynophagia, dysuria, hematuria, urinary frequency, urinary hesitancy, nocturia, incontinence, back pain, joint pain, joint swelling, muscle cramps, muscle weakness, stiffness, arthritis, sciatica, restless legs, leg pain at night, leg pain with exertion, rash, itching, dryness, suspicious lesions, paralysis, paresthesias, seizures, tremors, vertigo, transient blindness, frequent falls, frequent headaches, difficulty walking, depression, anxiety, memory loss, confusion, cold intolerance, heat intolerance, polydipsia, polyphagia, polyuria, unusual weight change, abnormal bruising, bleeding, enlarged lymph nodes, urticaria, allergic rash, hay fever, and recurrent infections.     Objective:   Physical Exam    WD, WN, 62 y/o BM in NAD... body habitus is mesomorphic... GENERAL:  Alert & oriented; pleasant & cooperative... HEENT:  [place]/AT, EOM-wnl, PERRLA, EACs-clear, TMs-wnl, NOSE-clear, THROAT-clear & wnl. NECK:  Supple w/ full ROM; no JVD; normal carotid impulses w/o bruits; no thyromegaly or nodules palpated; no lymphadenopathy. CHEST:  Clear to P & A; without wheezes/ rales/ or rhonchi. HEART:  Regular Rhythm; without murmurs/ rubs/ or gallops. ABDOMEN:  Soft & nontender; normal bowel sounds; no organomegaly or masses detected.  (RECTAL:  Neg - prostate 2+ & nontender w/o nodules; stool hematest neg) EXT:  without deformities, mild arthritic changes; no varicose veins/ +venous insuffic/ no edema. NEURO:  CN's intact; motor testing normal; sensory testing normal; gait normal & balance OK. DERM:  No lesions noted; no rash  etc...  RADIOLOGY DATA:  Reviewed in the EPIC EMR & discussed w/ the patient...  LABORATORY DATA:  Reviewed in the EPIC EMR & discussed w/ the patient...   Assessment:     Hematochezia> likely from his ext hems as noted on his f/u colon 6/14 which was otherw clear; Rec stool softeners, avoid straining, use the AnalpramHC cream (refilled)...   HBP>  BP is now better controlled on 3 meds listed....  HYPERLIPID>  On Gemfib600Bid and FLP looks ok; needs diet efforts as noted...  Hx Gastritis>  On Prilosec OTC as needed...  IBS/ Colon Polyps>  He had large dysplastic polyp removed & right hemicolectomy 5/11; f/u colon 6/14 was neg...  GU> Hx prostatitis, ED>  We checked Testos level at his request- WNL.Marland KitchenMarland Kitchen  Anxiety>  Stable on prn Alpraz...     Plan:     Patient's Medications  New Prescriptions   No medications on file  Previous Medications   ALPRAZOLAM (XANAX) 0.5 MG TABLET    Take 1/2 -1 tablet by mouth three times daily as needed for nerves   AMLODIPINE (NORVASC) 10 MG TABLET    Take 1 tablet (10 mg total) by mouth daily.   ASCORBIC ACID (VITAMIN C) 1000 MG TABLET    Take 1,000 mg by mouth daily.   ASPIRIN 81 MG TABLET    Take 81 mg by mouth daily.   ATENOLOL (TENORMIN) 100 MG TABLET    Take 1 tablet (100 mg total) by mouth daily.   GARCINIA CAMBOGIA-CHROMIUM PO    Take by mouth. Take bid   GEMFIBROZIL (LOPID) 600 MG TABLET    Take 1 tablet (600 mg total) by mouth 2 (two) times daily.   LEVOCETIRIZINE (XYZAL) 5 MG TABLET    Take 1 tablet (5 mg total) by mouth every evening.   LOSARTAN (COZAAR) 100 MG TABLET  Take 1 tablet (100 mg total) by mouth daily.   MULTIPLE VITAMINS-MINERALS (CENTRUM SILVER ULTRA MENS) TABS    TAKE 1 TABLET BY MOUTH EVERY DAY   OMEGA-3 FATTY ACIDS (FISH OIL) 1000 MG CPDR    Take by mouth daily.   SILDENAFIL (REVATIO) 20 MG TABLET    Take as directed   SILDENAFIL (VIAGRA) 100 MG TABLET    Take as directed   TADALAFIL (CIALIS) 20 MG TABLET    Take as  directed  Modified Medications   Modified Medication Previous Medication   PRAMOXINE-HYDROCORTISONE (ANALPRAM HC) CREAM pramoxine-hydrocortisone (ANALPRAM HC) cream      Apply as directed    Apply topically at bedtime as needed.  Discontinued Medications   MULTIPLE VITAMIN (MULTIVITAMIN) TABLET    Take 1 tablet by mouth daily.

## 2012-11-20 ENCOUNTER — Telehealth: Payer: Self-pay | Admitting: *Deleted

## 2012-11-20 NOTE — Telephone Encounter (Signed)
Received PA form on pt pramosone 1% cream Phone #: [phone] id: [id number] This was approced until 11/20/2013 approval # [id number] I called [facility] and is aware.

## 2012-12-08 ENCOUNTER — Other Ambulatory Visit: Payer: Self-pay | Admitting: Pulmonary Disease

## 2012-12-11 ENCOUNTER — Other Ambulatory Visit: Payer: Self-pay | Admitting: Pulmonary Disease

## 2012-12-15 ENCOUNTER — Other Ambulatory Visit: Payer: Self-pay | Admitting: Pulmonary Disease

## 2013-01-02 ENCOUNTER — Other Ambulatory Visit: Payer: Self-pay | Admitting: Pulmonary Disease

## 2013-01-05 ENCOUNTER — Other Ambulatory Visit: Payer: Self-pay | Admitting: Pulmonary Disease

## 2013-02-07 ENCOUNTER — Other Ambulatory Visit: Payer: Self-pay | Admitting: Pulmonary Disease

## 2013-03-05 ENCOUNTER — Encounter: Payer: Self-pay | Admitting: Adult Health

## 2013-03-05 ENCOUNTER — Ambulatory Visit (INDEPENDENT_AMBULATORY_CARE_PROVIDER_SITE_OTHER): Payer: BC Managed Care – PPO | Admitting: Adult Health

## 2013-03-05 VITALS — BP 146/86 | HR 55 | Temp 98.0°F | Ht 71.0 in | Wt 232.8 lb

## 2013-03-05 DIAGNOSIS — L989 Disorder of the skin and subcutaneous tissue, unspecified: Secondary | ICD-10-CM

## 2013-03-05 DIAGNOSIS — L98499 Non-pressure chronic ulcer of skin of other sites with unspecified severity: Secondary | ICD-10-CM

## 2013-03-05 NOTE — Patient Instructions (Signed)
Wash area with soap and water , pat dry , apply neosporin and cover with bandage.  No peroxide or alcohol to area  We are referring you to dermatology  Please contact office for sooner follow up if symptoms do not improve or worsen or seek emergency care  Dr. Lenna Gilford is retiring and we will need to set you up with a new Primary Care provider.  Please let us know if you would like [place] to assist with this referral.

## 2013-03-09 NOTE — Progress Notes (Signed)
Subjective:    Patient ID: Robert [name], male    DOB: 03-Jun-1950, 63 y.o.   MRN: [id number]  HPI  03/05/2013 Acute OV  Complains of  lesion on right 3rd digit.  lesion has been present and stable in size X8 mos.  Pt does not recall any injury to cause lesion.  Will "puff up and puss comes out" if he mashes on it or if he uses cleaner with bare hands. No redness . Mild tenderness. No fever, chest pain , edema or n/v/     PHYSICAL EXAMINATION (ICD-V70.0) - on ASA 81mg /d... exercises regularly w/ elliptical machine, weights, etc... he had PNEUMOVAX in 2005 (age 78)... he gets the yearly Flu vaccine each autumn.  HYPERTENSION (ICD-401.9) - on ATENOLOL 100mg /d, NORVASC 10mg /d, & LOSARTAN 100mg /d (added 12/13)...  ~  1/12: BP= 128/82> taking meds regulaly & tolerating well; denies HA, fatigue, visual changes, CP, palipit, dizziness, syncope, dyspnea, edema, etc... ~  3/13: BP= 160/92 & he has gained 18# to 227#... We discussed options for adding meds now vs short term lifestyle changes- no salt, wt reducing diet + exercise (he would like to try the latter & will monitor BP at home & f/u here... ~  3/13: CXR 3/13 showed normal heart size, clear lungs, NAD.Marland KitchenMarland Kitchen EKG 3/13 showed SBrady, rate50, WNL/ NAD... ~  6/13:  BP=150/90 after a stressful day etc; he takes his Aten/ Amlod regularly & tol well; denies CP, palpit, SOB, edema... ~  12/13: BP= 144/86 but has run up to 150/100 at home; we decided to add LOSARTAN 100mg /d... ~  4/14: on Aten100, Amlod10, Losar100; BP= 142/80 & he denies CP, palpit, dizzy, SOB, edema, etc; he will monitor BP at home & work on wt reduction. ~  11/14: on Aten100, Amlod10, Losar100; BP= 142/88 & he remains asymptomatic...  VENOUS INSUFFICIENCY (ICD-459.81) - he knows to elim sodium, elevate legs, wear support hose...   HYPERLIPIDEMIA, BORDERLINE (ICD-272.4) - INTOL to Statin meds... on GEMFIBRIZOL 600mg Bid w/ good control- his Chol has been over 300 in the past & intol to  statins w/ incr musc enz. ~  FLP 7/08 (wt=218#) showed TChol 154, TG 98, HDL 44, LDL 90... ~  [place] 10/09 (wt=222#) showed TChol 183, TG 86, HDL 48, LDL 118... rec> same med + better diet. ~  FLP 10/10 (wt=216#) showed TChol 173, TG 99, HDL 53, LDL 101 ~  FLP 1/12 (wt=209#) on Gemfib600Bid showed TChol 154, TG 53, HDL 57, LDL 87 ~  FLP 3/13 (wt=227#) on Gemfib600Bid showed TChol 151, TG 101, HDL 52, LDL 79 ~  FLP 4/14 (wt=230#) on Lipid600Bid showed TChol 132, TG 119, HDL 46, LDL 63  Hx of GASTRITIS (ICD-535.50) - on OMEPRAZOLE 40mg  Prn... denies nausea, vomiting, heartburn, diarrhea, constipation, blood in stool, abdominal pain, swelling, gas...  ~  EGD 7/07 by DrPatterson showed gastritis & duodenitis- HPylori neg...  IRRITABLE BOWEL SYNDROME (ICD-564.1) - Hx IBS w/ constipation...  COLONIC POLYPS (ICD-211.3) - s/p resection villous adenoma 7/11... HEMORRHOIDS >>  ~  colonoscopy 7/07 by DrPatterson was WNL. ~  pt saw some rectal blood 5/11> eval DrPatterson w/ colonoscopy revealing a large multilobulated sessile polyp at hepatic flexure- bx= tubulovillous adenoma & he had a lap-assisted right hemicolectomy 7/11 by DrWilson; surg path showed high grade dysplasia, no cancer, neg nodes... DrPatterson plans f/u colon is 68yrs. ~  6/14: he had f/u colonoscopy by DrPatterson> neg w/o lesions, anastomosis was clean, sm ext hems noted=> he has AnalpramHC for prn  use...  Hx of URINARY TRACT INFECTION (ICD-599.0) - he has been evaluated by DrWrenn in 2001 w/ prostatitis... he has some nocturia & we discussed strategies to decr this...  Hx of ERECTILE DYSFUNCTION, MILD (ICD-302.72) - on VIAGRA 100mg Prn... ~  Labs 1/12 showed PSA= 1.96 ~  3/13: Robert [name] wonders about Low-T; Testos level = 537;  PSA remains normal= 1.40 ~  Labs 4/14 showed PSA = 0.55  Hx of MYALGIA (ICD-729.1) - s/p eval from [place] in 2005 & his CPK elevations were believed secondary to prev Statin Rx...  ANXIETY (ICD-300.00) - uses  ALPRAZOLAM 0.5mg - 1/2 to 1 tab Tid Prn...   Review of Systems Constitutional:   No  weight loss, night sweats,  Fevers, chills, fatigue, or  lassitude.  HEENT:   No headaches,  Difficulty swallowing,  Tooth/dental problems, or  Sore throat,                No sneezing, itching, ear ache, nasal congestion, post nasal drip,   CV:  No chest pain,  Orthopnea, PND, swelling in lower extremities, anasarca, dizziness, palpitations, syncope.   GI  No heartburn, indigestion, abdominal pain, nausea, vomiting, diarrhea, change in bowel habits, loss of appetite, bloody stools.   Resp: No shortness of breath with exertion or at rest.  No excess mucus, no productive cough,  No non-productive cough,  No coughing up of blood.  No change in color of mucus.  No wheezing.  No chest wall deformity   GU: no dysuria, change in color of urine, no urgency or frequency.  No flank pain, no hematuria   MS:  No joint pain or swelling.  No decreased range of motion.  No back pain.  Psych:  No change in mood or affect. No depression or anxiety.  No memory loss.         Objective:   Physical Exam GEN: A/Ox3; pleasant , NAD, well nourished   HEENT:  [place]/AT,  EACs-clear, TMs-wnl, NOSE-clear, THROAT-clear, no lesions, no postnasal drip or exudate noted.   NECK:  Supple w/ fair ROM; no JVD; normal carotid impulses w/o bruits; no thyromegaly or nodules palpated; no lymphadenopathy.  RESP  Clear  P & A; w/o, wheezes/ rales/ or rhonchi.no accessory muscle use, no dullness to percussion  CARD:  RRR, no m/r/g  , no peripheral edema, pulses intact, no cyanosis or clubbing.  GI:   Soft & nt; nml bowel sounds; no organomegaly or masses detected.  Musco: Warm bil, no deformities or joint swelling noted.   Neuro: alert, no focal deficits noted.    Skin: Warm, along right 3rd digit near distal tip on dorsal surface w/ small 0.25cm circular lesion /ulcerated area , no redness or drainage          Assessment &  Plan:

## 2013-03-10 DIAGNOSIS — L98499 Non-pressure chronic ulcer of skin of other sites with unspecified severity: Secondary | ICD-10-CM | POA: Insufficient documentation

## 2013-03-10 NOTE — Assessment & Plan Note (Signed)
Right 3 rd digit ulcerated lesion ? Etiology present x 8 month  Advised to stop squeezing this area .   Plan  Wash area with soap and water , pat dry , apply neosporin and cover with bandage.  No peroxide or alcohol to area  We are referring you to dermatology  Please contact office for sooner follow up if symptoms do not improve or worsen or seek emergency care

## 2013-03-13 ENCOUNTER — Encounter: Payer: Self-pay | Admitting: Pulmonary Disease

## 2013-03-13 ENCOUNTER — Telehealth: Payer: Self-pay | Admitting: Pulmonary Disease

## 2013-03-13 NOTE — Telephone Encounter (Signed)
Looked in epic. It does not document who pt will see for finger. Please advise PCC's thanks

## 2013-03-17 NOTE — Telephone Encounter (Signed)
appt to see rebecca dawson np 04/06/13@3pm  lmtcb Joellen [place]

## 2013-03-17 NOTE — Telephone Encounter (Signed)
Pt seen by TP 3.5.15 for lesion on finger and referred to dermatology No mention of the office name or provider name in the referral note:  Referral Notes      Type Date User    General 03/06/2013 12:16 PM Brien Few E        Note    03/26/13@2 :00pm         Type Date User    Provider Comments 03/05/2013  5:19 PM Ronnald Ramp, Gaetano Romberger E       Note    Please refer pt to Dermatology for lesion on the right third finger.  Thanks.    Left detailed message on named voice mail informing pt we are looking into this for him and will call him with the physian's name.  Advised pt to call if he has any questions/concerns in the meantime.  Libby please advise what practice/provider pt was referred to, thank you.

## 2013-03-18 NOTE — Telephone Encounter (Signed)
APPT 04/06/13 with rebecca dawson pt aware Joellen Jersey

## 2013-05-12 IMAGING — CR DG CHEST 2V
2 series · 2 of 2 positions shown · non-contrast
Comparison: 01/10/2010 and 10/04/2008.

CLINICAL DATA: Physical examination.  Nonsmoker without acute
complaints.

CHEST - 2 VIEW

[view not recorded (1 of 2)]
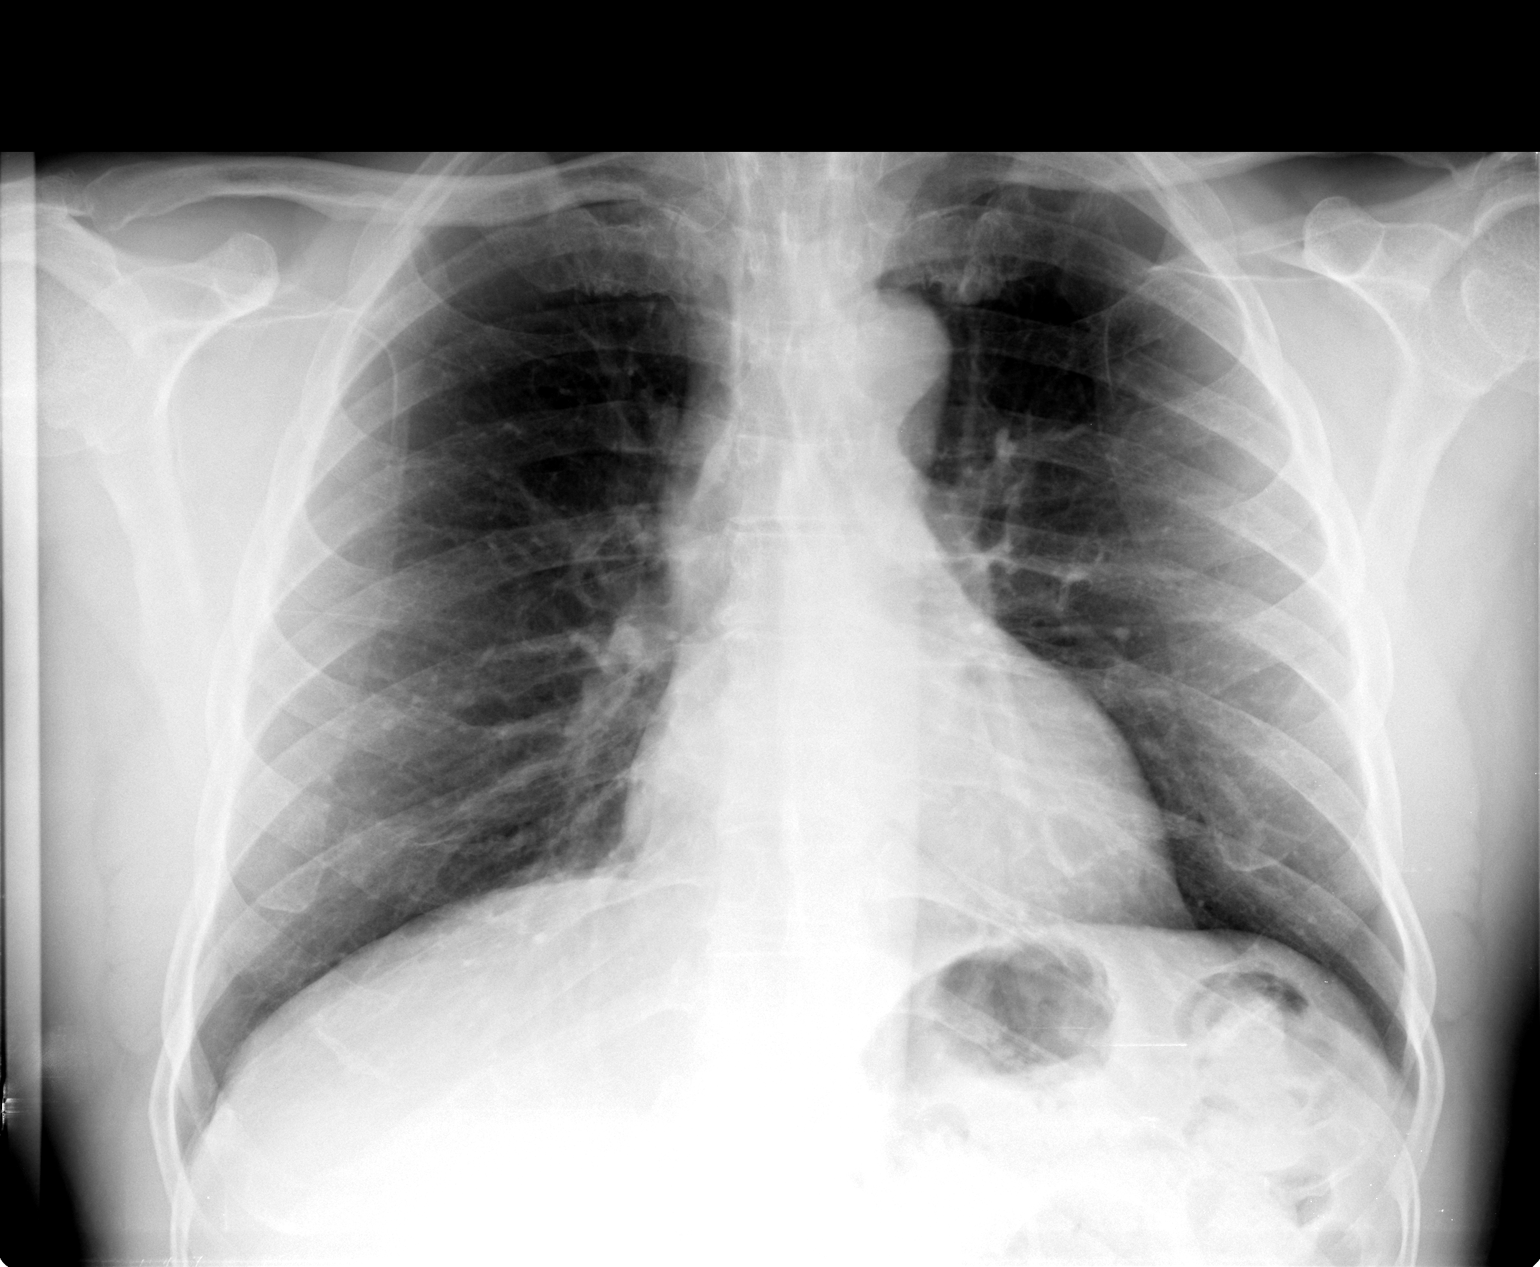

[view not recorded (2 of 2)]
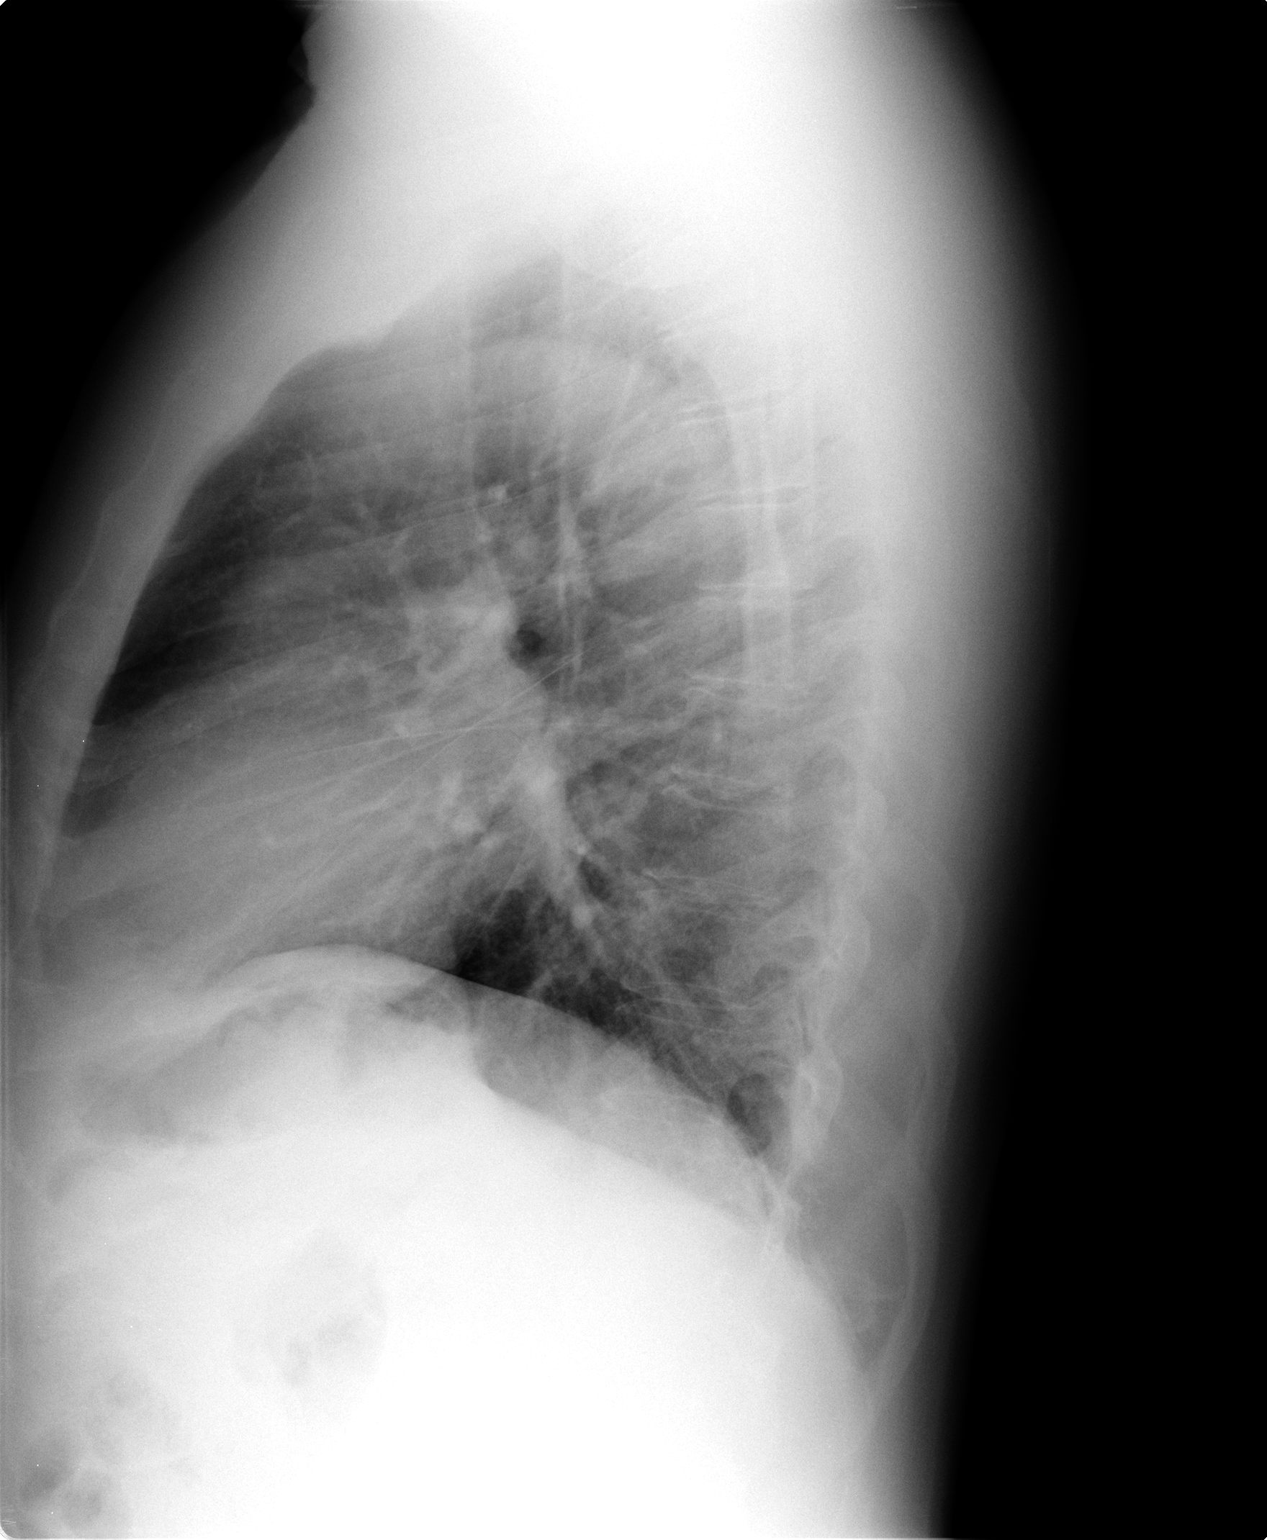

[2 of 2 positions shown; findings below may reference images not displayed]

FINDINGS: The heart size and mediastinal contours are normal. The
lungs are clear. There is no pleural effusion or pneumothorax. No
acute osseous findings are identified.
IMPRESSION: Stable examination.  No active cardiopulmonary process.

## 2013-05-20 ENCOUNTER — Other Ambulatory Visit (INDEPENDENT_AMBULATORY_CARE_PROVIDER_SITE_OTHER): Payer: BC Managed Care – PPO

## 2013-05-20 ENCOUNTER — Encounter: Payer: Self-pay | Admitting: Internal Medicine

## 2013-05-20 ENCOUNTER — Ambulatory Visit (INDEPENDENT_AMBULATORY_CARE_PROVIDER_SITE_OTHER): Payer: BC Managed Care – PPO | Admitting: Internal Medicine

## 2013-05-20 ENCOUNTER — Ambulatory Visit: Payer: BC Managed Care – PPO | Admitting: Pulmonary Disease

## 2013-05-20 VITALS — BP 130/86 | HR 68 | Temp 97.3°F | Resp 16 | Ht 71.0 in | Wt 226.0 lb

## 2013-05-20 DIAGNOSIS — IMO0001 Reserved for inherently not codable concepts without codable children: Secondary | ICD-10-CM

## 2013-05-20 DIAGNOSIS — Z0000 Encounter for general adult medical examination without abnormal findings: Secondary | ICD-10-CM | POA: Diagnosis not present

## 2013-05-20 DIAGNOSIS — I10 Essential (primary) hypertension: Secondary | ICD-10-CM | POA: Diagnosis not present

## 2013-05-20 DIAGNOSIS — K644 Residual hemorrhoidal skin tags: Secondary | ICD-10-CM | POA: Diagnosis not present

## 2013-05-20 DIAGNOSIS — F411 Generalized anxiety disorder: Secondary | ICD-10-CM

## 2013-05-20 DIAGNOSIS — R609 Edema, unspecified: Secondary | ICD-10-CM | POA: Insufficient documentation

## 2013-05-20 DIAGNOSIS — D126 Benign neoplasm of colon, unspecified: Secondary | ICD-10-CM

## 2013-05-20 LAB — CBC WITH DIFFERENTIAL/PLATELET
Basophils Absolute: 0 10*3/uL (ref 0.0–0.1)
Basophils Relative: 0.6 % (ref 0.0–3.0)
EOS ABS: 0.3 10*3/uL (ref 0.0–0.7)
Eosinophils Relative: 8.5 % — ABNORMAL HIGH (ref 0.0–5.0)
HCT: 39.6 % (ref 39.0–52.0)
Hemoglobin: 13.4 g/dL (ref 13.0–17.0)
LYMPHS ABS: 1.6 10*3/uL (ref 0.7–4.0)
Lymphocytes Relative: 44.5 % (ref 12.0–46.0)
MCHC: 33.8 g/dL (ref 30.0–36.0)
MCV: 84.8 fl (ref 78.0–100.0)
MONO ABS: 0.4 10*3/uL (ref 0.1–1.0)
Monocytes Relative: 11.7 % (ref 3.0–12.0)
NEUTROS PCT: 34.7 % — AB (ref 43.0–77.0)
Neutro Abs: 1.2 10*3/uL — ABNORMAL LOW (ref 1.4–7.7)
PLATELETS: 257 10*3/uL (ref 150.0–400.0)
RBC: 4.67 Mil/uL (ref 4.22–5.81)
RDW: 14 % (ref 11.5–15.5)
WBC: 3.6 10*3/uL — ABNORMAL LOW (ref 4.0–10.5)

## 2013-05-20 LAB — BASIC METABOLIC PANEL
BUN: 13 mg/dL (ref 6–23)
CHLORIDE: 106 meq/L (ref 96–112)
CO2: 26 meq/L (ref 19–32)
CREATININE: 1.3 mg/dL (ref 0.4–1.5)
Calcium: 9.7 mg/dL (ref 8.4–10.5)
GFR: 71.62 mL/min (ref >60.00)
GLUCOSE: 88 mg/dL (ref 70–99)
Potassium: 3.8 mEq/L (ref 3.5–5.1)
Sodium: 139 mEq/L (ref 135–145)

## 2013-05-20 LAB — TSH: TSH: 1.42 u[IU]/mL (ref 0.35–4.50)

## 2013-05-20 LAB — URINALYSIS
Bilirubin Urine: NEGATIVE
HGB URINE DIPSTICK: NEGATIVE
Ketones, ur: NEGATIVE
Leukocytes, UA: NEGATIVE
NITRITE: NEGATIVE
Specific Gravity, Urine: 1.02 (ref 1.000–1.030)
Total Protein, Urine: NEGATIVE
URINE GLUCOSE: NEGATIVE
Urobilinogen, UA: 0.2 (ref 0.0–1.0)
pH: 6 (ref 5.0–8.0)

## 2013-05-20 LAB — HEPATIC FUNCTION PANEL
ALT: 34 U/L (ref 0–53)
AST: 51 U/L — ABNORMAL HIGH (ref 0–37)
Albumin: 4.1 g/dL (ref 3.5–5.2)
Alkaline Phosphatase: 71 U/L (ref 39–117)
BILIRUBIN TOTAL: 0.7 mg/dL (ref 0.2–1.2)
Bilirubin, Direct: 0.2 mg/dL (ref 0.0–0.3)
Total Protein: 7.5 g/dL (ref 6.0–8.3)

## 2013-05-20 LAB — LIPID PANEL
CHOL/HDL RATIO: 3
CHOLESTEROL: 127 mg/dL (ref 0–200)
HDL: 45.7 mg/dL (ref >39.00)
LDL Cholesterol: 69 mg/dL (ref 0–99)
TRIGLYCERIDES: 64 mg/dL (ref 0.0–149.0)
VLDL: 12.8 mg/dL (ref 0.0–40.0)

## 2013-05-20 LAB — PSA: PSA: 0.59 ng/mL (ref 0.10–4.00)

## 2013-05-20 LAB — CK: Total CK: 510 U/L — ABNORMAL HIGH (ref 7–232)

## 2013-05-20 MED ORDER — GEMFIBROZIL 600 MG PO TABS: 600.0000 mg | ORAL_TABLET | Freq: Two times a day (BID) | ORAL | Status: DC

## 2013-05-20 MED ORDER — ALPRAZOLAM 0.5 MG PO TABS: 0.5000 mg | ORAL_TABLET | Freq: Two times a day (BID) | ORAL | Status: DC | PRN

## 2013-05-20 MED ORDER — VITAMIN D 1000 UNITS PO TABS: 1000.0000 [IU] | ORAL_TABLET | Freq: Every day | ORAL | Status: AC

## 2013-05-20 MED ORDER — SILDENAFIL CITRATE 100 MG PO TABS: ORAL_TABLET | ORAL | Status: DC

## 2013-05-20 MED ORDER — ATENOLOL 100 MG PO TABS: 100.0000 mg | ORAL_TABLET | Freq: Every day | ORAL | Status: DC

## 2013-05-20 MED ORDER — LEVOCETIRIZINE DIHYDROCHLORIDE 5 MG PO TABS: 5.0000 mg | ORAL_TABLET | Freq: Every evening | ORAL | Status: DC

## 2013-05-20 MED ORDER — LOSARTAN POTASSIUM 100 MG PO TABS: 100.0000 mg | ORAL_TABLET | Freq: Every day | ORAL | Status: DC

## 2013-05-20 MED ORDER — PRAMOXINE-HC 1-1 % EX CREA: TOPICAL_CREAM | CUTANEOUS | Status: DC

## 2013-05-20 MED ORDER — TADALAFIL 20 MG PO TABS: 20.0000 mg | ORAL_TABLET | Freq: Every day | ORAL | Status: DC | PRN

## 2013-05-20 MED ORDER — AMLODIPINE BESYLATE 10 MG PO TABS: 10.0000 mg | ORAL_TABLET | Freq: Every day | ORAL | Status: DC

## 2013-05-20 NOTE — Assessment & Plan Note (Signed)
We discussed age appropriate health related issues, including available/recomended screening tests and vaccinations. We discussed a need for adhering to healthy diet and exercise. Labs/EKG were reviewed/ordered. All questions were answered.   

## 2013-05-20 NOTE — Assessment & Plan Note (Signed)
Resolved

## 2013-05-20 NOTE — Assessment & Plan Note (Signed)
Continue with current prn prescription therapy as reflected on the Med list.  

## 2013-05-20 NOTE — Progress Notes (Signed)
   Subjective:    Patient ID: Robert [name], male    DOB: 08-May-1950, 63 y.o.   MRN: [id number]  HPI  New pt -- was Dr Jeannine Kitten The patient is here for a wellness exam. The patient presents for a follow-up of  chronic hypertension, chronic dyslipidemia, anxiety, colon polyps    Review of Systems  Constitutional: Negative for appetite change, fatigue and unexpected weight change.  HENT: Negative for congestion, nosebleeds, sneezing, sore throat and trouble swallowing.   Eyes: Negative for itching and visual disturbance.  Respiratory: Negative for cough.   Cardiovascular: Negative for chest pain, palpitations and leg swelling.  Gastrointestinal: Negative for nausea, diarrhea, blood in stool and abdominal distention.  Genitourinary: Negative for frequency and hematuria.  Musculoskeletal: Negative for back pain, gait problem, joint swelling and neck pain.  Skin: Negative for rash.  Neurological: Negative for dizziness, tremors, speech difficulty and weakness.  Psychiatric/Behavioral: Negative for suicidal ideas, sleep disturbance, dysphoric mood and agitation. The patient is not nervous/anxious.        Objective:   Physical Exam  Constitutional: He is oriented to person, place, and time. He appears well-developed and well-nourished. No distress.  HENT:  Head: Normocephalic and atraumatic.  Right Ear: External ear normal.  Left Ear: External ear normal.  Nose: Nose normal.  Mouth/Throat: Oropharynx is clear and moist. No oropharyngeal exudate.  Eyes: Conjunctivae and EOM are normal. Pupils are equal, round, and reactive to light. Right eye exhibits no discharge. Left eye exhibits no discharge. No scleral icterus.  Neck: Normal range of motion. Neck supple. No JVD present. No tracheal deviation present. No thyromegaly present.  Cardiovascular: Normal rate, regular rhythm, normal heart sounds and intact distal pulses.  Exam reveals no gallop and no friction rub.   No murmur  heard. Pulmonary/Chest: Effort normal and breath sounds normal. No stridor. No respiratory distress. He has no wheezes. He has no rales. He exhibits no tenderness.  Abdominal: Soft. Bowel sounds are normal. He exhibits no distension and no mass. There is no tenderness. There is no rebound and no guarding.  Genitourinary: Rectum normal, prostate normal and penis normal. Guaiac negative stool. No penile tenderness.  Musculoskeletal: Normal range of motion. He exhibits edema. He exhibits no tenderness.  Lymphadenopathy:    He has no cervical adenopathy.  Neurological: He is alert and oriented to person, place, and time. He has normal reflexes. No cranial nerve deficit. He exhibits normal muscle tone. Coordination normal.  Skin: Skin is warm and dry. No rash noted. He is not diaphoretic. No erythema. No pallor.  Psychiatric: He has a normal mood and affect. His behavior is normal. Judgment and thought content normal.   Edema 1+    Assessment & Plan:

## 2013-05-20 NOTE — Assessment & Plan Note (Signed)
Continue with current prescription therapy as reflected on the Med list.  

## 2013-05-20 NOTE — Progress Notes (Signed)
Pre visit review using our clinic review tool, if applicable. No additional management support is needed unless otherwise documented below in the visit note.,bn

## 2013-05-20 NOTE — Assessment & Plan Note (Signed)
Reduce Norvasc to 5 mg/d

## 2013-05-20 NOTE — Assessment & Plan Note (Signed)
S/p surgical resection

## 2013-05-27 ENCOUNTER — Encounter: Payer: Self-pay | Admitting: Internal Medicine

## 2013-07-06 ENCOUNTER — Encounter: Payer: Self-pay | Admitting: Internal Medicine

## 2013-07-06 ENCOUNTER — Telehealth: Payer: Self-pay | Admitting: Gastroenterology

## 2013-07-06 NOTE — Telephone Encounter (Signed)
Patient has no preference for new GI MD. He reports bright, red blood in stool for the last several months. Dr. Sharlett Iles had given him a cream in the past that helped with this. He has been using it but the bleeding is getting worse not better. Denies pain. Scheduled with Tye Savoy, NP on 07/08/13 at 3:00 PM.

## 2013-07-08 ENCOUNTER — Encounter: Payer: Self-pay | Admitting: Nurse Practitioner

## 2013-07-08 ENCOUNTER — Ambulatory Visit (INDEPENDENT_AMBULATORY_CARE_PROVIDER_SITE_OTHER): Payer: BC Managed Care – PPO | Admitting: Nurse Practitioner

## 2013-07-08 VITALS — BP 144/86 | HR 64 | Ht 71.0 in | Wt 228.2 lb

## 2013-07-08 DIAGNOSIS — K648 Other hemorrhoids: Secondary | ICD-10-CM

## 2013-07-08 DIAGNOSIS — K625 Hemorrhage of anus and rectum: Secondary | ICD-10-CM

## 2013-07-08 NOTE — Patient Instructions (Signed)
Discontinue the Analpram since it doesn't seem to be helping. We have given you information on the [place] hemorrhoid system procedure that can be done in our office.

## 2013-07-09 ENCOUNTER — Other Ambulatory Visit: Payer: Self-pay | Admitting: Pulmonary Disease

## 2013-07-09 ENCOUNTER — Telehealth: Payer: Self-pay

## 2013-07-09 NOTE — Telephone Encounter (Signed)
LM for patient to call me back on his work and mobile numbers to see if he can come tomorrow.  Dr. Carlean Purl said we could work him in.

## 2013-07-09 NOTE — Progress Notes (Signed)
     History of Present Illness:  Patient is a 63 year old male, previously followed by Dr. Sharlett Iles. Patient has a history of adenomatous colon polyps. He is status post right hemicolectomy 2011 for a large unresectable polyp. Last surveillance colonoscopy June 2014 with findings of only small external hemorrhoids.  Patient is here today for a 2 month history of painless rectal bleeding with bowel movements. He has occasional constipation but for the most part his bowels are normal on Metamucil. Patient has been using Analpram several times a week for 1.5 months without improvement in the bleeding.  Current Medications, Allergies, Past Medical History, Past Surgical History, Family History and Social History were reviewed in [employer] record.   Physical Exam: General: Pleasant, well developed , black male in no acute distress Head: Normocephalic and atraumatic Eyes:  sclerae anicteric, conjunctiva pink  Ears: Normal auditory acuity Abdomen: Soft, non distended, non-tender. No masses, no hepatomegaly. Normal bowel sounds Rectal: old hemorrhoid tags. On anoscopy in left lateral position there was inflamed hemorrhoidal tissue on right side  Musculoskeletal: Symmetrical with no gross deformities  Neurological: Alert oriented x 4, grossly nonfocal Psychological:  Alert and cooperative. Normal mood and affect  Assessment and Recommendations:  63 year old male with 1.5 month history of painless rectal bleeding with bowel movements. He has inflamed internal hemorrhoids on anoscopy which I suspect is the source of bleeding. Patient is up-to-date on his screening colonoscopy, last one June 2014. Advised discontinuation of Analpram since it has not helped and prolonged use of these steroid preparations can cause tissue thinning. I offered hemorrhoidal banding which we can perform in the office. Patient is interested, I gave him information about the procedure to read at home .  Dr. Sharlett Iles has retired and patient will need a new gastroenterologist in our office. I have spoken with Dr. Carlean Purl who does the hemorrhoidal banding and will be happy to assume care of the patient

## 2013-07-09 NOTE — Progress Notes (Signed)
Agree with [name]'s assessment and plan. Robert Lighty E. Inayah Woodin, MD, FACG   

## 2013-07-09 NOTE — Telephone Encounter (Signed)
We will work him in 07/10/13 at 3:30pm for a banding consult.

## 2013-07-10 ENCOUNTER — Ambulatory Visit (INDEPENDENT_AMBULATORY_CARE_PROVIDER_SITE_OTHER): Payer: BC Managed Care – PPO | Admitting: Internal Medicine

## 2013-07-10 ENCOUNTER — Encounter: Payer: Self-pay | Admitting: Internal Medicine

## 2013-07-10 VITALS — BP 124/60 | HR 76 | Ht 71.0 in | Wt 227.0 lb

## 2013-07-10 DIAGNOSIS — K648 Other hemorrhoids: Secondary | ICD-10-CM

## 2013-07-10 NOTE — Progress Notes (Signed)
Patient ID: Robert [name], male   DOB: 1950-06-15, 63 y.o.   MRN: [id number]         PROCEDURE NOTE: The patient presents with symptomatic grade 1-2  hemorrhoids, having recurrent painless bleeding, requesting rubber band ligation of his/her hemorrhoidal disease.  All risks, benefits and alternative forms of therapy were described and informed consent was obtained.  In the Left Lateral Decubitus position anoscopic examination revealed grade 1-2hemorrhoids in the all position(s).  The anorectum was pre-medicated with 5% lidocaine and 0.125% NTG The decision was made to band the RA  internal hemorrhoid, and the [place] was used to perform band ligation without complication. However not much tissue was obtained so I also banded the LL hemorrhoid Digital anorectal examination was then performed to assure proper positioning of the band, and to adjust the banded tissue as required.  The patient was discharged home without pain or other issues.  Dietary and behavioral recommendations were given and along with follow-up instructions.     The following adjunctive treatments were recommended:  Benefiber  The patient will return in 2-4 weeks, he will call for  follow-up and possible additional banding as required. No complications were encountered and the patient tolerated the procedure well.  Explained it is very important that we documented cessation of the bleeding with hemorrhoid treatment, he did have a colonoscopy last year but the bleeding fails to stop with adequate treatment sigmoidoscopy or colonoscopy could be indicated.

## 2013-07-10 NOTE — Patient Instructions (Addendum)
HEMORRHOID BANDING PROCEDURE    FOLLOW-UP CARE   1. The procedure you have had should have been relatively painless since the banding of the area involved does not have nerve endings and there is no pain sensation.  The rubber band cuts off the blood supply to the hemorrhoid and the band may fall off as soon as 48 hours after the banding (the band may occasionally be seen in the toilet bowl following a bowel movement). You may notice a temporary feeling of fullness in the rectum which should respond adequately to plain Tylenol or Motrin.  2. Following the banding, avoid strenuous exercise that evening and resume full activity the next day.  A sitz bath (soaking in a warm tub) or bidet is soothing, and can be useful for cleansing the area after bowel movements.     3. To avoid constipation, take two tablespoons of natural wheat bran, natural oat bran, flax, Benefiber or any over the counter fiber supplement and increase your water intake to 7-8 glasses daily.    4. Unless you have been prescribed anorectal medication, do not put anything inside your rectum for two weeks: No suppositories, enemas, fingers, etc.  5. Occasionally, you may have more bleeding than usual after the banding procedure.  This is often from the untreated hemorrhoids rather than the treated one.  Don't be concerned if there is a tablespoon or so of blood.  If there is more blood than this, lie flat with your bottom higher than your head and apply an ice pack to the area. If the bleeding does not stop within a half an hour or if you feel faint, call our office at [phone] or go to the emergency room.  6. Problems are not common; however, if there is a substantial amount of bleeding, severe pain, chills, fever or difficulty passing urine (very rare) or other problems, you should call us at [phone] or report to the nearest emergency room.  7. Do not stay seated continuously for more than 2-3 hours for a day or two  after the procedure.  Tighten your buttock muscles 10-15 times every two hours and take 10-15 deep breaths every 1-2 hours.  Do not spend more than a few minutes on the toilet if you cannot empty your bowel; instead re-visit the toilet at a later time.    I appreciate the opportunity to care for you.   Call us back and we can set up your next appointment when you know your schedule.

## 2013-07-14 ENCOUNTER — Other Ambulatory Visit: Payer: Self-pay | Admitting: Pulmonary Disease

## 2013-07-15 ENCOUNTER — Other Ambulatory Visit: Payer: Self-pay | Admitting: Pulmonary Disease

## 2013-08-09 ENCOUNTER — Other Ambulatory Visit: Payer: Self-pay | Admitting: Pulmonary Disease

## 2013-08-19 ENCOUNTER — Other Ambulatory Visit: Payer: Self-pay

## 2013-11-17 ENCOUNTER — Ambulatory Visit (INDEPENDENT_AMBULATORY_CARE_PROVIDER_SITE_OTHER): Payer: BC Managed Care – PPO | Admitting: Internal Medicine

## 2013-11-17 ENCOUNTER — Encounter: Payer: Self-pay | Admitting: Internal Medicine

## 2013-11-17 VITALS — BP 130/88 | HR 52 | Temp 98.1°F | Wt 221.0 lb

## 2013-11-17 DIAGNOSIS — Z0000 Encounter for general adult medical examination without abnormal findings: Secondary | ICD-10-CM

## 2013-11-17 DIAGNOSIS — R748 Abnormal levels of other serum enzymes: Secondary | ICD-10-CM | POA: Insufficient documentation

## 2013-11-17 DIAGNOSIS — R0683 Snoring: Secondary | ICD-10-CM | POA: Insufficient documentation

## 2013-11-17 DIAGNOSIS — I10 Essential (primary) hypertension: Secondary | ICD-10-CM

## 2013-11-17 NOTE — Assessment & Plan Note (Signed)
Sleep test 

## 2013-11-17 NOTE — Assessment & Plan Note (Signed)
Continue with current prescription therapy as reflected on the Med list.  

## 2013-11-17 NOTE — Assessment & Plan Note (Signed)
Chronic, idiopathic  Monitor q 12 mo

## 2013-11-17 NOTE — Progress Notes (Signed)
   Subjective:    HPI    The patient presents for a follow-up of  chronic hypertension, chronic dyslipidemia, anxiety, colon polyps C/o snoring  Wt Readings from Last 3 Encounters:  11/17/13 221 lb (100.245 kg)  07/10/13 227 lb (102.967 kg)  07/08/13 228 lb 3.2 oz (103.511 kg)   BP Readings from Last 3 Encounters:  11/17/13 130/88  07/10/13 124/60  07/08/13 144/86      Review of Systems  Constitutional: Negative for appetite change, fatigue and unexpected weight change.  HENT: Negative for congestion, nosebleeds, sneezing, sore throat and trouble swallowing.   Eyes: Negative for itching and visual disturbance.  Respiratory: Negative for cough.   Cardiovascular: Negative for chest pain, palpitations and leg swelling.  Gastrointestinal: Negative for nausea, diarrhea, blood in stool and abdominal distention.  Genitourinary: Negative for frequency and hematuria.  Musculoskeletal: Negative for back pain, joint swelling, gait problem and neck pain.  Skin: Negative for rash.  Neurological: Negative for dizziness, tremors, speech difficulty and weakness.  Psychiatric/Behavioral: Negative for suicidal ideas, sleep disturbance, dysphoric mood and agitation. The patient is not nervous/anxious.        Objective:   Physical Exam  Constitutional: He is oriented to person, place, and time. He appears well-developed. No distress.  NAD  HENT:  Mouth/Throat: Oropharynx is clear and moist.  Eyes: Conjunctivae are normal. Pupils are equal, round, and reactive to light.  Neck: Normal range of motion. No JVD present. No thyromegaly present.  Cardiovascular: Normal rate, regular rhythm, normal heart sounds and intact distal pulses.  Exam reveals no gallop and no friction rub.   No murmur heard. Pulmonary/Chest: Effort normal and breath sounds normal. No respiratory distress. He has no wheezes. He has no rales. He exhibits no tenderness.  Abdominal: Soft. Bowel sounds are normal. He  exhibits no distension and no mass. There is no tenderness. There is no rebound and no guarding.  Musculoskeletal: Normal range of motion. He exhibits no edema or tenderness.  Lymphadenopathy:    He has no cervical adenopathy.  Neurological: He is alert and oriented to person, place, and time. He has normal reflexes. No cranial nerve deficit. He exhibits normal muscle tone. He displays a negative Romberg sign. Coordination and gait normal.  No meningeal signs  Skin: Skin is warm and dry. No rash noted.  Psychiatric: He has a normal mood and affect. His behavior is normal. Judgment and thought content normal.   Edema 1+  Lab Results  Component Value Date   WBC 3.6* 05/20/2013   HGB 13.4 05/20/2013   HCT 39.6 05/20/2013   PLT 257.0 05/20/2013   GLUCOSE 88 05/20/2013   CHOL 127 05/20/2013   TRIG 64.0 05/20/2013   HDL 45.70 05/20/2013   LDLCALC 69 05/20/2013   ALT 34 05/20/2013   AST 51* 05/20/2013   NA 139 05/20/2013   K 3.8 05/20/2013   CL 106 05/20/2013   CREATININE 1.3 05/20/2013   BUN 13 05/20/2013   CO2 26 05/20/2013   TSH 1.42 05/20/2013   PSA 0.59 05/20/2013   HGBA1C 6.2* 10/07/2007       Assessment & Plan:

## 2013-11-17 NOTE — Progress Notes (Signed)
Pre visit review using our clinic review tool, if applicable. No additional management support is needed unless otherwise documented below in the visit note. 

## 2014-01-04 ENCOUNTER — Encounter: Payer: Self-pay | Admitting: Internal Medicine

## 2014-01-05 ENCOUNTER — Ambulatory Visit (INDEPENDENT_AMBULATORY_CARE_PROVIDER_SITE_OTHER): Payer: BC Managed Care – PPO | Admitting: Internal Medicine

## 2014-01-05 ENCOUNTER — Encounter: Payer: Self-pay | Admitting: Internal Medicine

## 2014-01-05 VITALS — BP 158/96 | HR 64 | Temp 98.3°F | Ht 71.0 in | Wt 226.2 lb

## 2014-01-05 DIAGNOSIS — J209 Acute bronchitis, unspecified: Secondary | ICD-10-CM

## 2014-01-05 DIAGNOSIS — J310 Chronic rhinitis: Secondary | ICD-10-CM

## 2014-01-05 MED ORDER — HYDROCODONE-HOMATROPINE 5-1.5 MG/5ML PO SYRP: 5.0000 mL | ORAL_SOLUTION | Freq: Four times a day (QID) | ORAL | Status: DC | PRN

## 2014-01-05 MED ORDER — AMOXICILLIN 500 MG PO CAPS: 500.0000 mg | ORAL_CAPSULE | Freq: Three times a day (TID) | ORAL | Status: DC

## 2014-01-05 NOTE — Patient Instructions (Signed)
Plain Mucinex (NOT D) for thick secretions ;force NON dairy fluids .   Nasal cleansing in the shower as discussed with lather of mild shampoo.After 10 seconds wash off lather while  exhaling through nostrils. Make sure that all residual soap is removed to prevent irritation.  Flonase OR Nasacort AQ 1 spray in each nostril twice a day as needed. Use the "crossover" technique into opposite nostril spraying toward opposite ear @ 45 degree angle, not straight up into nostril.  Plain Allegra (NOT D )  160 daily , Loratidine 10 mg , OR Zyrtec 10 mg @ bedtime  as needed for itchy eyes & sneezing.  Caries ( cavities) of the teeth can lead to significant local and systemic infections with threat to your health.Please have dental care completed as soon as possible.

## 2014-01-05 NOTE — Progress Notes (Signed)
   Subjective:    Patient ID: Robert [name], male    DOB: 08/21/1950, 65 y.o.   MRN: [id number]  HPI  His symptoms began 01/02/14 as a tickle in the chest. This is associated with paroxysmal cough lasting less than 1 minute.  He's had scant green nasal discharge. He describes significant head congestion with  postnasal drainage  He's had some minor sneezing  He also questions some wheezing.  He has no other upper respiratory tract infection symptoms  He's never smoked and has no history of asthma.  Review of Systems Frontal headache, facial pain , dental pain, sore throat , otic pain or otic discharge denied. No fever , chills or sweats.     Objective:   Physical Exam   Pertinent or positive findings include: Pattern alopecia is present He has a goatee There is significant caries of the right maxillary teeth. His cough is slightly loose but nonproductive   General appearance:good health ;well nourished; no acute distress or increased work of breathing is present.  No  lymphadenopathy about the head, neck, or axilla noted.  Eyes: No conjunctival inflammation or lid edema is present. There is no scleral icterus. Ears:  External ear exam shows no significant lesions or deformities.  Otoscopic examination reveals clear canals, tympanic membranes are intact bilaterally without bulging, retraction, inflammation or discharge. Nose:  External nasal examination shows no deformity or inflammation. Nasal mucosa are dry without lesions or exudates. No septal dislocation or deviation.No obstruction to airflow.  Oral exam:  lips and gums are healthy appearing.There is no oropharyngeal erythema or exudate noted.  Neck:  No deformities, thyromegaly, masses, or tenderness noted.   Supple with full range of motion without pain.  Heart:  Normal rate and regular rhythm. S1 and S2 normal without gallop, murmur, click, rub or other extra sounds.  Lungs:Chest clear to auscultation; no wheezes,  rhonchi,rales ,or rubs present.No increased work of breathing.   Extremities:  No cyanosis, edema, or clubbing  noted  Skin: Warm & dry w/o tenting.      Assessment & Plan:  #1 acute bronchitis w/o bronchospasm #2 URI, acute #3 caries #4 HTN Plan: See orders and recommendations

## 2014-01-05 NOTE — Progress Notes (Signed)
Pre visit review using our clinic review tool, if applicable. No additional management support is needed unless otherwise documented below in the visit note. 

## 2014-01-11 ENCOUNTER — Other Ambulatory Visit: Payer: Self-pay | Admitting: Internal Medicine

## 2014-01-11 DIAGNOSIS — J209 Acute bronchitis, unspecified: Secondary | ICD-10-CM

## 2014-01-12 ENCOUNTER — Other Ambulatory Visit: Payer: Self-pay | Admitting: Internal Medicine

## 2014-01-12 DIAGNOSIS — J209 Acute bronchitis, unspecified: Secondary | ICD-10-CM

## 2014-01-12 MED ORDER — HYDROCODONE-HOMATROPINE 5-1.5 MG/5ML PO SYRP: 5.0000 mL | ORAL_SOLUTION | Freq: Four times a day (QID) | ORAL | Status: DC | PRN

## 2014-01-12 NOTE — Telephone Encounter (Signed)
done

## 2014-01-27 ENCOUNTER — Telehealth: Payer: Self-pay

## 2014-01-27 NOTE — Telephone Encounter (Signed)
-----   Message from Hendricks Limes, MD sent at 01/27/2014  1:27 PM EST ----- Cough syrup refill PK but he needs to see his PCP if no better

## 2014-01-27 NOTE — Telephone Encounter (Signed)
I called and left patient a message to return call. He called back and stated he no longer needs the script for the cough syrup.

## 2014-02-18 ENCOUNTER — Ambulatory Visit (INDEPENDENT_AMBULATORY_CARE_PROVIDER_SITE_OTHER): Payer: BC Managed Care – PPO | Admitting: Internal Medicine

## 2014-02-18 ENCOUNTER — Encounter: Payer: Self-pay | Admitting: Internal Medicine

## 2014-02-18 VITALS — BP 140/88 | HR 76 | Temp 97.8°F | Ht 71.0 in | Wt 222.0 lb

## 2014-02-18 DIAGNOSIS — Z87898 Personal history of other specified conditions: Secondary | ICD-10-CM

## 2014-02-18 DIAGNOSIS — R112 Nausea with vomiting, unspecified: Secondary | ICD-10-CM

## 2014-02-18 DIAGNOSIS — R1013 Epigastric pain: Secondary | ICD-10-CM

## 2014-02-18 DIAGNOSIS — R1310 Dysphagia, unspecified: Secondary | ICD-10-CM

## 2014-02-18 MED ORDER — OMEPRAZOLE 20 MG PO CPDR: 20.0000 mg | DELAYED_RELEASE_CAPSULE | Freq: Two times a day (BID) | ORAL | Status: DC

## 2014-02-18 NOTE — Progress Notes (Signed)
Pre visit review using our clinic review tool, if applicable. No additional management support is needed unless otherwise documented below in the visit note. 

## 2014-02-18 NOTE — Patient Instructions (Signed)
Reflux of gastric acid may be asymptomatic as this may occur mainly during sleep.The triggers for reflux  include stress; the "aspirin family" ; alcohol; peppermint; and caffeine (coffee, tea, cola, and chocolate). The aspirin family would include aspirin and the nonsteroidal agents such as ibuprofen &  Naproxen. Tylenol would not cause reflux. If having symptoms ; food & drink should be avoided for @ least 2 hours before going to bed.   Use an anti-inflammatory cream such as Aspercreme or Zostrix cream twice a day to the affected area as needed. In lieu of this warm moist compresses or  hot water bottle can be used. Do not apply ice .  Caries ( cavities) and plaque on the teeth can lead to significant local and systemic infections with threat to your health.Please have dental care completed as soon as possible.

## 2014-02-18 NOTE — Progress Notes (Signed)
   Subjective:    Patient ID: Robert [name], male    DOB: 07-29-1950, 64 y.o.   MRN: [id number]  HPI He developed mid abdominal pain last night approximately 11pm right before going to bed. He described it as epigastric cramping with constant pain up to a level X. No position improved the pain; sitting up and walking did help some.  Shortly thereafter he began having vomiting which recurred 6-7 times until 9 AM today. As of this morning the pain was a level IV. It is essentially resolved at this time. He does have residual queasiness..  Last night for dinner he ate chicken wings and drank cola.   He is on 81 mg of aspirin daily. He typically takes ibuprofen 3-4 pills, 1-2 times per week, mainly for elbow pain. He rarely drinks alcohol. He rarely drinks cola. He does not drink coffee. He typically has two servings of tea daily. He does not ingest chocolate frequently. He does eat peppermint candy daily. He is a nonsmoker.  He describes some food dysphagia approximately 2 times per week.  He has a history of ulcer. He also had a precancerous polyp removed laparoscopically.   Review of Systems He denies fever, chills, sweats, weight loss.  He's had no hematemesis.  Weight is been stable.  He's had no genitourinary symptoms of dysuria, pyuria, or hematuria.    Objective:   Physical Exam Pertinent or positive findings include: Pattern alopecia is present. Arcus senilis is noted. He has a severe caries of one of the right upper maxillary teeth.  General appearance :adequately nourished; in no distress. Eyes: No conjunctival inflammation or scleral icterus is present. Oral exam: Lips and gums are healthy appearing.There is no oropharyngeal erythema or exudate noted.  Heart:  Normal rate and regular rhythm. S1 and S2 normal without gallop, murmur, click, rub or other extra sounds   Lungs:Chest clear to auscultation; no wheezes, rhonchi,rales ,or rubs present.No increased work of  breathing.  Abdomen: bowel sounds normal, soft and non-tender without masses, organomegaly or hernias noted.  No guarding or rebound. No flank tenderness to percussion. Vascular : all pulses equal ; no bruits present. Skin:Warm & dry.  Intact without suspicious lesions or rashes ; no tenting Lymphatic: No lymphadenopathy is noted about the head, neck, axilla Neuro: Strength, tone normal.     Assessment & Plan:  #1 epigastric cramping pain  #2 nausea & vomiting  #3 dysphagia  #4 history of ulcer   #5 caries  Plan: See orders and recommendations

## 2014-02-19 ENCOUNTER — Ambulatory Visit: Payer: BC Managed Care – PPO | Admitting: Internal Medicine

## 2014-03-17 ENCOUNTER — Other Ambulatory Visit: Payer: Self-pay | Admitting: Internal Medicine

## 2014-04-21 ENCOUNTER — Telehealth: Payer: Self-pay | Admitting: *Deleted

## 2014-04-21 NOTE — Telephone Encounter (Signed)
Pt never pick up rx for Hydromet dated 01/12/14. Shredded script...Robert [name]

## 2014-05-19 ENCOUNTER — Encounter: Payer: Self-pay | Admitting: Internal Medicine

## 2014-05-19 ENCOUNTER — Ambulatory Visit (INDEPENDENT_AMBULATORY_CARE_PROVIDER_SITE_OTHER): Payer: BC Managed Care – PPO | Admitting: Internal Medicine

## 2014-05-19 VITALS — BP 128/88 | HR 60 | Ht 71.0 in | Wt 227.0 lb

## 2014-05-19 DIAGNOSIS — Z0000 Encounter for general adult medical examination without abnormal findings: Secondary | ICD-10-CM

## 2014-05-19 DIAGNOSIS — N289 Disorder of kidney and ureter, unspecified: Secondary | ICD-10-CM | POA: Diagnosis not present

## 2014-05-19 DIAGNOSIS — Z23 Encounter for immunization: Secondary | ICD-10-CM | POA: Diagnosis not present

## 2014-05-19 DIAGNOSIS — R0683 Snoring: Secondary | ICD-10-CM

## 2014-05-19 MED ORDER — SILDENAFIL CITRATE 100 MG PO TABS: ORAL_TABLET | ORAL | Status: DC

## 2014-05-19 MED ORDER — ALPRAZOLAM 0.5 MG PO TABS: 0.5000 mg | ORAL_TABLET | Freq: Two times a day (BID) | ORAL | Status: DC | PRN

## 2014-05-19 NOTE — Progress Notes (Signed)
Pre visit review using our clinic review tool, if applicable. No additional management support is needed unless otherwise documented below in the visit note. 

## 2014-05-19 NOTE — Assessment & Plan Note (Signed)
11/15 r/o OSA Sleep test did not go through.  Pulm cons

## 2014-05-19 NOTE — Patient Instructions (Signed)
Zostavax for shingles

## 2014-05-19 NOTE — Assessment & Plan Note (Signed)
We discussed age appropriate health related issues, including available/recomended screening tests and vaccinations. We discussed a need for adhering to healthy diet and exercise. Labs/EKG were reviewed/ordered. All questions were answered.   

## 2014-05-19 NOTE — Progress Notes (Signed)
Subjective:    Patient ID: Robert [name], male    DOB: 12/24/1950, 64 y.o.   MRN: [id number]  HPI   The patient is here for a wellness exam. C/o snoring The patient presents for a follow-up of  chronic hypertension, chronic dyslipidemia, anxiety, colon polyps  Wt Readings from Last 3 Encounters:  05/19/14 227 lb (102.967 kg)  02/18/14 222 lb 0.4 oz (100.71 kg)  01/05/14 226 lb 4 oz (102.626 kg)   BP Readings from Last 3 Encounters:  05/19/14 128/88  02/18/14 140/88  01/05/14 158/96      Review of Systems  Constitutional: Negative for appetite change, fatigue and unexpected weight change.  HENT: Negative for congestion, nosebleeds, sneezing, sore throat and trouble swallowing.   Eyes: Negative for itching and visual disturbance.  Respiratory: Negative for cough.   Cardiovascular: Negative for chest pain, palpitations and leg swelling.  Gastrointestinal: Negative for nausea, diarrhea, blood in stool and abdominal distention.  Genitourinary: Negative for frequency and hematuria.  Musculoskeletal: Negative for back pain, joint swelling, gait problem and neck pain.  Skin: Negative for rash.  Neurological: Negative for dizziness, tremors, speech difficulty and weakness.  Psychiatric/Behavioral: Negative for suicidal ideas, sleep disturbance, dysphoric mood and agitation. The patient is not nervous/anxious.        Objective:   Physical Exam  Constitutional: He is oriented to person, place, and time. He appears well-developed and well-nourished. No distress.  HENT:  Head: Normocephalic and atraumatic.  Right Ear: External ear normal.  Left Ear: External ear normal.  Nose: Nose normal.  Mouth/Throat: Oropharynx is clear and moist. No oropharyngeal exudate.  Eyes: Conjunctivae and EOM are normal. Pupils are equal, round, and reactive to light. Right eye exhibits no discharge. Left eye exhibits no discharge. No scleral icterus.  Neck: Normal range of motion. Neck supple. No JVD  present. No tracheal deviation present. No thyromegaly present.  Cardiovascular: Normal rate, regular rhythm, normal heart sounds and intact distal pulses.  Exam reveals no gallop and no friction rub.   No murmur heard. Pulmonary/Chest: Effort normal and breath sounds normal. No stridor. No respiratory distress. He has no wheezes. He has no rales. He exhibits no tenderness.  Abdominal: Soft. Bowel sounds are normal. He exhibits no distension and no mass. There is no tenderness. There is no rebound and no guarding.  Genitourinary: Rectum normal, prostate normal and penis normal. Guaiac negative stool. No penile tenderness.  Musculoskeletal: Normal range of motion. He exhibits edema. He exhibits no tenderness.  Lymphadenopathy:    He has no cervical adenopathy.  Neurological: He is alert and oriented to person, place, and time. He has normal reflexes. No cranial nerve deficit. He exhibits normal muscle tone. Coordination normal.  Skin: Skin is warm and dry. No rash noted. He is not diaphoretic. No erythema. No pallor.  Psychiatric: He has a normal mood and affect. His behavior is normal. Judgment and thought content normal.  Rectal - prostate 1+ Edema trace  Lab Results  Component Value Date   WBC 3.6* 05/20/2013   HGB 13.4 05/20/2013   HCT 39.6 05/20/2013   PLT 257.0 05/20/2013   GLUCOSE 88 05/20/2013   CHOL 127 05/20/2013   TRIG 64.0 05/20/2013   HDL 45.70 05/20/2013   LDLCALC 69 05/20/2013   ALT 34 05/20/2013   AST 51* 05/20/2013   NA 139 05/20/2013   K 3.8 05/20/2013   CL 106 05/20/2013   CREATININE 1.3 05/20/2013   BUN 13 05/20/2013   CO2  26 05/20/2013   TSH 1.42 05/20/2013   PSA 0.59 05/20/2013   HGBA1C 6.2* 10/07/2007       Assessment & Plan:

## 2014-05-20 ENCOUNTER — Other Ambulatory Visit (INDEPENDENT_AMBULATORY_CARE_PROVIDER_SITE_OTHER): Payer: BC Managed Care – PPO

## 2014-05-20 DIAGNOSIS — Z0000 Encounter for general adult medical examination without abnormal findings: Secondary | ICD-10-CM | POA: Diagnosis not present

## 2014-05-20 DIAGNOSIS — N289 Disorder of kidney and ureter, unspecified: Secondary | ICD-10-CM | POA: Insufficient documentation

## 2014-05-20 LAB — BASIC METABOLIC PANEL
BUN: 13 mg/dL (ref 6–23)
CHLORIDE: 104 meq/L (ref 96–112)
CO2: 29 meq/L (ref 19–32)
Calcium: 10.1 mg/dL (ref 8.4–10.5)
Creatinine, Ser: 1.56 mg/dL — ABNORMAL HIGH (ref 0.40–1.50)
GFR: 57.85 mL/min — AB (ref >60.00)
GLUCOSE: 107 mg/dL — AB (ref 70–99)
Potassium: 4.2 mEq/L (ref 3.5–5.1)
Sodium: 140 mEq/L (ref 135–145)

## 2014-05-20 LAB — URINALYSIS
Bilirubin Urine: NEGATIVE
HGB URINE DIPSTICK: NEGATIVE
Ketones, ur: NEGATIVE
Leukocytes, UA: NEGATIVE
Nitrite: NEGATIVE
SPECIFIC GRAVITY, URINE: 1.015 (ref 1.000–1.030)
TOTAL PROTEIN, URINE-UPE24: NEGATIVE
UROBILINOGEN UA: 0.2 (ref 0.0–1.0)
Urine Glucose: NEGATIVE
pH: 6 (ref 5.0–8.0)

## 2014-05-20 LAB — LIPID PANEL
Cholesterol: 146 mg/dL (ref 0–200)
HDL: 45 mg/dL (ref >39.00)
LDL CALC: 75 mg/dL (ref 0–99)
NonHDL: 101
TRIGLYCERIDES: 128 mg/dL (ref 0.0–149.0)
Total CHOL/HDL Ratio: 3
VLDL: 25.6 mg/dL (ref 0.0–40.0)

## 2014-05-20 LAB — CBC WITH DIFFERENTIAL/PLATELET
BASOS ABS: 0 10*3/uL (ref 0.0–0.1)
Basophils Relative: 0.6 % (ref 0.0–3.0)
EOS PCT: 8.3 % — AB (ref 0.0–5.0)
Eosinophils Absolute: 0.3 10*3/uL (ref 0.0–0.7)
HEMATOCRIT: 41.6 % (ref 39.0–52.0)
Hemoglobin: 14.1 g/dL (ref 13.0–17.0)
LYMPHS PCT: 31.7 % (ref 12.0–46.0)
Lymphs Abs: 1.2 10*3/uL (ref 0.7–4.0)
MCHC: 34 g/dL (ref 30.0–36.0)
MCV: 82 fl (ref 78.0–100.0)
MONOS PCT: 11.7 % (ref 3.0–12.0)
Monocytes Absolute: 0.4 10*3/uL (ref 0.1–1.0)
Neutro Abs: 1.8 10*3/uL (ref 1.4–7.7)
Neutrophils Relative %: 47.7 % (ref 43.0–77.0)
PLATELETS: 251 10*3/uL (ref 150.0–400.0)
RBC: 5.07 Mil/uL (ref 4.22–5.81)
RDW: 14.1 % (ref 11.5–15.5)
WBC: 3.8 10*3/uL — ABNORMAL LOW (ref 4.0–10.5)

## 2014-05-20 LAB — HEPATIC FUNCTION PANEL
ALBUMIN: 4.4 g/dL (ref 3.5–5.2)
ALK PHOS: 77 U/L (ref 39–117)
ALT: 27 U/L (ref 0–53)
AST: 41 U/L — ABNORMAL HIGH (ref 0–37)
Bilirubin, Direct: 0.1 mg/dL (ref 0.0–0.3)
TOTAL PROTEIN: 7.4 g/dL (ref 6.0–8.3)
Total Bilirubin: 0.6 mg/dL (ref 0.2–1.2)

## 2014-05-20 LAB — PSA: PSA: 0.61 ng/mL (ref 0.10–4.00)

## 2014-05-20 LAB — TSH: TSH: 1.86 u[IU]/mL (ref 0.35–4.50)

## 2014-05-20 LAB — CK: CK TOTAL: 528 U/L — AB (ref 7–232)

## 2014-05-20 NOTE — Assessment & Plan Note (Signed)
Labs  US renal

## 2014-05-26 ENCOUNTER — Encounter: Payer: Self-pay | Admitting: Internal Medicine

## 2014-05-26 NOTE — Telephone Encounter (Signed)
Pt called in and and has a some question about his labs that he is very concerned about   Best number [phone]

## 2014-05-28 NOTE — Telephone Encounter (Signed)
Called pt back he is concern about his kidney functions. He stated on mychart msg md stated that he had kidney disease & he wasn't aware.  Wanting to know what md recommend creatinine level was elevated...Robert [name]

## 2014-05-28 NOTE — Telephone Encounter (Signed)
Patient is requesting call at [phone]

## 2014-05-28 NOTE — Telephone Encounter (Signed)
Patient is requesting call back in regards to labs.  Would like call as soon as possible.  States he has been trying to get through to someone for three days now.

## 2014-06-02 ENCOUNTER — Encounter: Payer: Self-pay | Admitting: Internal Medicine

## 2014-06-08 ENCOUNTER — Telehealth: Payer: Self-pay | Admitting: Internal Medicine

## 2014-06-08 NOTE — Telephone Encounter (Signed)
Patient called regarding the referral to pulmonary. He's just wondering if there was something found in lab results that would require him to see a pulmonologist. He's not sure if insurance will pay so he wants to make sure it is absolutely necessary for him to go. Please advise patient

## 2014-06-09 NOTE — Telephone Encounter (Signed)
Pt is referred to Pulmonary to address his sleep apnea and have a test done - it should be covered Thx

## 2014-06-10 ENCOUNTER — Other Ambulatory Visit: Payer: Self-pay | Admitting: Internal Medicine

## 2014-06-10 NOTE — Telephone Encounter (Signed)
Notified pt with md response.../lmb 

## 2014-07-08 ENCOUNTER — Other Ambulatory Visit: Payer: Self-pay | Admitting: Internal Medicine

## 2014-07-19 ENCOUNTER — Other Ambulatory Visit: Payer: Self-pay | Admitting: Internal Medicine

## 2014-09-21 ENCOUNTER — Telehealth: Payer: Self-pay

## 2014-09-21 NOTE — Telephone Encounter (Signed)
PA for omeprazole started via cover my meds.   PA came back as pt is not active.   LVM for pt to call back as soon as possible.   RE: need current insurance information.

## 2014-10-16 ENCOUNTER — Other Ambulatory Visit: Payer: Self-pay | Admitting: Internal Medicine

## 2014-11-10 ENCOUNTER — Other Ambulatory Visit (INDEPENDENT_AMBULATORY_CARE_PROVIDER_SITE_OTHER): Payer: BC Managed Care – PPO

## 2014-11-10 ENCOUNTER — Encounter: Payer: Self-pay | Admitting: Internal Medicine

## 2014-11-10 ENCOUNTER — Ambulatory Visit (INDEPENDENT_AMBULATORY_CARE_PROVIDER_SITE_OTHER): Payer: BC Managed Care – PPO | Admitting: Internal Medicine

## 2014-11-10 VITALS — BP 138/86 | HR 53 | Wt 226.0 lb

## 2014-11-10 DIAGNOSIS — M791 Myalgia, unspecified site: Secondary | ICD-10-CM

## 2014-11-10 DIAGNOSIS — K648 Other hemorrhoids: Secondary | ICD-10-CM | POA: Diagnosis not present

## 2014-11-10 DIAGNOSIS — K644 Residual hemorrhoidal skin tags: Secondary | ICD-10-CM

## 2014-11-10 DIAGNOSIS — Z0000 Encounter for general adult medical examination without abnormal findings: Secondary | ICD-10-CM

## 2014-11-10 DIAGNOSIS — R748 Abnormal levels of other serum enzymes: Secondary | ICD-10-CM | POA: Diagnosis not present

## 2014-11-10 DIAGNOSIS — I10 Essential (primary) hypertension: Secondary | ICD-10-CM

## 2014-11-10 LAB — BASIC METABOLIC PANEL
BUN: 12 mg/dL (ref 6–23)
CO2: 26 mEq/L (ref 19–32)
CREATININE: 1.21 mg/dL (ref 0.40–1.50)
Calcium: 10.1 mg/dL (ref 8.4–10.5)
Chloride: 103 mEq/L (ref 96–112)
GFR: 77.44 mL/min (ref >60.00)
Glucose, Bld: 102 mg/dL — ABNORMAL HIGH (ref 70–99)
Potassium: 4.2 mEq/L (ref 3.5–5.1)
Sodium: 137 mEq/L (ref 135–145)

## 2014-11-10 LAB — HEMOGLOBIN A1C: HEMOGLOBIN A1C: 6.1 % (ref 4.6–6.5)

## 2014-11-10 MED ORDER — ALPRAZOLAM 0.5 MG PO TABS
0.5000 mg | ORAL_TABLET | Freq: Two times a day (BID) | ORAL | Status: DC | PRN
Start: 2014-11-10 — End: 2017-03-20

## 2014-11-10 MED ORDER — LOSARTAN POTASSIUM 100 MG PO TABS: 100.0000 mg | ORAL_TABLET | Freq: Every day | ORAL | Status: DC

## 2014-11-10 MED ORDER — OMEPRAZOLE 20 MG PO CPDR: 20.0000 mg | DELAYED_RELEASE_CAPSULE | Freq: Two times a day (BID) | ORAL | Status: DC

## 2014-11-10 MED ORDER — LEVOCETIRIZINE DIHYDROCHLORIDE 5 MG PO TABS: 5.0000 mg | ORAL_TABLET | Freq: Every evening | ORAL | Status: DC

## 2014-11-10 MED ORDER — GEMFIBROZIL 600 MG PO TABS: 600.0000 mg | ORAL_TABLET | Freq: Two times a day (BID) | ORAL | Status: DC

## 2014-11-10 MED ORDER — SILDENAFIL CITRATE 100 MG PO TABS: ORAL_TABLET | ORAL | Status: DC

## 2014-11-10 MED ORDER — PRAMOXINE-HC 1-1 % EX CREA: TOPICAL_CREAM | CUTANEOUS | Status: DC

## 2014-11-10 MED ORDER — VITAMIN D 1000 UNITS PO TABS: 1000.0000 [IU] | ORAL_TABLET | Freq: Every day | ORAL | Status: AC

## 2014-11-10 NOTE — Assessment & Plan Note (Signed)
Pt is on Gemfibrozil

## 2014-11-10 NOTE — Assessment & Plan Note (Signed)
Labs Amlodipine, Losartan 

## 2014-11-10 NOTE — Assessment & Plan Note (Signed)
Working out a lot

## 2014-11-10 NOTE — Progress Notes (Signed)
Subjective:  Patient ID: Robert [name], male    DOB: 10-Mar-1950  Age: 64 y.o. MRN: [id number]  CC: No chief complaint on file.   HPI Harry Loveall presents for HTN, dyslipidemia,allergies, ED f/u  Outpatient Prescriptions Prior to Visit  Medication Sig Dispense Refill  . amLODipine (NORVASC) 10 MG tablet Take 1 tablet (10 mg total) by mouth daily. 90 tablet 3  . aspirin 81 MG tablet Take 81 mg by mouth daily.    Marland Kitchen atenolol (TENORMIN) 100 MG tablet Take 1 tablet (100 mg total) by mouth daily. 90 tablet 3  . Omega-3 Fatty Acids (FISH OIL) 1000 MG CPDR Take by mouth daily.    Marland Kitchen ALPRAZolam (XANAX) 0.5 MG tablet Take 1 tablet (0.5 mg total) by mouth 2 (two) times daily as needed for anxiety. 180 tablet 1  . gemfibrozil (LOPID) 600 MG tablet TAKE 1 TABLET BY MOUTH TWICE DAILY BEFORE A MEAL 180 tablet 3  . levocetirizine (XYZAL) 5 MG tablet Take 1 tablet (5 mg total) by mouth every evening. 90 tablet 3  . losartan (COZAAR) 100 MG tablet TAKE 1 TABLET BY MOUTH EVERY DAY 90 tablet 3  . omeprazole (PRILOSEC) 20 MG capsule TAKE ONE CAPSULE BY MOUTH TWICE DAILY BEFORE A MEAL 60 capsule 5  . pramoxine-hydrocortisone (ANALPRAM HC) cream Apply as directed 30 g 6  . sildenafil (VIAGRA) 100 MG tablet Take as directed 10 tablet 5   No facility-administered medications prior to visit.    ROS Review of Systems  Objective:  BP 138/86 mmHg  Pulse 53  Wt 226 lb (102.513 kg)  SpO2 98%  BP Readings from Last 3 Encounters:  11/10/14 138/86  05/19/14 128/88  02/18/14 140/88    Wt Readings from Last 3 Encounters:  11/10/14 226 lb (102.513 kg)  05/19/14 227 lb (102.967 kg)  02/18/14 222 lb 0.4 oz (100.71 kg)    Physical Exam  Lab Results  Component Value Date   WBC 3.8* 05/20/2014   HGB 14.1 05/20/2014   HCT 41.6 05/20/2014   PLT 251.0 05/20/2014   GLUCOSE 107* 05/20/2014   CHOL 146 05/20/2014   TRIG 128.0 05/20/2014   HDL 45.00 05/20/2014   LDLCALC 75 05/20/2014   ALT 27 05/20/2014   AST 41* 05/20/2014   NA 140 05/20/2014   K 4.2 05/20/2014   CL 104 05/20/2014   CREATININE 1.56* 05/20/2014   BUN 13 05/20/2014   CO2 29 05/20/2014   TSH 1.86 05/20/2014   PSA 0.61 05/20/2014   HGBA1C 6.2* 10/07/2007    Dg Chest 2 View  03/07/2011  *RADIOLOGY REPORT* Clinical Data: Physical examination.  Nonsmoker without acute complaints. CHEST - 2 VIEW Comparison: 01/10/2010 and 10/04/2008. Findings: The heart size and mediastinal contours are normal. The lungs are clear. There is no pleural effusion or pneumothorax. No acute osseous findings are identified. IMPRESSION: Stable examination.  No active cardiopulmonary process. Original Report Authenticated By: Vivia Ewing, M.D.   Assessment & Plan:   Diagnoses and all orders for this visit:  Hemorrhoids, external -     pramoxine-hydrocortisone (ANALPRAM HC) cream; Apply as directed -     Hemoglobin A1c; Future -     Basic metabolic panel; Future -     Aldolase  Essential hypertension -     Hemoglobin A1c; Future -     Basic metabolic panel; Future -     Aldolase  Myalgia -     Hemoglobin A1c; Future -     Basic metabolic  panel; Future -     Aldolase  Elevated CK -     Hemoglobin A1c; Future -     Basic metabolic panel; Future -     Aldolase  Other orders -     ALPRAZolam (XANAX) 0.5 MG tablet; Take 1 tablet (0.5 mg total) by mouth 2 (two) times daily as needed for anxiety. -     gemfibrozil (LOPID) 600 MG tablet; Take 1 tablet (600 mg total) by mouth 2 (two) times daily before a meal. -     levocetirizine (XYZAL) 5 MG tablet; Take 1 tablet (5 mg total) by mouth every evening. -     omeprazole (PRILOSEC) 20 MG capsule; Take 1 capsule (20 mg total) by mouth 2 (two) times daily before a meal. -     sildenafil (VIAGRA) 100 MG tablet; Take as directed -     losartan (COZAAR) 100 MG tablet; Take 1 tablet (100 mg total) by mouth daily. -     cholecalciferol (VITAMIN D) 1000 UNITS tablet; Take 1 tablet (1,000 Units total)  by mouth daily.   I have changed Mr. Imes's gemfibrozil, omeprazole, and losartan. I am also having him start on cholecalciferol. Additionally, I am having him maintain his aspirin, Fish Oil, amLODipine, atenolol, ALPRAZolam, levocetirizine, pramoxine-hydrocortisone, and sildenafil.  Meds ordered this encounter  Medications  . ALPRAZolam (XANAX) 0.5 MG tablet    Sig: Take 1 tablet (0.5 mg total) by mouth 2 (two) times daily as needed for anxiety.    Dispense:  180 tablet    Refill:  1  . gemfibrozil (LOPID) 600 MG tablet    Sig: Take 1 tablet (600 mg total) by mouth 2 (two) times daily before a meal.    Dispense:  180 tablet    Refill:  3  . levocetirizine (XYZAL) 5 MG tablet    Sig: Take 1 tablet (5 mg total) by mouth every evening.    Dispense:  90 tablet    Refill:  3  . omeprazole (PRILOSEC) 20 MG capsule    Sig: Take 1 capsule (20 mg total) by mouth 2 (two) times daily before a meal.    Dispense:  180 capsule    Refill:  3  . pramoxine-hydrocortisone (ANALPRAM HC) cream    Sig: Apply as directed    Dispense:  30 g    Refill:  2  . sildenafil (VIAGRA) 100 MG tablet    Sig: Take as directed    Dispense:  10 tablet    Refill:  5  . losartan (COZAAR) 100 MG tablet    Sig: Take 1 tablet (100 mg total) by mouth daily.    Dispense:  90 tablet    Refill:  3  . cholecalciferol (VITAMIN D) 1000 UNITS tablet    Sig: Take 1 tablet (1,000 Units total) by mouth daily.    Dispense:  100 tablet    Refill:  3     Follow-up: Return in about 6 months (around 05/10/2015) for Wellness Exam.  Walker Kehr, MD

## 2014-11-10 NOTE — Progress Notes (Signed)
Pre visit review using our clinic review tool, if applicable. No additional management support is needed unless otherwise documented below in the visit note. 

## 2014-11-30 ENCOUNTER — Telehealth: Payer: Self-pay

## 2014-11-30 NOTE — Telephone Encounter (Signed)
PA started via cover my meds  KEY: [id number]

## 2014-11-30 NOTE — Telephone Encounter (Signed)
[id number]  Cover my meds

## 2014-12-10 ENCOUNTER — Telehealth: Payer: Self-pay

## 2014-12-10 NOTE — Telephone Encounter (Signed)
PA approved.   LVM for pt to call back as soon as possible.  RE: regarding same  Faxed information to pharmacy to fill medication.

## 2014-12-10 NOTE — Telephone Encounter (Signed)
PA initiated via covermymeds. [id number]

## 2015-04-18 ENCOUNTER — Telehealth: Payer: Self-pay

## 2015-04-18 MED ORDER — OMEPRAZOLE 20 MG PO CPDR: 20.0000 mg | DELAYED_RELEASE_CAPSULE | Freq: Two times a day (BID) | ORAL | Status: DC

## 2015-04-18 NOTE — Telephone Encounter (Signed)
Refill has been sent to pharmacy.  

## 2015-05-31 ENCOUNTER — Other Ambulatory Visit (INDEPENDENT_AMBULATORY_CARE_PROVIDER_SITE_OTHER): Payer: BC Managed Care – PPO

## 2015-05-31 ENCOUNTER — Ambulatory Visit (INDEPENDENT_AMBULATORY_CARE_PROVIDER_SITE_OTHER): Payer: BC Managed Care – PPO | Admitting: Internal Medicine

## 2015-05-31 ENCOUNTER — Encounter: Payer: Self-pay | Admitting: Internal Medicine

## 2015-05-31 VITALS — BP 120/85 | HR 54 | Ht 71.0 in | Wt 224.0 lb

## 2015-05-31 DIAGNOSIS — R7309 Other abnormal glucose: Secondary | ICD-10-CM

## 2015-05-31 DIAGNOSIS — Z0000 Encounter for general adult medical examination without abnormal findings: Secondary | ICD-10-CM | POA: Diagnosis not present

## 2015-05-31 DIAGNOSIS — Z23 Encounter for immunization: Secondary | ICD-10-CM

## 2015-05-31 DIAGNOSIS — N529 Male erectile dysfunction, unspecified: Secondary | ICD-10-CM | POA: Diagnosis not present

## 2015-05-31 DIAGNOSIS — I10 Essential (primary) hypertension: Secondary | ICD-10-CM

## 2015-05-31 LAB — BASIC METABOLIC PANEL
BUN: 19 mg/dL (ref 6–23)
CALCIUM: 10.1 mg/dL (ref 8.4–10.5)
CO2: 28 mEq/L (ref 19–32)
CREATININE: 1.35 mg/dL (ref 0.40–1.50)
Chloride: 106 mEq/L (ref 96–112)
GFR: 68.13 mL/min (ref >60.00)
Glucose, Bld: 90 mg/dL (ref 70–99)
Potassium: 4.3 mEq/L (ref 3.5–5.1)
Sodium: 139 mEq/L (ref 135–145)

## 2015-05-31 LAB — CBC WITH DIFFERENTIAL/PLATELET
Basophils Absolute: 0 10*3/uL (ref 0.0–0.1)
Basophils Relative: 0.8 % (ref 0.0–3.0)
EOS ABS: 0.4 10*3/uL (ref 0.0–0.7)
Eosinophils Relative: 10.5 % — ABNORMAL HIGH (ref 0.0–5.0)
HCT: 38.1 % — ABNORMAL LOW (ref 39.0–52.0)
HEMOGLOBIN: 12.8 g/dL — AB (ref 13.0–17.0)
Lymphocytes Relative: 34.5 % (ref 12.0–46.0)
Lymphs Abs: 1.3 10*3/uL (ref 0.7–4.0)
MCHC: 33.5 g/dL (ref 30.0–36.0)
MCV: 83.3 fl (ref 78.0–100.0)
MONO ABS: 0.4 10*3/uL (ref 0.1–1.0)
Monocytes Relative: 9.1 % (ref 3.0–12.0)
Neutro Abs: 1.7 10*3/uL (ref 1.4–7.7)
Neutrophils Relative %: 45.1 % (ref 43.0–77.0)
Platelets: 253 10*3/uL (ref 150.0–400.0)
RBC: 4.57 Mil/uL (ref 4.22–5.81)
RDW: 14.1 % (ref 11.5–15.5)
WBC: 3.9 10*3/uL — ABNORMAL LOW (ref 4.0–10.5)

## 2015-05-31 LAB — TSH: TSH: 1.17 u[IU]/mL (ref 0.35–4.50)

## 2015-05-31 LAB — URINALYSIS
BILIRUBIN URINE: NEGATIVE
HGB URINE DIPSTICK: NEGATIVE
KETONES UR: NEGATIVE
LEUKOCYTES UA: NEGATIVE
NITRITE: NEGATIVE
PH: 6 (ref 5.0–8.0)
Specific Gravity, Urine: 1.015 (ref 1.000–1.030)
Total Protein, Urine: NEGATIVE
UROBILINOGEN UA: 0.2 (ref 0.0–1.0)
Urine Glucose: NEGATIVE

## 2015-05-31 LAB — LIPID PANEL
Cholesterol: 129 mg/dL (ref 0–200)
HDL: 46.9 mg/dL (ref >39.00)
LDL CALC: 67 mg/dL (ref 0–99)
NONHDL: 82.32
Total CHOL/HDL Ratio: 3
Triglycerides: 77 mg/dL (ref 0.0–149.0)
VLDL: 15.4 mg/dL (ref 0.0–40.0)

## 2015-05-31 LAB — HEPATIC FUNCTION PANEL
ALT: 37 U/L (ref 0–53)
AST: 48 U/L — AB (ref 0–37)
Albumin: 4.7 g/dL (ref 3.5–5.2)
Alkaline Phosphatase: 69 U/L (ref 39–117)
BILIRUBIN DIRECT: 0.2 mg/dL (ref 0.0–0.3)
BILIRUBIN TOTAL: 0.6 mg/dL (ref 0.2–1.2)
Total Protein: 7.2 g/dL (ref 6.0–8.3)

## 2015-05-31 LAB — PSA: PSA: 0.62 ng/mL (ref 0.10–4.00)

## 2015-05-31 LAB — HEMOGLOBIN A1C: Hgb A1c MFr Bld: 6.3 % (ref 4.6–6.5)

## 2015-05-31 MED ORDER — SILDENAFIL CITRATE 100 MG PO TABS: ORAL_TABLET | ORAL | Status: DC

## 2015-05-31 NOTE — Progress Notes (Signed)
Pre visit review using our clinic review tool, if applicable. No additional management support is needed unless otherwise documented below in the visit note. 

## 2015-05-31 NOTE — Assessment & Plan Note (Signed)
Viagra prn 

## 2015-05-31 NOTE — Assessment & Plan Note (Signed)
Amlodipine, Losartan 

## 2015-05-31 NOTE — Addendum Note (Signed)
Addended by: Cassandria Anger on: 05/31/2015 05:08 PM   Modules accepted: Miquel Dunn

## 2015-05-31 NOTE — Assessment & Plan Note (Signed)
We discussed age appropriate health related issues, including available/recomended screening tests and vaccinations. We discussed a need for adhering to healthy diet and exercise. Labs/EKG were reviewed/ordered. All questions were answered.  EKG Labs 

## 2015-05-31 NOTE — Assessment & Plan Note (Signed)
Labs

## 2015-05-31 NOTE — Progress Notes (Signed)
Subjective:  Patient ID: Robert [name], male    DOB: 17-May-1950  Age: 65 y.o. MRN: [id number]  CC: Annual Exam   HPI Robert [name] presents for a well exam. C/o ED  Outpatient Prescriptions Prior to Visit  Medication Sig Dispense Refill  . ALPRAZolam (XANAX) 0.5 MG tablet Take 1 tablet (0.5 mg total) by mouth 2 (two) times daily as needed for anxiety. 180 tablet 1  . amLODipine (NORVASC) 10 MG tablet Take 1 tablet (10 mg total) by mouth daily. 90 tablet 3  . aspirin 81 MG tablet Take 81 mg by mouth daily.    Marland Kitchen atenolol (TENORMIN) 100 MG tablet Take 1 tablet (100 mg total) by mouth daily. 90 tablet 3  . cholecalciferol (VITAMIN D) 1000 UNITS tablet Take 1 tablet (1,000 Units total) by mouth daily. 100 tablet 3  . gemfibrozil (LOPID) 600 MG tablet Take 1 tablet (600 mg total) by mouth 2 (two) times daily before a meal. 180 tablet 3  . levocetirizine (XYZAL) 5 MG tablet Take 1 tablet (5 mg total) by mouth every evening. 90 tablet 3  . losartan (COZAAR) 100 MG tablet Take 1 tablet (100 mg total) by mouth daily. 90 tablet 3  . Omega-3 Fatty Acids (FISH OIL) 1000 MG CPDR Take by mouth daily.    Marland Kitchen omeprazole (PRILOSEC) 20 MG capsule Take 1 capsule (20 mg total) by mouth 2 (two) times daily before a meal. 180 capsule 3  . pramoxine-hydrocortisone (ANALPRAM HC) cream Apply as directed 30 g 2  . sildenafil (VIAGRA) 100 MG tablet Take as directed 10 tablet 5   No facility-administered medications prior to visit.    ROS Review of Systems  Constitutional: Negative for appetite change, fatigue and unexpected weight change.  HENT: Negative for congestion, nosebleeds, sneezing, sore throat and trouble swallowing.   Eyes: Negative for itching and visual disturbance.  Respiratory: Negative for cough.   Cardiovascular: Negative for chest pain, palpitations and leg swelling.  Gastrointestinal: Negative for nausea, diarrhea, blood in stool and abdominal distention.  Genitourinary: Negative for  frequency and hematuria.  Musculoskeletal: Negative for back pain, joint swelling, gait problem and neck pain.  Skin: Negative for rash.  Neurological: Negative for dizziness, tremors, speech difficulty and weakness.  Psychiatric/Behavioral: Negative for suicidal ideas, sleep disturbance, dysphoric mood and agitation. The patient is not nervous/anxious.     Objective:  BP 148/82 mmHg  Pulse 54  Ht 5\' 11"  (1.803 m)  Wt 224 lb (101.606 kg)  BMI 31.26 kg/m2  SpO2 96%  BP Readings from Last 3 Encounters:  05/31/15 148/82  11/10/14 138/86  05/19/14 128/88    Wt Readings from Last 3 Encounters:  05/31/15 224 lb (101.606 kg)  11/10/14 226 lb (102.513 kg)  05/19/14 227 lb (102.967 kg)    Physical Exam  Constitutional: He is oriented to person, place, and time. He appears well-developed and well-nourished. No distress.  HENT:  Head: Normocephalic and atraumatic.  Right Ear: External ear normal.  Left Ear: External ear normal.  Nose: Nose normal.  Mouth/Throat: Oropharynx is clear and moist. No oropharyngeal exudate.  Eyes: Conjunctivae and EOM are normal. Pupils are equal, round, and reactive to light. Right eye exhibits no discharge. Left eye exhibits no discharge. No scleral icterus.  Neck: Normal range of motion. Neck supple. No JVD present. No tracheal deviation present. No thyromegaly present.  Cardiovascular: Normal rate, regular rhythm, normal heart sounds and intact distal pulses.  Exam reveals no gallop and no friction rub.  No murmur heard. Pulmonary/Chest: Effort normal and breath sounds normal. No stridor. No respiratory distress. He has no wheezes. He has no rales. He exhibits no tenderness.  Abdominal: Soft. Bowel sounds are normal. He exhibits no distension and no mass. There is no tenderness. There is no rebound and no guarding.  Genitourinary: Rectum normal, prostate normal and penis normal. Guaiac negative stool. No penile tenderness.  Musculoskeletal: Normal range  of motion. He exhibits no edema or tenderness.  Lymphadenopathy:    He has no cervical adenopathy.  Neurological: He is alert and oriented to person, place, and time. He has normal reflexes. No cranial nerve deficit. He exhibits normal muscle tone. Coordination normal.  Skin: Skin is warm and dry. No rash noted. He is not diaphoretic. No erythema. No pallor.  Psychiatric: He has a normal mood and affect. His behavior is normal. Judgment and thought content normal.   Procedure: EKG Indication: well; bradycardia Impression: S brady. No acute changes.  Lab Results  Component Value Date   WBC 3.8* 05/20/2014   HGB 14.1 05/20/2014   HCT 41.6 05/20/2014   PLT 251.0 05/20/2014   GLUCOSE 102* 11/10/2014   CHOL 146 05/20/2014   TRIG 128.0 05/20/2014   HDL 45.00 05/20/2014   LDLCALC 75 05/20/2014   ALT 27 05/20/2014   AST 41* 05/20/2014   NA 137 11/10/2014   K 4.2 11/10/2014   CL 103 11/10/2014   CREATININE 1.21 11/10/2014   BUN 12 11/10/2014   CO2 26 11/10/2014   TSH 1.86 05/20/2014   PSA 0.61 05/20/2014   HGBA1C 6.1 11/10/2014    Dg Chest 2 View  03/07/2011  *RADIOLOGY REPORT* Clinical Data: Physical examination.  Nonsmoker without acute complaints. CHEST - 2 VIEW Comparison: 01/10/2010 and 10/04/2008. Findings: The heart size and mediastinal contours are normal. The lungs are clear. There is no pleural effusion or pneumothorax. No acute osseous findings are identified. IMPRESSION: Stable examination.  No active cardiopulmonary process. Original Report Authenticated By: Vivia Ewing, M.D.   Assessment & Plan:   Robert [name] was seen today for annual exam.  Diagnoses and all orders for this visit:  Well adult exam -     EKG 12-Lead -     Basic metabolic panel; Future -     CBC with Differential/Platelet; Future -     Hepatic function panel; Future -     Hemoglobin A1c; Future -     Hepatitis C antibody; Future -     Lipid panel; Future -     PSA; Future -     TSH; Future -      Urinalysis; Future  Essential hypertension  Elevated glucose -     Hemoglobin A1c; Future  Need for prophylactic vaccination against Streptococcus pneumoniae (pneumococcus) -     Pneumococcal polysaccharide vaccine 23-valent greater than or equal to 2yo subcutaneous/IM  Other orders -     sildenafil (VIAGRA) 100 MG tablet; Take as directed  I am having Robert [name] maintain his aspirin, Fish Oil, amLODipine, atenolol, ALPRAZolam, gemfibrozil, levocetirizine, pramoxine-hydrocortisone, losartan, cholecalciferol, omeprazole, and sildenafil.  Meds ordered this encounter  Medications  . sildenafil (VIAGRA) 100 MG tablet    Sig: Take as directed    Dispense:  30 tablet    Refill:  5     Follow-up: Return in about 1 year (around 05/30/2016) for Wellness Exam.  Walker Kehr, MD

## 2015-06-01 LAB — HEPATITIS C ANTIBODY: HCV Ab: NEGATIVE

## 2015-06-07 ENCOUNTER — Encounter: Payer: BC Managed Care – PPO | Admitting: Internal Medicine

## 2015-06-10 ENCOUNTER — Other Ambulatory Visit: Payer: Self-pay | Admitting: Internal Medicine

## 2015-07-16 ENCOUNTER — Other Ambulatory Visit: Payer: Self-pay | Admitting: Internal Medicine

## 2015-09-09 ENCOUNTER — Ambulatory Visit (INDEPENDENT_AMBULATORY_CARE_PROVIDER_SITE_OTHER): Payer: Medicare Other | Admitting: Adult Health

## 2015-09-09 ENCOUNTER — Encounter: Payer: Self-pay | Admitting: Adult Health

## 2015-09-09 VITALS — BP 162/82 | HR 58 | Temp 97.8°F | Wt 226.4 lb

## 2015-09-09 DIAGNOSIS — J0110 Acute frontal sinusitis, unspecified: Secondary | ICD-10-CM | POA: Diagnosis not present

## 2015-09-09 MED ORDER — DOXYCYCLINE HYCLATE 100 MG PO CAPS: 100.0000 mg | ORAL_CAPSULE | Freq: Two times a day (BID) | ORAL | 0 refills | Status: DC

## 2015-09-09 NOTE — Progress Notes (Signed)
Subjective:    Patient ID: Robert [name], male    DOB: 06-27-1950, 65 y.o.   MRN: [id number]  Sinus Problem  This is a new problem. The current episode started in the past 7 days. The problem has been resolved since onset. There has been no fever. Associated symptoms include congestion, coughing, sneezing, a sore throat and swollen glands. Pertinent negatives include no chills, ear pain, headaches, shortness of breath or sinus pressure. Past treatments include oral decongestants. The treatment provided mild relief.      Review of Systems  Constitutional: Negative for chills.  HENT: Positive for congestion, postnasal drip, rhinorrhea, sneezing and sore throat. Negative for ear pain, sinus pressure, trouble swallowing and voice change.   Respiratory: Positive for cough. Negative for apnea, choking, chest tightness, shortness of breath, wheezing and stridor.   Cardiovascular: Negative.   Neurological: Negative for headaches.   Past Medical History:  Diagnosis Date  . Anxiety   . ED (erectile dysfunction)   . History of gastritis   . History of UTI   . Hx of colonic polyps   . Hyperlipidemia   . Hypertension   . IBS (irritable bowel syndrome)   . Myalgia   . Venous insufficiency     Social History   Social History  . Marital status: Married    Spouse name: N/A  . Number of children: N/A  . Years of education: N/A   Occupational History  . Building control surveyor    Social History Main Topics  . Smoking status: Never Smoker  . Smokeless tobacco: Never Used  . Alcohol use No     Comment: social use  . Drug use: No  . Sexual activity: Yes   Other Topics Concern  . Not on file   Social History Narrative  . No narrative on file    Past Surgical History:  Procedure Laterality Date  . APPENDECTOMY    . COLON SURGERY  07/2009   Dr. Osborne Casco tubulovillous adenoma  . VASECTOMY      Family History  Problem Relation Age of Onset  . Colon cancer Cousin      Allergies  Allergen Reactions  . Atorvastatin     REACTION: INTOL to Stains w/ myalgias    Current Outpatient Prescriptions on File Prior to Visit  Medication Sig Dispense Refill  . ALPRAZolam (XANAX) 0.5 MG tablet Take 1 tablet (0.5 mg total) by mouth 2 (two) times daily as needed for anxiety. 180 tablet 1  . amLODipine (NORVASC) 10 MG tablet Take 1 tablet (10 mg total) by mouth daily. 90 tablet 3  . aspirin 81 MG tablet Take 81 mg by mouth daily.    Marland Kitchen atenolol (TENORMIN) 100 MG tablet Take 1 tablet (100 mg total) by mouth daily. 90 tablet 3  . cholecalciferol (VITAMIN D) 1000 UNITS tablet Take 1 tablet (1,000 Units total) by mouth daily. 100 tablet 3  . gemfibrozil (LOPID) 600 MG tablet Take 1 tablet (600 mg total) by mouth 2 (two) times daily before a meal. 180 tablet 3  . gemfibrozil (LOPID) 600 MG tablet TAKE 1 TABLET BY MOUTH TWICE DAILY BEFORE A MEAL 180 tablet 3  . levocetirizine (XYZAL) 5 MG tablet Take 1 tablet (5 mg total) by mouth every evening. 90 tablet 3  . losartan (COZAAR) 100 MG tablet Take 1 tablet (100 mg total) by mouth daily. 90 tablet 3  . Omega-3 Fatty Acids (FISH OIL) 1000 MG CPDR Take by mouth daily.    Marland Kitchen  omeprazole (PRILOSEC) 20 MG capsule Take 1 capsule (20 mg total) by mouth 2 (two) times daily before a meal. 180 capsule 3  . pramoxine-hydrocortisone (ANALPRAM HC) cream Apply as directed 30 g 2  . sildenafil (VIAGRA) 100 MG tablet Take as directed 30 tablet 5   No current facility-administered medications on file prior to visit.     BP (!) 162/82 (BP Location: Left Arm, Patient Position: Sitting, Cuff Size: Normal)   Pulse (!) 58   Temp 97.8 F (36.6 C) (Oral)   Wt 226 lb 6.4 oz (102.7 kg)   SpO2 97%   BMI 31.58 kg/m       Objective:   Physical Exam  Constitutional: He is oriented to person, place, and time. He appears well-developed and well-nourished. No distress.  HENT:  Head: Normocephalic and atraumatic.  Right Ear: Hearing, tympanic  membrane, external ear and ear canal normal.  Left Ear: Hearing, tympanic membrane, external ear and ear canal normal.  Nose: Mucosal edema and rhinorrhea present. Right sinus exhibits frontal sinus tenderness. Right sinus exhibits no maxillary sinus tenderness. Left sinus exhibits frontal sinus tenderness. Left sinus exhibits no maxillary sinus tenderness.  Mouth/Throat: Oropharynx is clear and moist and mucous membranes are normal. Mucous membranes are not pale and not dry. Abnormal dentition. No oropharyngeal exudate, posterior oropharyngeal edema or posterior oropharyngeal erythema.  Eyes: Conjunctivae and EOM are normal. Pupils are equal, round, and reactive to light. Right eye exhibits no discharge. Left eye exhibits no discharge. No scleral icterus.  Neck: Trachea normal and normal range of motion. Neck supple.  Cardiovascular: Normal rate, regular rhythm, normal heart sounds and intact distal pulses.  Exam reveals no gallop and no friction rub.   No murmur heard. Pulmonary/Chest: Effort normal and breath sounds normal. No respiratory distress. He has no wheezes. He has no rales. He exhibits no tenderness.  Musculoskeletal: Normal range of motion. He exhibits no edema, tenderness or deformity.  Lymphadenopathy:       Head (right side): Submandibular adenopathy present.       Head (left side): Submandibular adenopathy present.    He has cervical adenopathy.  Neurological: He is alert and oriented to person, place, and time. He has normal reflexes. He displays normal reflexes. No cranial nerve deficit. He exhibits normal muscle tone. Coordination normal.  Skin: Skin is warm and dry. No rash noted. He is not diaphoretic. No erythema. No pallor.  Psychiatric: He has a normal mood and affect. His behavior is normal. Judgment and thought content normal.  Nursing note and vitals reviewed.     Assessment & Plan:  1. Acute frontal sinusitis, recurrence not specified - doxycycline (VIBRAMYCIN) 100  MG capsule; Take 1 capsule (100 mg total) by mouth 2 (two) times daily.  Dispense: 14 capsule; Refill: 0 - Flonase - Continue with Mucinex - Stay hydrated and rest - Follow up as needed  Dorothyann Peng, NP

## 2015-09-09 NOTE — Progress Notes (Signed)
Pre visit review using our clinic review tool, if applicable. No additional management support is needed unless otherwise documented below in the visit note. 

## 2015-09-13 ENCOUNTER — Encounter: Payer: Self-pay | Admitting: Internal Medicine

## 2015-09-13 ENCOUNTER — Ambulatory Visit (INDEPENDENT_AMBULATORY_CARE_PROVIDER_SITE_OTHER): Payer: Medicare Other | Admitting: Internal Medicine

## 2015-09-13 DIAGNOSIS — J040 Acute laryngitis: Secondary | ICD-10-CM

## 2015-09-13 NOTE — Assessment & Plan Note (Signed)
On Dpoxy Voice rest CXR if worse

## 2015-09-13 NOTE — Progress Notes (Signed)
Subjective:  Patient ID: Robert [name], male    DOB: Jul 18, 1950  Age: 65 y.o. MRN: [id number]  CC: Nasal Congestion (STARTED DOXYCYCLINE ON FRIDAY BID 7 DAY COURSE. )   HPI Robert [name] presents for hoarseness. The pt started Doxy last Friday. May be better a little.  Outpatient Medications Prior to Visit  Medication Sig Dispense Refill  . ALPRAZolam (XANAX) 0.5 MG tablet Take 1 tablet (0.5 mg total) by mouth 2 (two) times daily as needed for anxiety. 180 tablet 1  . amLODipine (NORVASC) 10 MG tablet Take 1 tablet (10 mg total) by mouth daily. 90 tablet 3  . aspirin 81 MG tablet Take 81 mg by mouth daily.    Marland Kitchen atenolol (TENORMIN) 100 MG tablet Take 1 tablet (100 mg total) by mouth daily. 90 tablet 3  . cholecalciferol (VITAMIN D) 1000 UNITS tablet Take 1 tablet (1,000 Units total) by mouth daily. 100 tablet 3  . doxycycline (VIBRAMYCIN) 100 MG capsule Take 1 capsule (100 mg total) by mouth 2 (two) times daily. 14 capsule 0  . gemfibrozil (LOPID) 600 MG tablet Take 1 tablet (600 mg total) by mouth 2 (two) times daily before a meal. 180 tablet 3  . gemfibrozil (LOPID) 600 MG tablet TAKE 1 TABLET BY MOUTH TWICE DAILY BEFORE A MEAL 180 tablet 3  . levocetirizine (XYZAL) 5 MG tablet Take 1 tablet (5 mg total) by mouth every evening. 90 tablet 3  . losartan (COZAAR) 100 MG tablet Take 1 tablet (100 mg total) by mouth daily. 90 tablet 3  . Omega-3 Fatty Acids (FISH OIL) 1000 MG CPDR Take by mouth daily.    Marland Kitchen omeprazole (PRILOSEC) 20 MG capsule Take 1 capsule (20 mg total) by mouth 2 (two) times daily before a meal. 180 capsule 3  . pramoxine-hydrocortisone (ANALPRAM HC) cream Apply as directed 30 g 2  . sildenafil (VIAGRA) 100 MG tablet Take as directed 30 tablet 5   No facility-administered medications prior to visit.     ROS Review of Systems  Constitutional: Negative for appetite change, fatigue and unexpected weight change.  HENT: Positive for sore throat and voice change. Negative for  congestion, nosebleeds, sneezing and trouble swallowing.   Eyes: Negative for itching and visual disturbance.  Respiratory: Negative for cough.   Cardiovascular: Negative for chest pain, palpitations and leg swelling.  Gastrointestinal: Negative for abdominal distention, blood in stool, diarrhea and nausea.  Genitourinary: Negative for frequency and hematuria.  Musculoskeletal: Negative for back pain, gait problem, joint swelling and neck pain.  Skin: Negative for rash.  Neurological: Negative for dizziness, tremors, speech difficulty and weakness.  Psychiatric/Behavioral: Negative for agitation, dysphoric mood and sleep disturbance. The patient is not nervous/anxious.     Objective:  BP 122/80   Pulse 75   Wt 228 lb (103.4 kg)   SpO2 98%   BMI 31.80 kg/m   BP Readings from Last 3 Encounters:  09/13/15 122/80  09/09/15 (!) 162/82  05/31/15 120/85    Wt Readings from Last 3 Encounters:  09/13/15 228 lb (103.4 kg)  09/09/15 226 lb 6.4 oz (102.7 kg)  05/31/15 224 lb (101.6 kg)    Physical Exam  Constitutional: He is oriented to person, place, and time. He appears well-developed. No distress.  NAD  HENT:  Mouth/Throat: Oropharynx is clear and moist. No oropharyngeal exudate.  Eyes: Conjunctivae are normal. Pupils are equal, round, and reactive to light.  Neck: Normal range of motion. No JVD present. No thyromegaly present.  Cardiovascular:  Normal rate, regular rhythm, normal heart sounds and intact distal pulses.  Exam reveals no gallop and no friction rub.   No murmur heard. Pulmonary/Chest: Effort normal and breath sounds normal. No respiratory distress. He has no wheezes. He has no rales. He exhibits no tenderness.  Abdominal: Soft. Bowel sounds are normal. He exhibits no distension and no mass. There is no tenderness. There is no rebound and no guarding.  Musculoskeletal: Normal range of motion. He exhibits no edema or tenderness.  Lymphadenopathy:    He has no cervical  adenopathy.  Neurological: He is alert and oriented to person, place, and time. He has normal reflexes. No cranial nerve deficit. He exhibits normal muscle tone. He displays a negative Romberg sign. Coordination and gait normal.  Skin: Skin is warm and dry. No rash noted.  Psychiatric: He has a normal mood and affect. His behavior is normal. Judgment and thought content normal.  hoarse  Lab Results  Component Value Date   WBC 3.9 (L) 05/31/2015   HGB 12.8 (L) 05/31/2015   HCT 38.1 (L) 05/31/2015   PLT 253.0 05/31/2015   GLUCOSE 90 05/31/2015   CHOL 129 05/31/2015   TRIG 77.0 05/31/2015   HDL 46.90 05/31/2015   LDLCALC 67 05/31/2015   ALT 37 05/31/2015   AST 48 (H) 05/31/2015   NA 139 05/31/2015   K 4.3 05/31/2015   CL 106 05/31/2015   CREATININE 1.35 05/31/2015   BUN 19 05/31/2015   CO2 28 05/31/2015   TSH 1.17 05/31/2015   PSA 0.62 05/31/2015   HGBA1C 6.3 05/31/2015    Dg Chest 2 View  Result Date: 03/07/2011 *RADIOLOGY REPORT* Clinical Data: Physical examination.  Nonsmoker without acute complaints. CHEST - 2 VIEW Comparison: 01/10/2010 and 10/04/2008. Findings: The heart size and mediastinal contours are normal. The lungs are clear. There is no pleural effusion or pneumothorax. No acute osseous findings are identified. IMPRESSION: Stable examination.  No active cardiopulmonary process. Original Report Authenticated By: Vivia Ewing, M.D.   Assessment & Plan:   There are no diagnoses linked to this encounter. I am having Mr. Begnaud maintain his aspirin, Fish Oil, amLODipine, atenolol, ALPRAZolam, gemfibrozil, levocetirizine, pramoxine-hydrocortisone, losartan, cholecalciferol, omeprazole, sildenafil, gemfibrozil, and doxycycline.  No orders of the defined types were placed in this encounter.    Follow-up: No Follow-up on file.  Walker Kehr, MD

## 2015-09-26 ENCOUNTER — Telehealth: Payer: Self-pay

## 2015-09-26 NOTE — Telephone Encounter (Signed)
atenolol (TENORMIN) 100 MG tablet   Patient is requesting a refill on this medication. But states the pharmacy said they no longer make it. Please follow up with patient. Thank you.

## 2015-09-27 MED ORDER — METOPROLOL SUCCINATE ER 100 MG PO TB24: 100.0000 mg | ORAL_TABLET | Freq: Every day | ORAL | 3 refills | Status: DC

## 2015-09-27 NOTE — Telephone Encounter (Signed)
OK Toprol XL Thx

## 2015-09-27 NOTE — Telephone Encounter (Signed)
Patient is calling again about this. He is saying he really needs his medication. Can you please follow up with him. Thank you.

## 2015-09-28 NOTE — Telephone Encounter (Signed)
Notified pt of med change../lmb 

## 2015-10-10 ENCOUNTER — Other Ambulatory Visit: Payer: Self-pay | Admitting: Internal Medicine

## 2015-11-17 ENCOUNTER — Other Ambulatory Visit: Payer: Self-pay | Admitting: Internal Medicine

## 2015-11-30 ENCOUNTER — Other Ambulatory Visit: Payer: Self-pay | Admitting: Internal Medicine

## 2016-01-12 ENCOUNTER — Other Ambulatory Visit: Payer: Self-pay | Admitting: Internal Medicine

## 2016-03-12 ENCOUNTER — Encounter: Payer: Self-pay | Admitting: Internal Medicine

## 2016-03-12 ENCOUNTER — Other Ambulatory Visit (INDEPENDENT_AMBULATORY_CARE_PROVIDER_SITE_OTHER): Payer: Medicare Other

## 2016-03-12 ENCOUNTER — Ambulatory Visit (INDEPENDENT_AMBULATORY_CARE_PROVIDER_SITE_OTHER): Payer: Medicare Other | Admitting: Internal Medicine

## 2016-03-12 VITALS — BP 128/78 | HR 64 | Temp 97.9°F | Resp 16 | Ht 69.75 in | Wt 227.2 lb

## 2016-03-12 DIAGNOSIS — R7309 Other abnormal glucose: Secondary | ICD-10-CM

## 2016-03-12 DIAGNOSIS — R748 Abnormal levels of other serum enzymes: Secondary | ICD-10-CM

## 2016-03-12 DIAGNOSIS — I10 Essential (primary) hypertension: Secondary | ICD-10-CM | POA: Diagnosis not present

## 2016-03-12 DIAGNOSIS — Z0000 Encounter for general adult medical examination without abnormal findings: Secondary | ICD-10-CM | POA: Diagnosis not present

## 2016-03-12 LAB — LIPID PANEL
CHOLESTEROL: 153 mg/dL (ref 0–200)
HDL: 43 mg/dL (ref >39.00)
LDL CALC: 85 mg/dL (ref 0–99)
NonHDL: 109.69
TRIGLYCERIDES: 121 mg/dL (ref 0.0–149.0)
Total CHOL/HDL Ratio: 4
VLDL: 24.2 mg/dL (ref 0.0–40.0)

## 2016-03-12 LAB — CBC WITH DIFFERENTIAL/PLATELET
BASOS ABS: 0 10*3/uL (ref 0.0–0.1)
Basophils Relative: 0.9 % (ref 0.0–3.0)
EOS PCT: 9.7 % — AB (ref 0.0–5.0)
Eosinophils Absolute: 0.4 10*3/uL (ref 0.0–0.7)
HCT: 39.4 % (ref 39.0–52.0)
HEMOGLOBIN: 13.6 g/dL (ref 13.0–17.0)
LYMPHS ABS: 1.3 10*3/uL (ref 0.7–4.0)
LYMPHS PCT: 35 % (ref 12.0–46.0)
MCHC: 34.5 g/dL (ref 30.0–36.0)
MCV: 81.8 fl (ref 78.0–100.0)
MONOS PCT: 9.2 % (ref 3.0–12.0)
Monocytes Absolute: 0.4 10*3/uL (ref 0.1–1.0)
NEUTROS PCT: 45.2 % (ref 43.0–77.0)
Neutro Abs: 1.7 10*3/uL (ref 1.4–7.7)
Platelets: 279 10*3/uL (ref 150.0–400.0)
RBC: 4.82 Mil/uL (ref 4.22–5.81)
RDW: 13.7 % (ref 11.5–15.5)
WBC: 3.8 10*3/uL — AB (ref 4.0–10.5)

## 2016-03-12 LAB — HEMOGLOBIN A1C: Hgb A1c MFr Bld: 6.7 % — ABNORMAL HIGH (ref 4.6–6.5)

## 2016-03-12 LAB — BASIC METABOLIC PANEL
BUN: 20 mg/dL (ref 6–23)
CO2: 28 mEq/L (ref 19–32)
Calcium: 9.9 mg/dL (ref 8.4–10.5)
Chloride: 104 mEq/L (ref 96–112)
Creatinine, Ser: 1.33 mg/dL (ref 0.40–1.50)
GFR: 69.15 mL/min (ref >60.00)
Glucose, Bld: 118 mg/dL — ABNORMAL HIGH (ref 70–99)
POTASSIUM: 4.1 meq/L (ref 3.5–5.1)
SODIUM: 139 meq/L (ref 135–145)

## 2016-03-12 LAB — HEPATIC FUNCTION PANEL
ALBUMIN: 4.5 g/dL (ref 3.5–5.2)
ALT: 23 U/L (ref 0–53)
AST: 28 U/L (ref 0–37)
Alkaline Phosphatase: 63 U/L (ref 39–117)
Bilirubin, Direct: 0.1 mg/dL (ref 0.0–0.3)
Total Bilirubin: 0.6 mg/dL (ref 0.2–1.2)
Total Protein: 7.3 g/dL (ref 6.0–8.3)

## 2016-03-12 LAB — URIC ACID: URIC ACID, SERUM: 4.8 mg/dL (ref 4.0–7.8)

## 2016-03-12 LAB — CK: Total CK: 428 U/L — ABNORMAL HIGH (ref 7–232)

## 2016-03-12 MED ORDER — GEMFIBROZIL 600 MG PO TABS: ORAL_TABLET | ORAL | 3 refills | Status: DC

## 2016-03-12 MED ORDER — VITAMIN D3 50 MCG (2000 UT) PO CAPS: 2000.0000 [IU] | ORAL_CAPSULE | Freq: Every day | ORAL | 3 refills | Status: DC

## 2016-03-12 MED ORDER — OMEPRAZOLE 20 MG PO CPDR: 20.0000 mg | DELAYED_RELEASE_CAPSULE | Freq: Two times a day (BID) | ORAL | 3 refills | Status: DC

## 2016-03-12 MED ORDER — SILDENAFIL CITRATE 100 MG PO TABS: ORAL_TABLET | ORAL | 2 refills | Status: DC

## 2016-03-12 MED ORDER — AMLODIPINE BESYLATE 10 MG PO TABS: 10.0000 mg | ORAL_TABLET | Freq: Every day | ORAL | 2 refills | Status: DC

## 2016-03-12 MED ORDER — METOPROLOL SUCCINATE ER 100 MG PO TB24: 100.0000 mg | ORAL_TABLET | Freq: Every day | ORAL | 3 refills | Status: DC

## 2016-03-12 MED ORDER — LOSARTAN POTASSIUM 100 MG PO TABS: 100.0000 mg | ORAL_TABLET | Freq: Every day | ORAL | 3 refills | Status: DC

## 2016-03-12 NOTE — Assessment & Plan Note (Signed)
Labs

## 2016-03-12 NOTE — Progress Notes (Signed)
Pre-visit discussion using our clinic review tool. No additional management support is needed unless otherwise documented below in the visit note.  

## 2016-03-12 NOTE — Assessment & Plan Note (Signed)
CK

## 2016-03-12 NOTE — Assessment & Plan Note (Signed)
We discussed age appropriate health related issues, including available/recomended screening tests and vaccinations. We discussed a need for adhering to healthy diet and exercise. Labs/EKG were reviewed/ordered. All questions were answered.   

## 2016-03-12 NOTE — Assessment & Plan Note (Signed)
Losartan, amlodipine

## 2016-03-12 NOTE — Progress Notes (Signed)
Subjective:  Patient ID: Robert [name], male    DOB: 1950/11/30  Age: 66 y.o. MRN: [id number]  CC: Annual Exam   HPI Robert [name] presents for a well exam  Outpatient Medications Prior to Visit  Medication Sig Dispense Refill  . ALPRAZolam (XANAX) 0.5 MG tablet Take 1 tablet (0.5 mg total) by mouth 2 (two) times daily as needed for anxiety. 180 tablet 1  . amLODipine (NORVASC) 10 MG tablet TAKE 1 TABLET BY MOUTH DAILY 90 tablet 2  . aspirin 81 MG tablet Take 81 mg by mouth daily.    Marland Kitchen gemfibrozil (LOPID) 600 MG tablet TAKE 1 TABLET BY MOUTH TWICE DAILY BEFORE A MEAL 180 tablet 3  . levocetirizine (XYZAL) 5 MG tablet Take 1 tablet (5 mg total) by mouth every evening. 90 tablet 3  . losartan (COZAAR) 100 MG tablet TAKE 1 TABLET BY MOUTH DAILY 90 tablet 3  . metoprolol succinate (TOPROL-XL) 100 MG 24 hr tablet Take 1 tablet (100 mg total) by mouth daily. Take with or immediately following a meal. 90 tablet 3  . Omega-3 Fatty Acids (FISH OIL) 1000 MG CPDR Take by mouth daily.    Marland Kitchen omeprazole (PRILOSEC) 20 MG capsule Take 1 capsule (20 mg total) by mouth 2 (two) times daily before a meal. 180 capsule 3  . pramoxine-hydrocortisone (ANALPRAM HC) cream Apply as directed 30 g 2  . sildenafil (VIAGRA) 100 MG tablet Take as directed 30 tablet 5  . doxycycline (VIBRAMYCIN) 100 MG capsule Take 1 capsule (100 mg total) by mouth 2 (two) times daily. 14 capsule 0  . gemfibrozil (LOPID) 600 MG tablet Take 1 tablet (600 mg total) by mouth 2 (two) times daily before a meal. 180 tablet 3  . VIAGRA 100 MG tablet TAKE AS DIRECTED 10 tablet 0   No facility-administered medications prior to visit.     ROS Review of Systems  Constitutional: Negative for appetite change, fatigue and unexpected weight change.  HENT: Negative for congestion, nosebleeds, sneezing, sore throat and trouble swallowing.   Eyes: Negative for itching and visual disturbance.  Respiratory: Negative for cough.   Cardiovascular:  Negative for chest pain, palpitations and leg swelling.  Gastrointestinal: Negative for abdominal distention, blood in stool, diarrhea and nausea.  Genitourinary: Negative for frequency and hematuria.  Musculoskeletal: Negative for back pain, gait problem, joint swelling and neck pain.  Skin: Negative for rash.  Neurological: Negative for dizziness, tremors, speech difficulty and weakness.  Psychiatric/Behavioral: Negative for agitation, dysphoric mood, sleep disturbance and suicidal ideas. The patient is not nervous/anxious.     Objective:  BP 128/78   Pulse 64   Temp 97.9 F (36.6 C) (Oral)   Resp 16   Ht 5' 9.75" (1.772 m)   Wt 227 lb 4 oz (103.1 kg)   SpO2 95%   BMI 32.84 kg/m   BP Readings from Last 3 Encounters:  03/12/16 128/78  09/13/15 122/80  09/09/15 (!) 162/82    Wt Readings from Last 3 Encounters:  03/12/16 227 lb 4 oz (103.1 kg)  09/13/15 228 lb (103.4 kg)  09/09/15 226 lb 6.4 oz (102.7 kg)    Physical Exam  Constitutional: He is oriented to person, place, and time. He appears well-developed. No distress.  NAD  HENT:  Mouth/Throat: Oropharynx is clear and moist.  Eyes: Conjunctivae are normal. Pupils are equal, round, and reactive to light.  Neck: Normal range of motion. No JVD present. No thyromegaly present.  Cardiovascular: Normal rate, regular rhythm, normal  heart sounds and intact distal pulses.  Exam reveals no gallop and no friction rub.   No murmur heard. Pulmonary/Chest: Effort normal and breath sounds normal. No respiratory distress. He has no wheezes. He has no rales. He exhibits no tenderness.  Abdominal: Soft. Bowel sounds are normal. He exhibits no distension and no mass. There is no tenderness. There is no rebound and no guarding.  Genitourinary: Rectum normal and prostate normal. Rectal exam shows guaiac negative stool.  Musculoskeletal: Normal range of motion. He exhibits no edema or tenderness.  Lymphadenopathy:    He has no cervical  adenopathy.  Neurological: He is alert and oriented to person, place, and time. He has normal reflexes. No cranial nerve deficit. He exhibits normal muscle tone. He displays a negative Romberg sign. Coordination and gait normal.  Skin: Skin is warm and dry. No rash noted.  Psychiatric: He has a normal mood and affect. His behavior is normal. Judgment and thought content normal.    Lab Results  Component Value Date   WBC 3.9 (L) 05/31/2015   HGB 12.8 (L) 05/31/2015   HCT 38.1 (L) 05/31/2015   PLT 253.0 05/31/2015   GLUCOSE 90 05/31/2015   CHOL 129 05/31/2015   TRIG 77.0 05/31/2015   HDL 46.90 05/31/2015   LDLCALC 67 05/31/2015   ALT 37 05/31/2015   AST 48 (H) 05/31/2015   NA 139 05/31/2015   K 4.3 05/31/2015   CL 106 05/31/2015   CREATININE 1.35 05/31/2015   BUN 19 05/31/2015   CO2 28 05/31/2015   TSH 1.17 05/31/2015   PSA 0.62 05/31/2015   HGBA1C 6.3 05/31/2015    Dg Chest 2 View  Result Date: 03/07/2011 *RADIOLOGY REPORT* Clinical Data: Physical examination.  Nonsmoker without acute complaints. CHEST - 2 VIEW Comparison: 01/10/2010 and 10/04/2008. Findings: The heart size and mediastinal contours are normal. The lungs are clear. There is no pleural effusion or pneumothorax. No acute osseous findings are identified. IMPRESSION: Stable examination.  No active cardiopulmonary process. Original Report Authenticated By: Vivia Ewing, M.D.   Assessment & Plan:   There are no diagnoses linked to this encounter. I have discontinued Robert [name]'s doxycycline and VIAGRA. I am also having him maintain his aspirin, Fish Oil, ALPRAZolam, levocetirizine, pramoxine-hydrocortisone, omeprazole, sildenafil, gemfibrozil, metoprolol succinate, amLODipine, and losartan.  No orders of the defined types were placed in this encounter.    Follow-up: No Follow-up on file.  Walker Kehr, MD

## 2016-03-13 LAB — TSH: TSH: 1.85 u[IU]/mL (ref 0.35–4.50)

## 2016-03-13 LAB — PSA: PSA: 5.3 ng/mL — ABNORMAL HIGH (ref 0.10–4.00)

## 2016-03-17 ENCOUNTER — Other Ambulatory Visit: Payer: Self-pay | Admitting: Internal Medicine

## 2016-03-19 ENCOUNTER — Encounter: Payer: Self-pay | Admitting: Internal Medicine

## 2016-03-20 ENCOUNTER — Other Ambulatory Visit: Payer: Self-pay | Admitting: Internal Medicine

## 2016-03-20 DIAGNOSIS — R972 Elevated prostate specific antigen [PSA]: Secondary | ICD-10-CM

## 2016-03-22 ENCOUNTER — Emergency Department (HOSPITAL_COMMUNITY)
Admission: EM | Admit: 2016-03-22 | Discharge: 2016-03-22 | Disposition: A | Discharge status: Home / Self Care | Payer: Medicare Other | Attending: Emergency Medicine | Admitting: Emergency Medicine

## 2016-03-22 ENCOUNTER — Encounter (HOSPITAL_COMMUNITY): Payer: Self-pay | Admitting: *Deleted

## 2016-03-22 DIAGNOSIS — Y9389 Activity, other specified: Secondary | ICD-10-CM | POA: Insufficient documentation

## 2016-03-22 DIAGNOSIS — Y999 Unspecified external cause status: Secondary | ICD-10-CM | POA: Insufficient documentation

## 2016-03-22 DIAGNOSIS — Z7982 Long term (current) use of aspirin: Secondary | ICD-10-CM | POA: Insufficient documentation

## 2016-03-22 DIAGNOSIS — S299XXA Unspecified injury of thorax, initial encounter: Secondary | ICD-10-CM | POA: Diagnosis present

## 2016-03-22 DIAGNOSIS — X501XXA Overexertion from prolonged static or awkward postures, initial encounter: Secondary | ICD-10-CM | POA: Diagnosis not present

## 2016-03-22 DIAGNOSIS — S29012A Strain of muscle and tendon of back wall of thorax, initial encounter: Secondary | ICD-10-CM | POA: Insufficient documentation

## 2016-03-22 DIAGNOSIS — Y929 Unspecified place or not applicable: Secondary | ICD-10-CM | POA: Diagnosis not present

## 2016-03-22 DIAGNOSIS — M546 Pain in thoracic spine: Secondary | ICD-10-CM

## 2016-03-22 DIAGNOSIS — I10 Essential (primary) hypertension: Secondary | ICD-10-CM | POA: Diagnosis not present

## 2016-03-22 DIAGNOSIS — M62830 Muscle spasm of back: Secondary | ICD-10-CM | POA: Diagnosis not present

## 2016-03-22 DIAGNOSIS — Z79899 Other long term (current) drug therapy: Secondary | ICD-10-CM | POA: Insufficient documentation

## 2016-03-22 DIAGNOSIS — T148XXA Other injury of unspecified body region, initial encounter: Secondary | ICD-10-CM

## 2016-03-22 MED ORDER — METHOCARBAMOL 500 MG PO TABS
500.0000 mg | ORAL_TABLET | Freq: Once | ORAL | Status: AC
  Administered 2016-03-22: 500 mg via ORAL
  Filled 2016-03-22: qty 1

## 2016-03-22 MED ORDER — HYDROCODONE-ACETAMINOPHEN 5-325 MG PO TABS
1.0000 | ORAL_TABLET | Freq: Once | ORAL | Status: AC
  Administered 2016-03-22: 1 via ORAL
  Filled 2016-03-22: qty 1

## 2016-03-22 MED ORDER — HYDROCODONE-ACETAMINOPHEN 5-325 MG PO TABS: 1.0000 | ORAL_TABLET | Freq: Three times a day (TID) | ORAL | 0 refills | Status: DC | PRN

## 2016-03-22 MED ORDER — METHOCARBAMOL 500 MG PO TABS
500.0000 mg | ORAL_TABLET | Freq: Two times a day (BID) | ORAL | 0 refills | Status: DC
Start: 2016-03-22 — End: 2017-03-20

## 2016-03-22 NOTE — ED Triage Notes (Signed)
Pt complains of right lower back pain since bending forward and twisting his torso 1 week ago. Pt took some of his wife's muscle relaxer and reports improvement in pain. Pt then bent over to paint yesterday and states pain became worse.

## 2016-03-22 NOTE — ED Provider Notes (Signed)
[place] [facility] DEPT Provider Note   CSN: [id number] Arrival date & time: 03/22/16  1311   By signing my name below, I, Eunice Blase, attest that this documentation has been prepared under the direction and in the presence of [employer], PA-C. Electronically Signed: Eunice Blase, Scribe. 03/22/16. 2:49 PM.   History   Chief Complaint Chief Complaint  Patient presents with  . Back Pain   The history is provided by the patient and medical records. No language interpreter was used.    HPI Comments: Robert [name] is a 66 y.o. male who presents to the Emergency Department complaining of progressive right lower back pain x 1 week. Pt states his pain came on after he bent forward and twisted his torso while attempting to do some house work. He adds he has taken his wife's prescribed muscle relaxers (unknown what kind) with improvement to pain. Pt states he bent over yesterday while painting and his back pain returned much more severely. He currently c/o 9/10, spasm-like, stabbing pain exacerbated with certain movements since last night. He notes Hx of occasional, less severe back pain in the past. He has reported treatment with ibuprofen x 4 with no relief, ace bandage with mild relief, ice with minimal relief, and flexeril (last @ 8:00 AM today) with decreased relief since the exacerbation of his back pain yesterday. Pt denies numbness, tingling, Hx of back surgery, Hx of kidney disorders, abdominal pain, enuresis or arthralgias.  Past Medical History:  Diagnosis Date  . Anxiety   . ED (erectile dysfunction)   . History of gastritis   . History of UTI   . Hx of colonic polyps   . Hyperlipidemia   . Hypertension   . IBS (irritable bowel syndrome)   . Myalgia   . Venous insufficiency     Patient Active Problem List   Diagnosis Date Noted  . Laryngitis 09/13/2015  . Elevated glucose 05/31/2015  . Erectile dysfunction 05/31/2015  . Renal insufficiency 05/20/2014  . Snoring  11/17/2013  . Elevated CK 11/17/2013  . Edema 05/20/2013  . Well adult exam 05/20/2013  . Skin ulcer ([place]) 03/10/2013  . COLONIC POLYPS 06/02/2009  . ANXIETY 10/18/2007  . ERECTILE DYSFUNCTION, MILD 10/18/2007  . VENOUS INSUFFICIENCY 10/18/2007  . GASTRITIS 10/18/2007  . IRRITABLE BOWEL SYNDROME 10/18/2007  . URINARY TRACT INFECTION 10/18/2007  . Myalgia 10/18/2007  . Essential hypertension 10/06/2007  . HYPERLIPIDEMIA, [place] 09/05/2006    Past Surgical History:  Procedure Laterality Date  . APPENDECTOMY    . COLON SURGERY  07/2009   Dr. Osborne Casco tubulovillous adenoma  . VASECTOMY         Home Medications    Prior to Admission medications   Medication Sig Start Date End Date Taking? Authorizing Provider  ALPRAZolam Duanne Moron) 0.5 MG tablet Take 1 tablet (0.5 mg total) by mouth 2 (two) times daily as needed for anxiety. 11/10/14   Aleksei Plotnikov V, MD  amLODipine (NORVASC) 10 MG tablet Take 1 tablet (10 mg total) by mouth daily. 03/12/16   Aleksei Plotnikov V, MD  aspirin 81 MG tablet Take 81 mg by mouth daily.    Historical Provider, MD  Cholecalciferol (VITAMIN D3) 2000 units capsule Take 1 capsule (2,000 Units total) by mouth daily. 03/12/16   Aleksei Plotnikov V, MD  gemfibrozil (LOPID) 600 MG tablet TAKE 1 TABLET BY MOUTH TWICE DAILY BEFORE A MEAL 03/12/16   Aleksei Plotnikov V, MD  HYDROcodone-acetaminophen (NORCO/VICODIN) 5-325 MG tablet Take 1 tablet by mouth every 8 (  eight) hours as needed for moderate pain or severe pain. 03/22/16   Lagina Reader, PA-C  levocetirizine (XYZAL) 5 MG tablet TAKE 1 TABLET BY MOUTH EVERY EVENING 03/19/16   Aleksei Plotnikov V, MD  losartan (COZAAR) 100 MG tablet Take 1 tablet (100 mg total) by mouth daily. 03/12/16   Aleksei Plotnikov V, MD  methocarbamol (ROBAXIN) 500 MG tablet Take 1 tablet (500 mg total) by mouth 2 (two) times daily. 03/22/16   Bobie Caris, PA-C  metoprolol succinate (TOPROL-XL) 100 MG 24 hr tablet Take 1  tablet (100 mg total) by mouth daily. Take with or immediately following a meal. 03/12/16   Aleksei Plotnikov V, MD  Omega-3 Fatty Acids (FISH OIL) 1000 MG CPDR Take by mouth daily.    Historical Provider, MD  omeprazole (PRILOSEC) 20 MG capsule Take 1 capsule (20 mg total) by mouth 2 (two) times daily before a meal. 03/12/16   Cassandria Anger, MD  pramoxine-hydrocortisone [facility]) cream Apply as directed 11/10/14   Cassandria Anger, MD  sildenafil (VIAGRA) 100 MG tablet Take as directed 03/12/16   Cassandria Anger, MD    Family History Family History  Problem Relation Age of Onset  . Colon cancer Cousin     Social History Social History  Substance Use Topics  . Smoking status: Never Smoker  . Smokeless tobacco: Never Used  . Alcohol use No     Comment: social use     Allergies   Atorvastatin   Review of Systems Review of Systems  Gastrointestinal: Negative for abdominal pain.  Genitourinary: Negative for enuresis and flank pain.  Musculoskeletal: Positive for back pain and myalgias. Negative for arthralgias.  Neurological: Negative for weakness and numbness.  All other systems reviewed and are negative.    Physical Exam Updated Vital Signs BP (!) 151/83 (BP Location: Left Arm)   Pulse 66   Temp 98.3 F (36.8 C) (Oral)   Resp 18   Ht 5\' 11"  (1.803 m)   Wt 220 lb (99.8 kg)   SpO2 98%   BMI 30.68 kg/m   Physical Exam  Constitutional: He appears well-developed and well-nourished. No distress.  HENT:  Head: Normocephalic and atraumatic.  Mouth/Throat: Oropharynx is clear and moist. No oropharyngeal exudate.  Eyes: Conjunctivae are normal.  Neck: Normal range of motion. Neck supple.  Full ROM without pain  Cardiovascular: Normal rate, regular rhythm and intact distal pulses.   Pulmonary/Chest: Effort normal and breath sounds normal. No respiratory distress. He has no wheezes.  Abdominal: Soft. He exhibits no distension. There is no tenderness.    Musculoskeletal:  Decreased range of motion of the T-spine and L-spine No midline tenderness to the  T-spine or L-spine Tenderness to palpation of the right paraspinous muscles of the T-spine with palpable muscle spasm  Lymphadenopathy:    He has no cervical adenopathy.  Neurological: He is alert. He has normal reflexes.  Reflex Scores:      Bicep reflexes are 2+ on the right side and 2+ on the left side.      Brachioradialis reflexes are 2+ on the right side and 2+ on the left side.      Patellar reflexes are 2+ on the right side and 2+ on the left side.      Achilles reflexes are 2+ on the right side and 2+ on the left side. Speech is clear and goal oriented, follows commands Normal 5/5 strength in upper and lower extremities bilaterally including dorsiflexion and plantar flexion,  strong and equal grip strength Sensation normal to light and sharp touch Moves extremities without ataxia, coordination intact Normal gait Normal balance No Clonus  Skin: Skin is warm and dry. No rash noted. He is not diaphoretic. No erythema.  Psychiatric: He has a normal mood and affect. His behavior is normal.  Nursing note and vitals reviewed.    ED Treatments / Results  DIAGNOSTIC STUDIES: Oxygen Saturation is 98% on RA, normal by my interpretation.    COORDINATION OF CARE: 2:13 PM Discussed treatment plan with pt at bedside and pt agreed to plan. Will order medications and refer to orthopedist. Pt advised of symptomatic care at home, return precautions and instructed to F/U with orthopedist if symptoms persist > 1 week.  Procedures Procedures (including critical care time)  Medications Ordered in ED Medications  HYDROcodone-acetaminophen (NORCO/VICODIN) 5-325 MG per tablet 1 tablet (1 tablet Oral Given 03/22/16 1430)  methocarbamol (ROBAXIN) tablet 500 mg (500 mg Oral Given 03/22/16 1430)     Initial Impression / Assessment and Plan / ED Course  I have reviewed the triage vital signs and  the nursing notes.  Pertinent labs & imaging results that were available during my care of the patient were reviewed by me and considered in my medical decision making (see chart for details).  Clinical Course as of Mar 23 1447  Thu Mar 22, 2016  [place] narcotic database accessed. Patient without narcotic or controlled substance prescription in the last 6 months.  [HM]    Clinical Course User Index [HM] Abigail Butts, PA-C    Patient with back pain, Secondary to known strain. No trauma, no indication for x-ray at this time. No radiation of pain.  No neurological deficits and normal neuro exam.  Patient can walk without difficulty.  No loss of bowel or bladder control.  No concern for cauda equina.  No fever, night sweats, weight loss, h/o cancer, IVDU.  RICE protocol and pain medicine indicated and discussed with patient and wife who state understanding and are in agreement with the plan.    Final Clinical Impressions(s) / ED Diagnoses   Final diagnoses:  Muscle strain  Muscle spasm of back  Acute right-sided thoracic back pain    New Prescriptions New Prescriptions   HYDROCODONE-ACETAMINOPHEN (NORCO/VICODIN) 5-325 MG TABLET    Take 1 tablet by mouth every 8 (eight) hours as needed for moderate pain or severe pain.   METHOCARBAMOL (ROBAXIN) 500 MG TABLET    Take 1 tablet (500 mg total) by mouth 2 (two) times daily.    I personally performed the services described in this documentation, which was scribed in my presence. The recorded information has been reviewed and is accurate.    Jarrett Soho Cylah Fannin, PA-C 03/22/16 [place], MD 03/22/16 [phone]

## 2016-03-22 NOTE — Discharge Instructions (Signed)
1. Medications: robaxin, Ibuprofen 800mg  3x per day with food, vicodin, usual home medications 2. Treatment: rest, drink plenty of fluids, gentle stretching as discussed, alternate ice and heat 3. Follow Up: Please followup with your primary doctor in 3 days for discussion of your diagnoses and further evaluation after today's visit; if you do not have a primary care doctor use the resource guide provided to find one;  Return to the ER for worsening back pain, difficulty walking, loss of bowel or bladder control or other concerning symptoms

## 2016-04-13 LAB — PSA: PSA: 1.17

## 2016-04-18 ENCOUNTER — Encounter: Payer: Self-pay | Admitting: Internal Medicine

## 2017-01-01 HISTORY — PX: COLONOSCOPY: SHX174

## 2017-01-01 HISTORY — PX: POLYPECTOMY: SHX149

## 2017-03-13 ENCOUNTER — Encounter: Payer: Medicare Other | Admitting: Internal Medicine

## 2017-03-20 ENCOUNTER — Encounter: Payer: Self-pay | Admitting: Internal Medicine

## 2017-03-20 ENCOUNTER — Other Ambulatory Visit (INDEPENDENT_AMBULATORY_CARE_PROVIDER_SITE_OTHER): Payer: Medicare Other

## 2017-03-20 ENCOUNTER — Ambulatory Visit (INDEPENDENT_AMBULATORY_CARE_PROVIDER_SITE_OTHER): Payer: Medicare Other | Admitting: Internal Medicine

## 2017-03-20 VITALS — BP 132/82 | HR 57 | Temp 97.9°F | Ht 71.0 in | Wt 229.0 lb

## 2017-03-20 DIAGNOSIS — N320 Bladder-neck obstruction: Secondary | ICD-10-CM

## 2017-03-20 DIAGNOSIS — I10 Essential (primary) hypertension: Secondary | ICD-10-CM | POA: Diagnosis not present

## 2017-03-20 DIAGNOSIS — N289 Disorder of kidney and ureter, unspecified: Secondary | ICD-10-CM

## 2017-03-20 DIAGNOSIS — Z0000 Encounter for general adult medical examination without abnormal findings: Secondary | ICD-10-CM | POA: Diagnosis not present

## 2017-03-20 DIAGNOSIS — R7309 Other abnormal glucose: Secondary | ICD-10-CM

## 2017-03-20 DIAGNOSIS — Z23 Encounter for immunization: Secondary | ICD-10-CM

## 2017-03-20 DIAGNOSIS — Z1211 Encounter for screening for malignant neoplasm of colon: Secondary | ICD-10-CM | POA: Diagnosis not present

## 2017-03-20 DIAGNOSIS — E785 Hyperlipidemia, unspecified: Secondary | ICD-10-CM

## 2017-03-20 LAB — CBC WITH DIFFERENTIAL/PLATELET
Basophils Absolute: 0 10*3/uL (ref 0.0–0.1)
Basophils Relative: 0.8 % (ref 0.0–3.0)
EOS PCT: 10.2 % — AB (ref 0.0–5.0)
Eosinophils Absolute: 0.4 10*3/uL (ref 0.0–0.7)
HEMATOCRIT: 39.7 % (ref 39.0–52.0)
Hemoglobin: 13.7 g/dL (ref 13.0–17.0)
LYMPHS ABS: 1.5 10*3/uL (ref 0.7–4.0)
Lymphocytes Relative: 37.8 % (ref 12.0–46.0)
MCHC: 34.6 g/dL (ref 30.0–36.0)
MCV: 81.3 fl (ref 78.0–100.0)
MONOS PCT: 10 % (ref 3.0–12.0)
Monocytes Absolute: 0.4 10*3/uL (ref 0.1–1.0)
NEUTROS PCT: 41.2 % — AB (ref 43.0–77.0)
Neutro Abs: 1.6 10*3/uL (ref 1.4–7.7)
Platelets: 262 10*3/uL (ref 150.0–400.0)
RBC: 4.89 Mil/uL (ref 4.22–5.81)
RDW: 13.9 % (ref 11.5–15.5)
WBC: 3.9 10*3/uL — ABNORMAL LOW (ref 4.0–10.5)

## 2017-03-20 LAB — BASIC METABOLIC PANEL
BUN: 15 mg/dL (ref 6–23)
CALCIUM: 10.1 mg/dL (ref 8.4–10.5)
CHLORIDE: 105 meq/L (ref 96–112)
CO2: 25 mEq/L (ref 19–32)
Creatinine, Ser: 1.16 mg/dL (ref 0.40–1.50)
GFR: 80.72 mL/min (ref >60.00)
GLUCOSE: 123 mg/dL — AB (ref 70–99)
Potassium: 3.7 mEq/L (ref 3.5–5.1)
SODIUM: 143 meq/L (ref 135–145)

## 2017-03-20 LAB — HEPATIC FUNCTION PANEL
ALK PHOS: 66 U/L (ref 39–117)
ALT: 31 U/L (ref 0–53)
AST: 42 U/L — AB (ref 0–37)
Albumin: 5 g/dL (ref 3.5–5.2)
BILIRUBIN TOTAL: 0.4 mg/dL (ref 0.2–1.2)
Bilirubin, Direct: 0.1 mg/dL (ref 0.0–0.3)
Total Protein: 7.7 g/dL (ref 6.0–8.3)

## 2017-03-20 LAB — LIPID PANEL
CHOL/HDL RATIO: 3
Cholesterol: 144 mg/dL (ref 0–200)
HDL: 51.1 mg/dL (ref >39.00)
LDL CALC: 72 mg/dL (ref 0–99)
NonHDL: 93.03
TRIGLYCERIDES: 103 mg/dL (ref 0.0–149.0)
VLDL: 20.6 mg/dL (ref 0.0–40.0)

## 2017-03-20 LAB — URINALYSIS
Bilirubin Urine: NEGATIVE
Hgb urine dipstick: NEGATIVE
KETONES UR: NEGATIVE
LEUKOCYTES UA: NEGATIVE
NITRITE: NEGATIVE
PH: 7 (ref 5.0–8.0)
SPECIFIC GRAVITY, URINE: 1.015 (ref 1.000–1.030)
Total Protein, Urine: NEGATIVE
Urine Glucose: NEGATIVE
Urobilinogen, UA: 0.2 (ref 0.0–1.0)

## 2017-03-20 LAB — HEMOGLOBIN A1C: Hgb A1c MFr Bld: 6.4 % (ref 4.6–6.5)

## 2017-03-20 LAB — TSH: TSH: 2.27 u[IU]/mL (ref 0.35–4.50)

## 2017-03-20 LAB — PSA: PSA: 2.19 ng/mL (ref 0.10–4.00)

## 2017-03-20 MED ORDER — METOPROLOL SUCCINATE ER 100 MG PO TB24: 100.0000 mg | ORAL_TABLET | Freq: Every day | ORAL | 3 refills | Status: DC

## 2017-03-20 MED ORDER — LOSARTAN POTASSIUM 100 MG PO TABS: 100.0000 mg | ORAL_TABLET | Freq: Every day | ORAL | 3 refills | Status: DC

## 2017-03-20 MED ORDER — OMEPRAZOLE 20 MG PO CPDR: 20.0000 mg | DELAYED_RELEASE_CAPSULE | Freq: Two times a day (BID) | ORAL | 3 refills | Status: DC

## 2017-03-20 MED ORDER — LEVOCETIRIZINE DIHYDROCHLORIDE 5 MG PO TABS: 5.0000 mg | ORAL_TABLET | Freq: Every evening | ORAL | 3 refills | Status: DC

## 2017-03-20 MED ORDER — SILDENAFIL CITRATE 100 MG PO TABS: ORAL_TABLET | ORAL | 2 refills | Status: DC

## 2017-03-20 MED ORDER — ALPRAZOLAM 0.5 MG PO TABS: 0.5000 mg | ORAL_TABLET | Freq: Two times a day (BID) | ORAL | 1 refills | Status: DC | PRN

## 2017-03-20 MED ORDER — ZOSTER VAC RECOMB ADJUVANTED 50 MCG/0.5ML IM SUSR: 0.5000 mL | Freq: Once | INTRAMUSCULAR | 1 refills | Status: AC

## 2017-03-20 MED ORDER — GEMFIBROZIL 600 MG PO TABS: ORAL_TABLET | ORAL | 3 refills | Status: DC

## 2017-03-20 MED ORDER — AMLODIPINE BESYLATE 10 MG PO TABS: 10.0000 mg | ORAL_TABLET | Freq: Every day | ORAL | 2 refills | Status: DC

## 2017-03-20 NOTE — Assessment & Plan Note (Signed)
Labs

## 2017-03-20 NOTE — Addendum Note (Signed)
Addended by: Karren Cobble on: 03/20/2017 11:50 AM   Modules accepted: Orders

## 2017-03-20 NOTE — Assessment & Plan Note (Addendum)
Here for medicare wellness/physical  Diet: heart healthy  Physical activity: not sedentary - very active Depression/mood screen: negative  Hearing: intact to whispered voice  Visual acuity: grossly normal w/glasses, performs annual eye exam  ADLs: capable  Fall risk: low to none  Home safety: good  Cognitive evaluation: intact to orientation, naming, recall and repetition  EOL planning: adv directives, full code/ I agree  I have personally reviewed and have noted  1. The patient's medical, surgical and social history  2. Their use of alcohol, tobacco or illicit drugs  3. Their current medications and supplements  4. The patient's functional ability including ADL's, fall risks, home safety risks and hearing or visual impairment.  5. Diet and physical activities  6. Evidence for depression or mood disorders 7. The roster of all physicians providing medical care to patient - is listed in the Snapshot section of the chart and reviewed today.    Today patient counseled on age appropriate routine health concerns for screening and prevention, each reviewed and up to date or declined. Immunizations reviewed and up to date or declined. Labs ordered and reviewed. Risk factors for depression reviewed and negative. Hearing function and visual acuity are intact. ADLs screened and addressed as needed. Functional ability and level of safety reviewed and appropriate. Education, counseling and referrals performed based on assessed risks today. Patient provided with a copy of personalized plan for preventive services.  Colon due 2019

## 2017-03-20 NOTE — Assessment & Plan Note (Signed)
Amlodipine, Losartan 

## 2017-03-20 NOTE — Progress Notes (Signed)
Subjective:  Patient ID: Robert [name], male    DOB: 1950/08/23  Age: 67 y.o. MRN: [id number]  CC: No chief complaint on file.   HPI Robert [name] presents for a well exam F/u HTN, CRI, elev glu Exercising a lot  Outpatient Medications Prior to Visit  Medication Sig Dispense Refill  . ALPRAZolam (XANAX) 0.5 MG tablet Take 1 tablet (0.5 mg total) by mouth 2 (two) times daily as needed for anxiety. 180 tablet 1  . amLODipine (NORVASC) 10 MG tablet Take 1 tablet (10 mg total) by mouth daily. 90 tablet 2  . aspirin 81 MG tablet Take 81 mg by mouth daily.    . Cholecalciferol (VITAMIN D3) 2000 units capsule Take 1 capsule (2,000 Units total) by mouth daily. 100 capsule 3  . gemfibrozil (LOPID) 600 MG tablet TAKE 1 TABLET BY MOUTH TWICE DAILY BEFORE A MEAL 180 tablet 3  . HYDROcodone-acetaminophen (NORCO/VICODIN) 5-325 MG tablet Take 1 tablet by mouth every 8 (eight) hours as needed for moderate pain or severe pain. 11 tablet 0  . levocetirizine (XYZAL) 5 MG tablet TAKE 1 TABLET BY MOUTH EVERY EVENING 90 tablet 3  . losartan (COZAAR) 100 MG tablet Take 1 tablet (100 mg total) by mouth daily. 90 tablet 3  . methocarbamol (ROBAXIN) 500 MG tablet Take 1 tablet (500 mg total) by mouth 2 (two) times daily. 20 tablet 0  . metoprolol succinate (TOPROL-XL) 100 MG 24 hr tablet Take 1 tablet (100 mg total) by mouth daily. Take with or immediately following a meal. 90 tablet 3  . Omega-3 Fatty Acids (FISH OIL) 1000 MG CPDR Take by mouth daily.    Marland Kitchen omeprazole (PRILOSEC) 20 MG capsule Take 1 capsule (20 mg total) by mouth 2 (two) times daily before a meal. 180 capsule 3  . pramoxine-hydrocortisone (ANALPRAM HC) cream Apply as directed 30 g 2  . sildenafil (VIAGRA) 100 MG tablet Take as directed 30 tablet 2   No facility-administered medications prior to visit.     ROS Review of Systems  Constitutional: Negative for appetite change, fatigue and unexpected weight change.  HENT: Negative for congestion,  nosebleeds, sneezing, sore throat and trouble swallowing.   Eyes: Negative for itching and visual disturbance.  Respiratory: Negative for cough.   Cardiovascular: Negative for chest pain, palpitations and leg swelling.  Gastrointestinal: Negative for abdominal distention, blood in stool, diarrhea and nausea.  Genitourinary: Negative for frequency and hematuria.  Musculoskeletal: Negative for back pain, gait problem, joint swelling and neck pain.  Skin: Negative for rash.  Neurological: Negative for dizziness, tremors, speech difficulty and weakness.  Psychiatric/Behavioral: Negative for agitation, dysphoric mood and sleep disturbance. The patient is not nervous/anxious.     Objective:  BP 132/82 (BP Location: Left Arm, Patient Position: Sitting, Cuff Size: Large)   Pulse (!) 57   Temp 97.9 F (36.6 C) (Oral)   Ht 5\' 11"  (1.803 m)   Wt 229 lb (103.9 kg)   SpO2 98%   BMI 31.94 kg/m   BP Readings from Last 3 Encounters:  03/20/17 132/82  03/22/16 (!) 151/83  03/12/16 128/78    Wt Readings from Last 3 Encounters:  03/20/17 229 lb (103.9 kg)  03/22/16 220 lb (99.8 kg)  03/12/16 227 lb 4 oz (103.1 kg)    Physical Exam  Constitutional: He is oriented to person, place, and time. He appears well-developed. No distress.  NAD  HENT:  Mouth/Throat: Oropharynx is clear and moist.  Eyes: Conjunctivae are normal. Pupils are  equal, round, and reactive to light.  Neck: Normal range of motion. No JVD present. No thyromegaly present.  Cardiovascular: Normal rate, regular rhythm, normal heart sounds and intact distal pulses. Exam reveals no gallop and no friction rub.  No murmur heard. Pulmonary/Chest: Effort normal and breath sounds normal. No respiratory distress. He has no wheezes. He has no rales. He exhibits no tenderness.  Abdominal: Soft. Bowel sounds are normal. He exhibits no distension and no mass. There is no tenderness. There is no rebound and no guarding.  Genitourinary: Rectum  normal and prostate normal. Rectal exam shows guaiac negative stool. No penile tenderness.  Musculoskeletal: Normal range of motion. He exhibits no edema or tenderness.  Lymphadenopathy:    He has no cervical adenopathy.  Neurological: He is alert and oriented to person, place, and time. He has normal reflexes. No cranial nerve deficit. He exhibits normal muscle tone. He displays a negative Romberg sign. Coordination and gait normal.  Skin: Skin is warm and dry. No rash noted.  Psychiatric: He has a normal mood and affect. His behavior is normal. Judgment and thought content normal.  prostate 1+  Lab Results  Component Value Date   WBC 3.8 (L) 03/12/2016   HGB 13.6 03/12/2016   HCT 39.4 03/12/2016   PLT 279.0 03/12/2016   GLUCOSE 118 (H) 03/12/2016   CHOL 153 03/12/2016   TRIG 121.0 03/12/2016   HDL 43.00 03/12/2016   LDLCALC 85 03/12/2016   ALT 23 03/12/2016   AST 28 03/12/2016   NA 139 03/12/2016   K 4.1 03/12/2016   CL 104 03/12/2016   CREATININE 1.33 03/12/2016   BUN 20 03/12/2016   CO2 28 03/12/2016   TSH 1.85 03/12/2016   PSA 1.17 04/13/2016   HGBA1C 6.7 (H) 03/12/2016    No results found.  Assessment & Plan:   There are no diagnoses linked to this encounter. I am having Robert [name] maintain his aspirin, Fish Oil, ALPRAZolam, pramoxine-hydrocortisone, amLODipine, gemfibrozil, losartan, metoprolol succinate, omeprazole, sildenafil, Vitamin D3, levocetirizine, methocarbamol, and HYDROcodone-acetaminophen.  No orders of the defined types were placed in this encounter.    Follow-up: No Follow-up on file.  Walker Kehr, MD

## 2017-03-20 NOTE — Patient Instructions (Signed)
Try Valerian root at bedtime

## 2017-03-20 NOTE — Addendum Note (Signed)
Addended by: Cassandria Anger on: 03/20/2017 10:01 AM   Modules accepted: Orders

## 2017-03-22 ENCOUNTER — Encounter: Payer: Self-pay | Admitting: Gastroenterology

## 2017-05-01 ENCOUNTER — Encounter: Payer: Self-pay | Admitting: Internal Medicine

## 2017-05-13 ENCOUNTER — Other Ambulatory Visit: Payer: Self-pay | Admitting: Internal Medicine

## 2017-06-11 DIAGNOSIS — Z860100 Personal history of colon polyps, unspecified: Secondary | ICD-10-CM | POA: Insufficient documentation

## 2017-06-28 ENCOUNTER — Encounter: Payer: Medicare Other | Admitting: Gastroenterology

## 2017-07-03 ENCOUNTER — Other Ambulatory Visit: Payer: Self-pay

## 2017-07-03 ENCOUNTER — Ambulatory Visit (AMBULATORY_SURGERY_CENTER): Payer: Self-pay | Admitting: *Deleted

## 2017-07-03 VITALS — Ht 71.0 in | Wt 224.4 lb

## 2017-07-03 DIAGNOSIS — Z86010 Personal history of colonic polyps: Secondary | ICD-10-CM

## 2017-07-03 NOTE — Progress Notes (Signed)
No egg or soy allergy known to patient  No issues with past sedation with any surgeries  or procedures, no intubation problems  No diet pills per patient No home 02 use per patient  No blood thinners per patient  Pt denies issues with constipation  No A fib or A flutter  EMMI video offered, patient declined

## 2017-07-17 ENCOUNTER — Encounter: Payer: Self-pay | Admitting: Internal Medicine

## 2017-07-17 ENCOUNTER — Ambulatory Visit (AMBULATORY_SURGERY_CENTER): Payer: Medicare Other | Admitting: Internal Medicine

## 2017-07-17 VITALS — BP 106/61 | HR 53 | Temp 98.6°F | Resp 12 | Ht 71.0 in | Wt 224.0 lb

## 2017-07-17 DIAGNOSIS — D123 Benign neoplasm of transverse colon: Secondary | ICD-10-CM

## 2017-07-17 DIAGNOSIS — Z86010 Personal history of colonic polyps: Secondary | ICD-10-CM

## 2017-07-17 MED ORDER — SODIUM CHLORIDE 0.9 % IV SOLN: 500.0000 mL | Freq: Once | INTRAVENOUS | Status: DC

## 2017-07-17 NOTE — Op Note (Signed)
[name] Patient Name: Robert [name] Procedure Date: 07/17/2017 9:30 AM MRN: [id number] Endoscopist: Gatha Mayer , MD Age: 67 Referring MD:  Date of Birth: 05/08/1950 Gender: Male Account #: [id number] Procedure:                Colonoscopy Indications:              Surveillance: Personal history of adenomatous                            polyps on last colonoscopy 5 years ago Medicines:                Propofol per Anesthesia, Monitored Anesthesia Care Procedure:                Pre-Anesthesia Assessment:                           - Prior to the procedure, a History and Physical                            was performed, and patient medications and                            allergies were reviewed. The patient's tolerance of                            previous anesthesia was also reviewed. The risks                            and benefits of the procedure and the sedation                            options and risks were discussed with the patient.                            All questions were answered, and informed consent                            was obtained. Prior Anticoagulants: The patient has                            taken no previous anticoagulant or antiplatelet                            agents. ASA Grade Assessment: II - A patient with                            mild systemic disease. After reviewing the risks                            and benefits, the patient was deemed in                            satisfactory condition to undergo the procedure.  After obtaining informed consent, the colonoscope                            was passed under direct vision. Throughout the                            procedure, the patient's blood pressure, pulse, and                            oxygen saturations were monitored continuously. The                            Colonoscope was introduced through the anus and   advanced to the the ileocolonic anastomosis. The                            colonoscopy was performed without difficulty. The                            patient tolerated the procedure well. The quality                            of the bowel preparation was excellent. The rectum                            and Ileocolonic anastomsis areas were photographed.                            The bowel preparation used was Miralax. Scope In: 9:37:30 AM Scope Out: 9:50:39 AM Scope Withdrawal Time: 0 hours 10 minutes 51 seconds  Total Procedure Duration: 0 hours 13 minutes 9 seconds  Findings:                 The perianal and digital rectal examinations were                            normal. Pertinent negatives include normal prostate                            (size, shape, and consistency).                           A 2 to 3 mm polyp was found in the transverse                            colon. The polyp was sessile. The polyp was removed                            with a cold biopsy forceps. Resection and retrieval                            were complete. Verification of patient  identification for the specimen was done. Estimated                            blood loss was minimal.                           There was evidence of a prior surgical anastomosis                            in the transverse colon. This was patent and was                            characterized by healthy appearing mucosa.                           The exam was otherwise without abnormality on                            direct and retroflexion views. Complications:            No immediate complications. Estimated Blood Loss:     Estimated blood loss was minimal. Impression:               - One 2 to 3 mm polyp in the transverse colon,                            removed with a cold biopsy forceps. Resected and                            retrieved.                           - Patent surgical  anastomosis, characterized by                            healthy appearing mucosa.                           - The examination was otherwise normal on direct                            and retroflexion views.                           - Personal history of colonic polyp TV adenoma                            resected 2011. Recommendation:           - Patient has a contact number available for                            emergencies. The signs and symptoms of potential                            delayed complications were discussed with the  patient. Return to normal activities tomorrow.                            Written discharge instructions were provided to the                            patient.                           - Resume previous diet.                           - Continue present medications.                           - Repeat colonoscopy in 5 years for surveillance. Gatha Mayer, MD 07/17/2017 10:00:34 AM This report has been signed electronically.

## 2017-07-17 NOTE — Patient Instructions (Addendum)
I found and removed one tiny polyp - I am certain it is benign but will prove it and let you know.  Your next routine colonoscopy should be in 5 years - 2024.  I appreciate the opportunity to care for you. Gatha Mayer, MD, FACG YOU HAD AN ENDOSCOPIC PROCEDURE TODAY AT [facility]:   Refer to the procedure report that was given to you for any specific questions about what was found during the examination.  If the procedure report does not answer your questions, please call your gastroenterologist to clarify.  If you requested that your care partner not be given the details of your procedure findings, then the procedure report has been included in a sealed envelope for you to review at your convenience later.  YOU SHOULD EXPECT: Some feelings of bloating in the abdomen. Passage of more gas than usual.  Walking can help get rid of the air that was put into your GI tract during the procedure and reduce the bloating. If you had a lower endoscopy (such as a colonoscopy or flexible sigmoidoscopy) you may notice spotting of blood in your stool or on the toilet paper. If you underwent a bowel prep for your procedure, you may not have a normal bowel movement for a few days.  Please Note:  You might notice some irritation and congestion in your nose or some drainage.  This is from the oxygen used during your procedure.  There is no need for concern and it should clear up in a day or so.  SYMPTOMS TO REPORT IMMEDIATELY:   Following lower endoscopy (colonoscopy or flexible sigmoidoscopy):  Excessive amounts of blood in the stool  Significant tenderness or worsening of abdominal pains  Swelling of the abdomen that is new, acute  Fever of 100F or higher   For urgent or emergent issues, a gastroenterologist can be reached at any hour by calling [phone].   DIET:  We do recommend a small meal at first, but then you may proceed to your regular diet.  Drink plenty of fluids  but you should avoid alcoholic beverages for 24 hours.  MEDICATIONS: Continue present medications.  Please see handouts given to you by your recovery nurse.  ACTIVITY:  You should plan to take it easy for the rest of today and you should NOT DRIVE or use heavy machinery until tomorrow (because of the sedation medicines used during the test).    FOLLOW UP: Our staff will call the number listed on your records the next business day following your procedure to check on you and address any questions or concerns that you may have regarding the information given to you following your procedure. If we do not reach you, we will leave a message.  However, if you are feeling well and you are not experiencing any problems, there is no need to return our call.  We will assume that you have returned to your regular daily activities without incident.  If any biopsies were taken you will be contacted by phone or by letter within the next 1-3 weeks.  Please call us at [phone] if you have not heard about the biopsies in 3 weeks.   Thank you for allowing [place] to provide for your healthcare needs today.   SIGNATURES/CONFIDENTIALITY: You and/or your care partner have signed paperwork which will be entered into your electronic medical record.  These signatures attest to the fact that that the information above on your After Visit Summary  has been reviewed and is understood.  Full responsibility of the confidentiality of this discharge information lies with you and/or your care-partner. 

## 2017-07-17 NOTE — Progress Notes (Signed)
Report given to PACU, vss 

## 2017-07-17 NOTE — Progress Notes (Signed)
Pt's states no medical or surgical changes since previsit or office visit. 

## 2017-07-17 NOTE — Progress Notes (Signed)
Called to room to assist during endoscopic procedure.  Patient ID and intended procedure confirmed with present staff. Received instructions for my participation in the procedure from the performing physician.  

## 2017-07-18 ENCOUNTER — Telehealth: Payer: Self-pay

## 2017-07-18 NOTE — Telephone Encounter (Signed)
Attempted to reach pt. With follow-up call following endoscopic procedure 07/17/2017.  No. Busy.  Will try to reach pat. Again later today.

## 2017-07-18 NOTE — Telephone Encounter (Signed)
Second post procedure follow up call, no answer 

## 2017-07-24 ENCOUNTER — Encounter: Payer: Self-pay | Admitting: Internal Medicine

## 2017-07-24 NOTE — Progress Notes (Signed)
Benign mucosal poly[ Recall 2024 since had large TV adenoma in past My Chart letter

## 2017-10-14 MED ORDER — AMLODIPINE BESYLATE 10 MG PO TABS: 10.0000 mg | ORAL_TABLET | Freq: Every day | ORAL | 1 refills | Status: DC

## 2017-10-14 MED ORDER — LOSARTAN POTASSIUM 100 MG PO TABS: 100.0000 mg | ORAL_TABLET | Freq: Every day | ORAL | 1 refills | Status: DC

## 2017-10-14 MED ORDER — METOPROLOL SUCCINATE ER 100 MG PO TB24: 100.0000 mg | ORAL_TABLET | Freq: Every day | ORAL | 1 refills | Status: DC

## 2018-03-20 ENCOUNTER — Other Ambulatory Visit: Payer: Self-pay | Admitting: Internal Medicine

## 2018-03-26 ENCOUNTER — Other Ambulatory Visit: Payer: Self-pay | Admitting: Internal Medicine

## 2018-03-26 ENCOUNTER — Ambulatory Visit (INDEPENDENT_AMBULATORY_CARE_PROVIDER_SITE_OTHER): Payer: Medicare Other | Admitting: Internal Medicine

## 2018-03-26 ENCOUNTER — Encounter: Payer: Self-pay | Admitting: Internal Medicine

## 2018-03-26 ENCOUNTER — Other Ambulatory Visit: Payer: Self-pay

## 2018-03-26 ENCOUNTER — Other Ambulatory Visit (INDEPENDENT_AMBULATORY_CARE_PROVIDER_SITE_OTHER): Payer: Medicare Other

## 2018-03-26 VITALS — BP 152/92 | HR 60 | Temp 97.6°F | Ht 71.0 in | Wt 226.0 lb

## 2018-03-26 DIAGNOSIS — R7309 Other abnormal glucose: Secondary | ICD-10-CM

## 2018-03-26 DIAGNOSIS — I10 Essential (primary) hypertension: Secondary | ICD-10-CM

## 2018-03-26 DIAGNOSIS — F411 Generalized anxiety disorder: Secondary | ICD-10-CM | POA: Diagnosis not present

## 2018-03-26 DIAGNOSIS — N529 Male erectile dysfunction, unspecified: Secondary | ICD-10-CM

## 2018-03-26 DIAGNOSIS — Z0000 Encounter for general adult medical examination without abnormal findings: Secondary | ICD-10-CM | POA: Diagnosis not present

## 2018-03-26 LAB — PSA: PSA: 1.31 ng/mL (ref 0.10–4.00)

## 2018-03-26 LAB — LIPID PANEL
CHOL/HDL RATIO: 3
Cholesterol: 166 mg/dL (ref 0–200)
HDL: 47.5 mg/dL (ref >39.00)
LDL Cholesterol: 94 mg/dL (ref 0–99)
NonHDL: 118.45
Triglycerides: 124 mg/dL (ref 0.0–149.0)
VLDL: 24.8 mg/dL (ref 0.0–40.0)

## 2018-03-26 LAB — TSH: TSH: 2.01 u[IU]/mL (ref 0.35–4.50)

## 2018-03-26 LAB — URINALYSIS
Bilirubin Urine: NEGATIVE
Hgb urine dipstick: NEGATIVE
Ketones, ur: NEGATIVE
Leukocytes,Ua: NEGATIVE
Nitrite: NEGATIVE
Specific Gravity, Urine: 1.02 (ref 1.000–1.030)
TOTAL PROTEIN, URINE-UPE24: NEGATIVE
Urine Glucose: NEGATIVE
Urobilinogen, UA: 0.2 (ref 0.0–1.0)
pH: 7 (ref 5.0–8.0)

## 2018-03-26 LAB — HEPATIC FUNCTION PANEL
ALT: 35 U/L (ref 0–53)
AST: 45 U/L — ABNORMAL HIGH (ref 0–37)
Albumin: 4.9 g/dL (ref 3.5–5.2)
Alkaline Phosphatase: 66 U/L (ref 39–117)
Bilirubin, Direct: 0.2 mg/dL (ref 0.0–0.3)
Total Bilirubin: 0.6 mg/dL (ref 0.2–1.2)
Total Protein: 7.7 g/dL (ref 6.0–8.3)

## 2018-03-26 LAB — CBC WITH DIFFERENTIAL/PLATELET
BASOS ABS: 0 10*3/uL (ref 0.0–0.1)
Basophils Relative: 1 % (ref 0.0–3.0)
Eosinophils Absolute: 0.4 10*3/uL (ref 0.0–0.7)
Eosinophils Relative: 10.6 % — ABNORMAL HIGH (ref 0.0–5.0)
HCT: 40.4 % (ref 39.0–52.0)
Hemoglobin: 13.9 g/dL (ref 13.0–17.0)
LYMPHS ABS: 1.7 10*3/uL (ref 0.7–4.0)
Lymphocytes Relative: 40.5 % (ref 12.0–46.0)
MCHC: 34.3 g/dL (ref 30.0–36.0)
MCV: 82.8 fl (ref 78.0–100.0)
Monocytes Absolute: 0.4 10*3/uL (ref 0.1–1.0)
Monocytes Relative: 10 % (ref 3.0–12.0)
NEUTROS PCT: 37.9 % — AB (ref 43.0–77.0)
Neutro Abs: 1.6 10*3/uL (ref 1.4–7.7)
Platelets: 285 10*3/uL (ref 150.0–400.0)
RBC: 4.88 Mil/uL (ref 4.22–5.81)
RDW: 13.7 % (ref 11.5–15.5)
WBC: 4.1 10*3/uL (ref 4.0–10.5)

## 2018-03-26 LAB — BASIC METABOLIC PANEL
BUN: 16 mg/dL (ref 6–23)
CO2: 29 mEq/L (ref 19–32)
Calcium: 10 mg/dL (ref 8.4–10.5)
Chloride: 104 mEq/L (ref 96–112)
Creatinine, Ser: 1.2 mg/dL (ref 0.40–1.50)
GFR: 72.81 mL/min (ref >60.00)
GLUCOSE: 105 mg/dL — AB (ref 70–99)
Potassium: 3.9 mEq/L (ref 3.5–5.1)
Sodium: 141 mEq/L (ref 135–145)

## 2018-03-26 LAB — HEMOGLOBIN A1C: Hgb A1c MFr Bld: 6.5 % (ref 4.6–6.5)

## 2018-03-26 MED ORDER — ZOSTER VAC RECOMB ADJUVANTED 50 MCG/0.5ML IM SUSR: 0.5000 mL | Freq: Once | INTRAMUSCULAR | 1 refills | Status: AC

## 2018-03-26 MED ORDER — LEVOCETIRIZINE DIHYDROCHLORIDE 5 MG PO TABS: 5.0000 mg | ORAL_TABLET | Freq: Every evening | ORAL | 3 refills | Status: DC

## 2018-03-26 MED ORDER — GEMFIBROZIL 600 MG PO TABS: ORAL_TABLET | ORAL | 3 refills | Status: DC

## 2018-03-26 MED ORDER — OMEPRAZOLE 20 MG PO CPDR: 20.0000 mg | DELAYED_RELEASE_CAPSULE | Freq: Two times a day (BID) | ORAL | 3 refills | Status: DC

## 2018-03-26 MED ORDER — METOPROLOL SUCCINATE ER 100 MG PO TB24: 100.0000 mg | ORAL_TABLET | Freq: Every day | ORAL | 3 refills | Status: DC

## 2018-03-26 MED ORDER — ALPRAZOLAM 0.5 MG PO TABS: 0.5000 mg | ORAL_TABLET | Freq: Two times a day (BID) | ORAL | 1 refills | Status: DC | PRN

## 2018-03-26 MED ORDER — AMLODIPINE BESYLATE 10 MG PO TABS: 10.0000 mg | ORAL_TABLET | Freq: Every day | ORAL | 3 refills | Status: DC

## 2018-03-26 MED ORDER — TAMSULOSIN HCL 0.4 MG PO CAPS: 0.4000 mg | ORAL_CAPSULE | Freq: Every day | ORAL | 3 refills | Status: DC

## 2018-03-26 MED ORDER — LOSARTAN POTASSIUM 100 MG PO TABS: 100.0000 mg | ORAL_TABLET | Freq: Every day | ORAL | 3 refills | Status: DC

## 2018-03-26 NOTE — Patient Instructions (Signed)

## 2018-03-26 NOTE — Assessment & Plan Note (Signed)
Viagra

## 2018-03-26 NOTE — Assessment & Plan Note (Addendum)
Here for medicare wellness/physical  Diet: heart healthy  Physical activity: not sedentary - very active Depression/mood screen: negative  Hearing: intact to whispered voice  Visual acuity: grossly normal w/glasses, performs annual eye exam  ADLs: capable  Fall risk: low to none  Home safety: good  Cognitive evaluation: intact to orientation, naming, recall and repetition  EOL planning: adv directives, full code/ I agree  I have personally reviewed and have noted  1. The patient's medical, surgical and social history  2. Their use of alcohol, tobacco or illicit drugs  3. Their current medications and supplements  4. The patient's functional ability including ADL's, fall risks, home safety risks and hearing or visual impairment.  5. Diet and physical activities  6. Evidence for depression or mood disorders 7. The roster of all physicians providing medical care to patient - is listed in the Snapshot section of the chart and reviewed today.    Today patient counseled on age appropriate routine health concerns for screening and prevention, each reviewed and up to date or declined. Immunizations reviewed and up to date or declined. Labs ordered and reviewed. Risk factors for depression reviewed and negative. Hearing function and visual acuity are intact. ADLs screened and addressed as needed. Functional ability and level of safety reviewed and appropriate. Education, counseling and referrals performed based on assessed risks today. Patient provided with a copy of personalized plan for preventive services.  Shingrix Rx  CT ca scoring info given

## 2018-03-26 NOTE — Progress Notes (Signed)
Subjective:  Patient ID: Robert [name], male    DOB: 1950/01/15  Age: 68 y.o. MRN: [id number]  CC: No chief complaint on file.   HPI Robert [name] presents for HTN, dyslipidemia, ED f/u Well exam  Outpatient Medications Prior to Visit  Medication Sig Dispense Refill  . ALPRAZolam (XANAX) 0.5 MG tablet Take 1 tablet (0.5 mg total) by mouth 2 (two) times daily as needed for anxiety. 180 tablet 1  . amLODipine (NORVASC) 10 MG tablet Take 1 tablet (10 mg total) by mouth daily. 90 tablet 1  . Ascorbic Acid (VITAMIN C) 100 MG tablet Take 100 mg by mouth daily.    Marland Kitchen aspirin 81 MG tablet Take 81 mg by mouth daily.    . Cholecalciferol (VITAMIN D3) 2000 units capsule Take 1 capsule (2,000 Units total) by mouth daily. 100 capsule 3  . gemfibrozil (LOPID) 600 MG tablet TAKE 1 TABLET BY MOUTH TWICE DAILY BEFORE A MEAL 180 tablet 3  . KRILL OIL PO Take by mouth.    . levocetirizine (XYZAL) 5 MG tablet TAKE 1 TABLET BY MOUTH EVERY EVENING 90 tablet 3  . losartan (COZAAR) 100 MG tablet Take 1 tablet (100 mg total) by mouth daily. 90 tablet 1  . metoprolol succinate (TOPROL-XL) 100 MG 24 hr tablet Take 1 tablet (100 mg total) by mouth daily. Take with or immediately following a meal. 90 tablet 1  . Omega-3 Fatty Acids (FISH OIL) 1000 MG CPDR Take by mouth daily.    Marland Kitchen omeprazole (PRILOSEC) 20 MG capsule Take 1 capsule (20 mg total) by mouth 2 (two) times daily before a meal. 180 capsule 3  . POTASSIUM PO Take by mouth.    . sildenafil (VIAGRA) 100 MG tablet Take as directed 30 tablet 2   No facility-administered medications prior to visit.     ROS: Review of Systems  Constitutional: Negative for appetite change, fatigue and unexpected weight change.  HENT: Negative for congestion, nosebleeds, sneezing, sore throat and trouble swallowing.   Eyes: Negative for itching and visual disturbance.  Respiratory: Negative for cough.   Cardiovascular: Negative for chest pain, palpitations and leg swelling.   Gastrointestinal: Negative for abdominal distention, blood in stool, diarrhea and nausea.  Genitourinary: Negative for frequency and hematuria.  Musculoskeletal: Negative for back pain, gait problem, joint swelling and neck pain.  Skin: Negative for rash.  Neurological: Negative for dizziness, tremors, speech difficulty and weakness.  Psychiatric/Behavioral: Negative for agitation, dysphoric mood, sleep disturbance and suicidal ideas. The patient is nervous/anxious.     Objective:  BP (!) 152/92 (BP Location: Left Arm, Patient Position: Sitting, Cuff Size: Large)   Pulse 60   Temp 97.6 F (36.4 C) (Oral)   Ht 5\' 11"  (1.803 m)   Wt 226 lb (102.5 kg)   SpO2 98%   BMI 31.52 kg/m   BP Readings from Last 3 Encounters:  03/26/18 (!) 152/92  07/17/17 106/61  03/20/17 132/82    Wt Readings from Last 3 Encounters:  03/26/18 226 lb (102.5 kg)  07/17/17 224 lb (101.6 kg)  07/03/17 224 lb 6.4 oz (101.8 kg)    Physical Exam Constitutional:      General: He is not in acute distress.    Appearance: He is well-developed.     Comments: NAD  Eyes:     Conjunctiva/sclera: Conjunctivae normal.     Pupils: Pupils are equal, round, and reactive to light.  Neck:     Musculoskeletal: Normal range of motion.  Thyroid: No thyromegaly.     Vascular: No JVD.  Cardiovascular:     Rate and Rhythm: Normal rate and regular rhythm.     Heart sounds: Normal heart sounds. No murmur. No friction rub. No gallop.   Pulmonary:     Effort: Pulmonary effort is normal. No respiratory distress.     Breath sounds: Normal breath sounds. No wheezing or rales.  Chest:     Chest wall: No tenderness.  Abdominal:     General: Bowel sounds are normal. There is no distension.     Palpations: Abdomen is soft. There is no mass.     Tenderness: There is no abdominal tenderness. There is no guarding or rebound.  Genitourinary:    Rectum: Guaiac result negative.  Musculoskeletal: Normal range of motion.         General: No tenderness.  Lymphadenopathy:     Cervical: No cervical adenopathy.  Skin:    General: Skin is warm and dry.     Findings: No rash.  Neurological:     Mental Status: He is alert and oriented to person, place, and time.     Cranial Nerves: No cranial nerve deficit.     Motor: No abnormal muscle tone.     Coordination: Coordination normal.     Gait: Gait normal.     Deep Tendon Reflexes: Reflexes are normal and symmetric.  Psychiatric:        Behavior: Behavior normal.        Thought Content: Thought content normal.        Judgment: Judgment normal.   prostate 1+  Lab Results  Component Value Date   WBC 3.9 (L) 03/20/2017   HGB 13.7 03/20/2017   HCT 39.7 03/20/2017   PLT 262.0 03/20/2017   GLUCOSE 123 (H) 03/20/2017   CHOL 144 03/20/2017   TRIG 103.0 03/20/2017   HDL 51.10 03/20/2017   LDLCALC 72 03/20/2017   ALT 31 03/20/2017   AST 42 (H) 03/20/2017   NA 143 03/20/2017   K 3.7 03/20/2017   CL 105 03/20/2017   CREATININE 1.16 03/20/2017   BUN 15 03/20/2017   CO2 25 03/20/2017   TSH 2.27 03/20/2017   PSA 2.19 03/20/2017   HGBA1C 6.4 03/20/2017    No results found.  Assessment & Plan:   There are no diagnoses linked to this encounter.   No orders of the defined types were placed in this encounter.    Follow-up: No follow-ups on file.  Walker Kehr, MD

## 2018-03-26 NOTE — Assessment & Plan Note (Signed)
Labs

## 2018-03-26 NOTE — Assessment & Plan Note (Signed)
Amlodipine, Losartan Call if BP is high

## 2018-03-26 NOTE — Assessment & Plan Note (Signed)
Potential benefits of a long term benzodiazepines  use as well as potential risks  and complications were explained to the patient and were aknowledged. 

## 2018-07-16 ENCOUNTER — Ambulatory Visit (INDEPENDENT_AMBULATORY_CARE_PROVIDER_SITE_OTHER): Payer: Medicare Other | Admitting: Internal Medicine

## 2018-07-16 ENCOUNTER — Other Ambulatory Visit: Payer: Self-pay

## 2018-07-16 ENCOUNTER — Encounter: Payer: Self-pay | Admitting: Internal Medicine

## 2018-07-16 VITALS — BP 130/82 | HR 58 | Temp 97.6°F | Ht 71.0 in | Wt 223.0 lb

## 2018-07-16 DIAGNOSIS — I10 Essential (primary) hypertension: Secondary | ICD-10-CM

## 2018-07-16 DIAGNOSIS — R42 Dizziness and giddiness: Secondary | ICD-10-CM | POA: Diagnosis not present

## 2018-07-16 DIAGNOSIS — R600 Localized edema: Secondary | ICD-10-CM | POA: Diagnosis not present

## 2018-07-16 MED ORDER — METOPROLOL SUCCINATE ER 100 MG PO TB24: 100.0000 mg | ORAL_TABLET | Freq: Every day | ORAL | 3 refills | Status: DC

## 2018-07-16 MED ORDER — TAMSULOSIN HCL 0.4 MG PO CAPS: 0.4000 mg | ORAL_CAPSULE | Freq: Every day | ORAL | 3 refills | Status: DC

## 2018-07-16 NOTE — Patient Instructions (Signed)
Take Tamsulosin and Metoprolol at night Drink more water

## 2018-07-16 NOTE — Progress Notes (Signed)
Subjective:  Patient ID: Robert [name], male    DOB: 1950/04/05  Age: 68 y.o. MRN: [id number]  CC: No chief complaint on file.   HPI Robert [name] presents for dizzy spells - lightheaded off and on x 2-3 weeks No LOC  Outpatient Medications Prior to Visit  Medication Sig Dispense Refill  . ALPRAZolam (XANAX) 0.5 MG tablet Take 1 tablet (0.5 mg total) by mouth 2 (two) times daily as needed for anxiety. 180 tablet 1  . amLODipine (NORVASC) 10 MG tablet TAKE 1 TABLET(10 MG) BY MOUTH DAILY 90 tablet 3  . Ascorbic Acid (VITAMIN C) 100 MG tablet Take 100 mg by mouth daily.    Marland Kitchen aspirin 81 MG tablet Take 81 mg by mouth daily.    . Cholecalciferol (VITAMIN D3) 2000 units capsule Take 1 capsule (2,000 Units total) by mouth daily. 100 capsule 3  . gemfibrozil (LOPID) 600 MG tablet TAKE 1 TABLET BY MOUTH TWICE DAILY BEFORE A MEAL 180 tablet 3  . KRILL OIL PO Take by mouth.    . levocetirizine (XYZAL) 5 MG tablet Take 1 tablet (5 mg total) by mouth every evening. 90 tablet 3  . losartan (COZAAR) 100 MG tablet Take 1 tablet (100 mg total) by mouth daily. 90 tablet 3  . metoprolol succinate (TOPROL-XL) 100 MG 24 hr tablet Take 1 tablet (100 mg total) by mouth daily. Take with or immediately following a meal. 90 tablet 3  . Omega-3 Fatty Acids (FISH OIL) 1000 MG CPDR Take by mouth daily.    Marland Kitchen omeprazole (PRILOSEC) 20 MG capsule TAKE 1 CAPSULE(20 MG) BY MOUTH TWICE DAILY BEFORE A MEAL 180 capsule 3  . POTASSIUM PO Take by mouth.    . sildenafil (VIAGRA) 100 MG tablet Take as directed 30 tablet 2  . tamsulosin (FLOMAX) 0.4 MG CAPS capsule Take 1 capsule (0.4 mg total) by mouth daily. 90 capsule 3  . ALPRAZolam (XANAX) 0.5 MG tablet TAKE 1 TABLET BY MOUTH TWICE DAILY AS NEEDED FOR ANXIETY (Patient not taking: Reported on 07/16/2018) 180 tablet 1   No facility-administered medications prior to visit.     ROS: Review of Systems  Constitutional: Negative for appetite change, fatigue and unexpected weight  change.  HENT: Negative for congestion, nosebleeds, sneezing, sore throat and trouble swallowing.   Eyes: Negative for itching and visual disturbance.  Respiratory: Negative for cough.   Cardiovascular: Negative for chest pain, palpitations and leg swelling.  Gastrointestinal: Negative for abdominal distention, blood in stool, diarrhea and nausea.  Genitourinary: Negative for frequency and hematuria.  Musculoskeletal: Negative for back pain, gait problem, joint swelling and neck pain.  Skin: Negative for rash.  Neurological: Positive for dizziness and light-headedness. Negative for tremors, syncope, speech difficulty and weakness.  Psychiatric/Behavioral: Negative for agitation, dysphoric mood and sleep disturbance. The patient is not nervous/anxious.     Objective:  BP (!) 142/82 (BP Location: Left Arm, Patient Position: Sitting, Cuff Size: Large)   Pulse (!) 58   Temp 97.6 F (36.4 C) (Oral)   Ht 5\' 11"  (1.803 m)   Wt 223 lb (101.2 kg)   SpO2 98%   BMI 31.10 kg/m   BP Readings from Last 3 Encounters:  07/16/18 (!) 142/82  03/26/18 (!) 152/92  07/17/17 106/61    Wt Readings from Last 3 Encounters:  07/16/18 223 lb (101.2 kg)  03/26/18 226 lb (102.5 kg)  07/17/17 224 lb (101.6 kg)    Physical Exam Constitutional:      General: He  is not in acute distress.    Appearance: He is well-developed.     Comments: NAD  Eyes:     Conjunctiva/sclera: Conjunctivae normal.     Pupils: Pupils are equal, round, and reactive to light.  Neck:     Musculoskeletal: Normal range of motion.     Thyroid: No thyromegaly.     Vascular: No JVD.  Cardiovascular:     Rate and Rhythm: Normal rate and regular rhythm.     Heart sounds: Normal heart sounds. No murmur. No friction rub. No gallop.   Pulmonary:     Effort: Pulmonary effort is normal. No respiratory distress.     Breath sounds: Normal breath sounds. No wheezing or rales.  Chest:     Chest wall: No tenderness.  Abdominal:      General: Bowel sounds are normal. There is no distension.     Palpations: Abdomen is soft. There is no mass.     Tenderness: There is no abdominal tenderness. There is no guarding or rebound.  Musculoskeletal: Normal range of motion.        General: No tenderness.  Lymphadenopathy:     Cervical: No cervical adenopathy.  Skin:    General: Skin is warm and dry.     Findings: No rash.  Neurological:     Mental Status: He is alert and oriented to person, place, and time.     Cranial Nerves: No cranial nerve deficit.     Motor: No abnormal muscle tone.     Coordination: Coordination normal.     Gait: Gait normal.     Deep Tendon Reflexes: Reflexes are normal and symmetric.  Psychiatric:        Behavior: Behavior normal.        Thought Content: Thought content normal.        Judgment: Judgment normal.    Procedure: EKG Indication: lighteheaded Impression: NSR. No acute changes.  Lab Results  Component Value Date   WBC 4.1 03/26/2018   HGB 13.9 03/26/2018   HCT 40.4 03/26/2018   PLT 285.0 03/26/2018   GLUCOSE 105 (H) 03/26/2018   CHOL 166 03/26/2018   TRIG 124.0 03/26/2018   HDL 47.50 03/26/2018   LDLCALC 94 03/26/2018   ALT 35 03/26/2018   AST 45 (H) 03/26/2018   NA 141 03/26/2018   K 3.9 03/26/2018   CL 104 03/26/2018   CREATININE 1.20 03/26/2018   BUN 16 03/26/2018   CO2 29 03/26/2018   TSH 2.01 03/26/2018   PSA 1.31 03/26/2018   HGBA1C 6.5 03/26/2018    No results found.  Assessment & Plan:   There are no diagnoses linked to this encounter.   No orders of the defined types were placed in this encounter.    Follow-up: No follow-ups on file.  Walker Kehr, MD

## 2018-07-16 NOTE — Assessment & Plan Note (Addendum)
?  etiology: Mild ortho BP drop Take Toprol, Flomax at HS EKG Labs RTC 2-3 wks Hydrate well

## 2018-07-16 NOTE — Assessment & Plan Note (Signed)
No edema

## 2018-07-16 NOTE — Assessment & Plan Note (Signed)
BP Readings from Last 3 Encounters:  07/16/18 (!) 142/82  03/26/18 (!) 152/92  07/17/17 106/61

## 2018-08-27 ENCOUNTER — Ambulatory Visit: Payer: Medicare Other | Admitting: Internal Medicine

## 2018-10-01 ENCOUNTER — Other Ambulatory Visit: Payer: Self-pay | Admitting: Internal Medicine

## 2019-01-30 ENCOUNTER — Ambulatory Visit: Payer: Medicare Other

## 2019-02-07 ENCOUNTER — Ambulatory Visit: Payer: Medicare Other

## 2019-02-20 ENCOUNTER — Ambulatory Visit: Payer: Medicare Other

## 2019-04-04 ENCOUNTER — Other Ambulatory Visit: Payer: Self-pay | Admitting: Internal Medicine

## 2019-04-18 ENCOUNTER — Other Ambulatory Visit: Payer: Self-pay | Admitting: Internal Medicine

## 2019-04-20 ENCOUNTER — Other Ambulatory Visit: Payer: Self-pay | Admitting: Internal Medicine

## 2019-05-06 ENCOUNTER — Ambulatory Visit (INDEPENDENT_AMBULATORY_CARE_PROVIDER_SITE_OTHER): Payer: Medicare PPO | Admitting: Internal Medicine

## 2019-05-06 ENCOUNTER — Encounter: Payer: Self-pay | Admitting: Internal Medicine

## 2019-05-06 ENCOUNTER — Other Ambulatory Visit: Payer: Self-pay

## 2019-05-06 VITALS — BP 142/82 | HR 56 | Temp 98.0°F | Ht 71.0 in | Wt 227.0 lb

## 2019-05-06 DIAGNOSIS — N289 Disorder of kidney and ureter, unspecified: Secondary | ICD-10-CM

## 2019-05-06 DIAGNOSIS — Z0000 Encounter for general adult medical examination without abnormal findings: Secondary | ICD-10-CM

## 2019-05-06 DIAGNOSIS — D126 Benign neoplasm of colon, unspecified: Secondary | ICD-10-CM | POA: Diagnosis not present

## 2019-05-06 DIAGNOSIS — F419 Anxiety disorder, unspecified: Secondary | ICD-10-CM | POA: Diagnosis not present

## 2019-05-06 DIAGNOSIS — Z125 Encounter for screening for malignant neoplasm of prostate: Secondary | ICD-10-CM

## 2019-05-06 DIAGNOSIS — K635 Polyp of colon: Secondary | ICD-10-CM | POA: Insufficient documentation

## 2019-05-06 DIAGNOSIS — I10 Essential (primary) hypertension: Secondary | ICD-10-CM | POA: Diagnosis not present

## 2019-05-06 LAB — HEPATIC FUNCTION PANEL
ALT: 32 U/L (ref 0–53)
AST: 41 U/L — ABNORMAL HIGH (ref 0–37)
Albumin: 4.6 g/dL (ref 3.5–5.2)
Alkaline Phosphatase: 66 U/L (ref 39–117)
Bilirubin, Direct: 0.1 mg/dL (ref 0.0–0.3)
Total Bilirubin: 0.6 mg/dL (ref 0.2–1.2)
Total Protein: 7.4 g/dL (ref 6.0–8.3)

## 2019-05-06 LAB — LIPID PANEL
Cholesterol: 154 mg/dL (ref 0–200)
HDL: 43.3 mg/dL (ref >39.00)
LDL Cholesterol: 84 mg/dL (ref 0–99)
NonHDL: 110.8
Total CHOL/HDL Ratio: 4
Triglycerides: 132 mg/dL (ref 0.0–149.0)
VLDL: 26.4 mg/dL (ref 0.0–40.0)

## 2019-05-06 LAB — CBC WITH DIFFERENTIAL/PLATELET
Basophils Absolute: 0 10*3/uL (ref 0.0–0.1)
Basophils Relative: 1.2 % (ref 0.0–3.0)
Eosinophils Absolute: 0.3 10*3/uL (ref 0.0–0.7)
Eosinophils Relative: 8.6 % — ABNORMAL HIGH (ref 0.0–5.0)
HCT: 39.5 % (ref 39.0–52.0)
Hemoglobin: 13.3 g/dL (ref 13.0–17.0)
Lymphocytes Relative: 39.4 % (ref 12.0–46.0)
Lymphs Abs: 1.5 10*3/uL (ref 0.7–4.0)
MCHC: 33.7 g/dL (ref 30.0–36.0)
MCV: 83.5 fl (ref 78.0–100.0)
Monocytes Absolute: 0.3 10*3/uL (ref 0.1–1.0)
Monocytes Relative: 9 % (ref 3.0–12.0)
Neutro Abs: 1.6 10*3/uL (ref 1.4–7.7)
Neutrophils Relative %: 41.8 % — ABNORMAL LOW (ref 43.0–77.0)
Platelets: 262 10*3/uL (ref 150.0–400.0)
RBC: 4.74 Mil/uL (ref 4.22–5.81)
RDW: 14 % (ref 11.5–15.5)
WBC: 3.8 10*3/uL — ABNORMAL LOW (ref 4.0–10.5)

## 2019-05-06 LAB — BASIC METABOLIC PANEL
BUN: 18 mg/dL (ref 6–23)
CO2: 29 mEq/L (ref 19–32)
Calcium: 9.8 mg/dL (ref 8.4–10.5)
Chloride: 105 mEq/L (ref 96–112)
Creatinine, Ser: 1.27 mg/dL (ref 0.40–1.50)
GFR: 67.97 mL/min (ref >60.00)
Glucose, Bld: 102 mg/dL — ABNORMAL HIGH (ref 70–99)
Potassium: 4.2 mEq/L (ref 3.5–5.1)
Sodium: 138 mEq/L (ref 135–145)

## 2019-05-06 LAB — URINALYSIS
Bilirubin Urine: NEGATIVE
Hgb urine dipstick: NEGATIVE
Ketones, ur: NEGATIVE
Leukocytes,Ua: NEGATIVE
Nitrite: NEGATIVE
Specific Gravity, Urine: 1.02 (ref 1.000–1.030)
Total Protein, Urine: NEGATIVE
Urine Glucose: NEGATIVE
Urobilinogen, UA: 0.2 (ref 0.0–1.0)
pH: 6.5 (ref 5.0–8.0)

## 2019-05-06 LAB — PSA: PSA: 1.43 ng/mL (ref 0.10–4.00)

## 2019-05-06 LAB — TSH: TSH: 1.86 u[IU]/mL (ref 0.35–4.50)

## 2019-05-06 MED ORDER — TAMSULOSIN HCL 0.4 MG PO CAPS: 0.4000 mg | ORAL_CAPSULE | Freq: Every day | ORAL | 3 refills | Status: DC

## 2019-05-06 MED ORDER — GEMFIBROZIL 600 MG PO TABS: ORAL_TABLET | ORAL | 3 refills | Status: DC

## 2019-05-06 MED ORDER — OMEPRAZOLE 20 MG PO CPDR: 20.0000 mg | DELAYED_RELEASE_CAPSULE | Freq: Two times a day (BID) | ORAL | 3 refills | Status: DC

## 2019-05-06 MED ORDER — LEVOCETIRIZINE DIHYDROCHLORIDE 5 MG PO TABS: 5.0000 mg | ORAL_TABLET | Freq: Every evening | ORAL | 3 refills | Status: DC

## 2019-05-06 MED ORDER — SHINGRIX 50 MCG/0.5ML IM SUSR: 0.5000 mL | Freq: Once | INTRAMUSCULAR | 1 refills | Status: AC

## 2019-05-06 MED ORDER — SILDENAFIL CITRATE 100 MG PO TABS: ORAL_TABLET | ORAL | 3 refills | Status: DC

## 2019-05-06 MED ORDER — METOPROLOL SUCCINATE ER 100 MG PO TB24: 100.0000 mg | ORAL_TABLET | Freq: Every day | ORAL | 3 refills | Status: DC

## 2019-05-06 MED ORDER — LOSARTAN POTASSIUM 100 MG PO TABS: 100.0000 mg | ORAL_TABLET | Freq: Every day | ORAL | 3 refills | Status: DC

## 2019-05-06 NOTE — Assessment & Plan Note (Signed)
Labs

## 2019-05-06 NOTE — Assessment & Plan Note (Signed)
Dr Carlean Purl Colon due in 2024

## 2019-05-06 NOTE — Addendum Note (Signed)
Addended by: Cresenciano Lick on: 05/06/2019 11:44 AM   Modules accepted: Orders

## 2019-05-06 NOTE — Assessment & Plan Note (Signed)
Alprazolam prn  Potential benefits of a long term benzodiazepines  use as well as potential risks  and complications were explained to the patient and were aknowledged. 

## 2019-05-06 NOTE — Assessment & Plan Note (Signed)
  We discussed age appropriate health related issues, including available/recomended screening tests and vaccinations. Labs were ordered to be later reviewed . All questions were answered. We discussed one or more of the following - seat belt use, use of sunscreen/sun exposure exercise, safe sex, fall risk reduction, second hand smoke exposure, firearm use and storage, seat belt use, a need for adhering to healthy diet and exercise. Labs were ordered. All questions were answered. Shingrix Rx Colonoscopy due in 2024

## 2019-05-06 NOTE — Assessment & Plan Note (Signed)
Amlodipine, Losartan 

## 2019-05-06 NOTE — Progress Notes (Signed)
Subjective:  Patient ID: Robert [name], male    DOB: Sep 08, 1950  Age: 69 y.o. MRN: [id number]  CC: No chief complaint on file.   HPI Yaroslav Buel presents for a well exam F/u HTN, colon polyps  Outpatient Medications Prior to Visit  Medication Sig Dispense Refill  . ALPRAZolam (XANAX) 0.5 MG tablet Take 1 tablet (0.5 mg total) by mouth 2 (two) times daily as needed for anxiety. 180 tablet 1  . amLODipine (NORVASC) 10 MG tablet TAKE 1 TABLET(10 MG) BY MOUTH DAILY 90 tablet 3  . Ascorbic Acid (VITAMIN C) 100 MG tablet Take 100 mg by mouth daily.    Marland Kitchen aspirin 81 MG tablet Take 81 mg by mouth daily.    . Cholecalciferol (VITAMIN D3) 2000 units capsule Take 1 capsule (2,000 Units total) by mouth daily. 100 capsule 3  . gemfibrozil (LOPID) 600 MG tablet TAKE 1 TABLET BY MOUTH TWICE DAILY BEFORE A MEAL 180 tablet 3  . KRILL OIL PO Take by mouth.    . levocetirizine (XYZAL) 5 MG tablet Take 1 tablet (5 mg total) by mouth every evening. Office visit needed before refills will be given 30 tablet 0  . losartan (COZAAR) 100 MG tablet Take 1 tablet (100 mg total) by mouth daily. 90 tablet 3  . metoprolol succinate (TOPROL-XL) 100 MG 24 hr tablet Take 1 tablet (100 mg total) by mouth at bedtime. Take with or immediately following a meal. 90 tablet 3  . Omega-3 Fatty Acids (FISH OIL) 1000 MG CPDR Take by mouth daily.    Marland Kitchen omeprazole (PRILOSEC) 20 MG capsule TAKE 1 CAPSULE(20 MG) BY MOUTH TWICE DAILY BEFORE A MEAL 180 capsule 3  . POTASSIUM PO Take by mouth.    . sildenafil (VIAGRA) 100 MG tablet Take as directed     **OFFICE VISIT DUE** 30 tablet 0  . tamsulosin (FLOMAX) 0.4 MG CAPS capsule Take 1 capsule (0.4 mg total) by mouth daily after supper. 90 capsule 3   No facility-administered medications prior to visit.    ROS: Review of Systems  Constitutional: Negative for appetite change, fatigue and unexpected weight change.  HENT: Negative for congestion, nosebleeds, sneezing, sore throat and  trouble swallowing.   Eyes: Negative for itching and visual disturbance.  Respiratory: Negative for cough.   Cardiovascular: Negative for chest pain, palpitations and leg swelling.  Gastrointestinal: Negative for abdominal distention, blood in stool, diarrhea and nausea.  Genitourinary: Negative for frequency and hematuria.  Musculoskeletal: Negative for back pain, gait problem, joint swelling and neck pain.  Skin: Negative for rash.  Neurological: Negative for dizziness, tremors, speech difficulty and weakness.  Psychiatric/Behavioral: Negative for agitation, dysphoric mood, sleep disturbance and suicidal ideas. The patient is not nervous/anxious.     Objective:  There were no vitals taken for this visit.  BP Readings from Last 3 Encounters:  07/16/18 130/82  03/26/18 (!) 152/92  07/17/17 106/61    Wt Readings from Last 3 Encounters:  07/16/18 223 lb (101.2 kg)  03/26/18 226 lb (102.5 kg)  07/17/17 224 lb (101.6 kg)    Physical Exam Constitutional:      General: He is not in acute distress.    Appearance: He is well-developed.     Comments: NAD  Eyes:     Conjunctiva/sclera: Conjunctivae normal.     Pupils: Pupils are equal, round, and reactive to light.  Neck:     Thyroid: No thyromegaly.     Vascular: No JVD.  Cardiovascular:  Rate and Rhythm: Normal rate and regular rhythm.     Heart sounds: Normal heart sounds. No murmur. No friction rub. No gallop.   Pulmonary:     Effort: Pulmonary effort is normal. No respiratory distress.     Breath sounds: Normal breath sounds. No wheezing or rales.  Chest:     Chest wall: No tenderness.  Abdominal:     General: Bowel sounds are normal. There is no distension.     Palpations: Abdomen is soft. There is no mass.     Tenderness: There is no abdominal tenderness. There is no guarding or rebound.  Genitourinary:    Rectum: Normal. Guaiac result negative.  Musculoskeletal:        General: No tenderness. Normal range of  motion.     Cervical back: Normal range of motion.  Lymphadenopathy:     Cervical: No cervical adenopathy.  Skin:    General: Skin is warm and dry.     Findings: No rash.  Neurological:     Mental Status: He is alert and oriented to person, place, and time.     Cranial Nerves: No cranial nerve deficit.     Motor: No abnormal muscle tone.     Coordination: Coordination normal.     Gait: Gait normal.     Deep Tendon Reflexes: Reflexes are normal and symmetric.  Psychiatric:        Behavior: Behavior normal.        Thought Content: Thought content normal.        Judgment: Judgment normal.   prostate 1+  Lab Results  Component Value Date   WBC 4.1 03/26/2018   HGB 13.9 03/26/2018   HCT 40.4 03/26/2018   PLT 285.0 03/26/2018   GLUCOSE 105 (H) 03/26/2018   CHOL 166 03/26/2018   TRIG 124.0 03/26/2018   HDL 47.50 03/26/2018   LDLCALC 94 03/26/2018   ALT 35 03/26/2018   AST 45 (H) 03/26/2018   NA 141 03/26/2018   K 3.9 03/26/2018   CL 104 03/26/2018   CREATININE 1.20 03/26/2018   BUN 16 03/26/2018   CO2 29 03/26/2018   TSH 2.01 03/26/2018   PSA 1.31 03/26/2018   HGBA1C 6.5 03/26/2018    No results found.  Assessment & Plan:   There are no diagnoses linked to this encounter.   No orders of the defined types were placed in this encounter.    Follow-up: No follow-ups on file.  Walker Kehr, MD

## 2019-06-25 ENCOUNTER — Telehealth: Payer: Self-pay

## 2019-06-25 NOTE — Telephone Encounter (Signed)
New message    The patient voiced C/o cramps in both legs.   The patient is aware the MD is not in the office until next week, offer an appt to see another MD / NP patient declined wanted to wait for MD  The patient aware a messages will be sent around for review.

## 2019-07-05 ENCOUNTER — Other Ambulatory Visit: Payer: Self-pay | Admitting: Internal Medicine

## 2019-07-29 DIAGNOSIS — M7021 Olecranon bursitis, right elbow: Secondary | ICD-10-CM | POA: Diagnosis not present

## 2019-07-29 DIAGNOSIS — M25521 Pain in right elbow: Secondary | ICD-10-CM | POA: Diagnosis not present

## 2019-07-31 ENCOUNTER — Ambulatory Visit: Payer: Medicare PPO

## 2019-08-03 ENCOUNTER — Ambulatory Visit: Payer: Medicare PPO

## 2019-08-05 ENCOUNTER — Telehealth: Payer: Self-pay

## 2019-08-05 NOTE — Telephone Encounter (Signed)
Pt states he is having some dizziness especially when out in the sun.  BP reported 106/66 102/63  Pt does not want to go to hospital but wants to know should he be concerned about blood pressure reading. Please advise

## 2019-08-06 MED ORDER — METOPROLOL SUCCINATE ER 100 MG PO TB24: 50.0000 mg | ORAL_TABLET | Freq: Every day | ORAL | 3 refills | Status: DC

## 2019-08-06 NOTE — Telephone Encounter (Signed)
Please asked the patient to reduce metoprolol to 1/2 tablet a day. Thanks

## 2019-08-06 NOTE — Telephone Encounter (Signed)
Left pt a detailed message of below  

## 2019-08-10 ENCOUNTER — Telehealth: Payer: Medicare PPO | Admitting: Internal Medicine

## 2019-09-02 ENCOUNTER — Telehealth: Payer: Self-pay | Admitting: Internal Medicine

## 2019-09-02 NOTE — Telephone Encounter (Signed)
OK Cialis Thx

## 2019-09-02 NOTE — Telephone Encounter (Signed)
New Message:   Pt is calling and would like to know if his sildenafil (VIAGRA) 100 MG tablet can be changed to something else. I have informed the pt that the Dr is out of the office until Tuesday 09/08/19. Please advise.

## 2019-09-03 MED ORDER — TADALAFIL 20 MG PO TABS
20.0000 mg | ORAL_TABLET | Freq: Every day | ORAL | 3 refills | Status: DC | PRN
Start: 2019-09-03 — End: 2020-05-24

## 2019-09-03 NOTE — Telephone Encounter (Signed)
Unable to reach pt and unable to leave vm

## 2019-09-08 NOTE — Telephone Encounter (Signed)
Left pt a vm informing him of the medication change

## 2019-09-27 ENCOUNTER — Other Ambulatory Visit: Payer: Self-pay | Admitting: Internal Medicine

## 2019-11-13 ENCOUNTER — Other Ambulatory Visit: Payer: Self-pay | Admitting: Internal Medicine

## 2019-11-13 DIAGNOSIS — R0683 Snoring: Secondary | ICD-10-CM

## 2019-12-14 ENCOUNTER — Other Ambulatory Visit: Payer: Self-pay

## 2019-12-14 ENCOUNTER — Encounter: Payer: Self-pay | Admitting: Podiatry

## 2019-12-14 ENCOUNTER — Ambulatory Visit: Payer: Medicare PPO | Admitting: Podiatry

## 2019-12-14 ENCOUNTER — Ambulatory Visit (INDEPENDENT_AMBULATORY_CARE_PROVIDER_SITE_OTHER): Payer: Medicare PPO

## 2019-12-14 DIAGNOSIS — M79671 Pain in right foot: Secondary | ICD-10-CM

## 2019-12-14 DIAGNOSIS — M79672 Pain in left foot: Secondary | ICD-10-CM

## 2019-12-14 DIAGNOSIS — M21619 Bunion of unspecified foot: Secondary | ICD-10-CM | POA: Diagnosis not present

## 2019-12-14 DIAGNOSIS — M79674 Pain in right toe(s): Secondary | ICD-10-CM | POA: Diagnosis not present

## 2019-12-14 DIAGNOSIS — B351 Tinea unguium: Secondary | ICD-10-CM

## 2019-12-14 DIAGNOSIS — M79675 Pain in left toe(s): Secondary | ICD-10-CM

## 2019-12-16 ENCOUNTER — Other Ambulatory Visit: Payer: Self-pay | Admitting: Podiatry

## 2019-12-16 DIAGNOSIS — B351 Tinea unguium: Secondary | ICD-10-CM

## 2019-12-16 DIAGNOSIS — M79674 Pain in right toe(s): Secondary | ICD-10-CM

## 2019-12-16 NOTE — Progress Notes (Signed)
Subjective:   Patient ID: Robert [name], male   DOB: 69 y.o.   MRN: [id number]   HPI Patient states he gets a low-grade burning in both his feet especially at night he has structural bunion deformity bilateral and his nails are thickened and he cannot cut them and they get incurvated in the corners and sore.  Patient does not smoke currently and would like to be more active   Review of Systems  All other systems reviewed and are negative.       Objective:  Physical Exam Vitals and nursing note reviewed.  Constitutional:      Appearance: He is well-developed and well-nourished.  Cardiovascular:     Pulses: Intact distal pulses.  Pulmonary:     Effort: Pulmonary effort is normal.  Musculoskeletal:        General: Normal range of motion.  Skin:    General: Skin is warm.  Neurological:     Mental Status: He is alert.     Neurovascular status was found to be intact muscle strength was found to be adequate.  Patient is found to have incurvated nailbeds with thickness 1-5 both feet with irritation of the toes with digital deformities and structural bunion deformity.  Patient does have history of back problems which is probably contributory towards his tingling and burning that he experiences      Assessment:  Patient who does have significant back issues with mycotic nail infection 1-5 both feet structural deformity bilateral     Plan:  H&P reviewed condition and recommended debridement of the nailbeds today and wider shoes with cushion materials.  Do not recommend medicine but did discuss gabapentin or possible Lyrica but will try to hold off and patient is encouraged to try to be active and do daily foot inspections  X-rays indicate that there is structural bunion deformity bilateral with moderate osteoporosis and digital contracture

## 2020-01-27 ENCOUNTER — Encounter: Payer: Self-pay | Admitting: Pulmonary Disease

## 2020-01-27 ENCOUNTER — Ambulatory Visit (INDEPENDENT_AMBULATORY_CARE_PROVIDER_SITE_OTHER): Payer: Medicare PPO | Admitting: Pulmonary Disease

## 2020-01-27 ENCOUNTER — Other Ambulatory Visit: Payer: Self-pay

## 2020-01-27 VITALS — BP 140/80 | HR 62 | Temp 97.8°F | Ht 71.0 in | Wt 228.0 lb

## 2020-01-27 DIAGNOSIS — R0683 Snoring: Secondary | ICD-10-CM | POA: Diagnosis not present

## 2020-01-27 NOTE — Patient Instructions (Signed)
Will arrange for home sleep study Will call to arrange for follow up after sleep study reviewed  

## 2020-01-27 NOTE — Progress Notes (Signed)
[facility]  Chief Complaint  Patient presents with  . Consult    Sleep consult    Constitutional:  BP 140/80 (BP Location: Left Arm, Cuff Size: Normal)   Pulse 62   Temp 97.8 F (36.6 C) (Other (Comment)) Comment (Src): wrist  Ht 5\' 11"  (1.803 m)   Wt 228 lb (103.4 kg)   SpO2 100% Comment: Room air  BMI 31.80 kg/m   Past Medical History:  Allergy, Anxiety, ED, GERD, Colon polyps, HLD, HTN, IBS  Past Surgical History:  He  has a past surgical history that includes Appendectomy; Vasectomy; Colon surgery (07/2009); and Colonoscopy.  Brief Summary:  Robert [name] is a 70 y.o. male smoker with snoring.      Subjective:   He has noticed trouble with his sleep for a while.  He snores and will wake up feeling pressure in his throat and chest.  He dreams a lot.  He is a restless sleeper.  He goes to sleep at 11 pm.  He falls asleep in 15 to 20 minutes.  He wakes up some times to use the bathroom.  He gets out of bed at 6 am.  He feels tired in the morning.  He denies morning headache.  He does not use anything to help him stay awake.  He occasionally uses an OTC sleep aide.  He denies sleep walking, sleep talking, bruxism, or nightmares.  There is no history of restless legs.  He denies sleep hallucinations, sleep paralysis, or cataplexy.  The Epworth score is 12 out of 24.    Physical Exam:   Appearance - well kempt   ENMT - no sinus tenderness, no oral exudate, no LAN, Mallampati 4 airway, no stridor  Respiratory - equal breath sounds bilaterally, no wheezing or rales  CV - s1s2 regular rate and rhythm, no murmurs  Ext - no clubbing, no edema  Skin - no rashes  Psych - normal mood and affect   Sleep Tests:    Social History:  He  reports that he has been smoking cigars. He has never used smokeless tobacco. He reports current alcohol use. He reports that he does not use drugs.  Family History:  His family history  includes Colon cancer in his cousin.    Discussion:  He has snoring, sleep disruption, apnea, and daytime sleepiness.  He has history of hypertension.  I am concerned he could have obstructive sleep apnea.  Assessment/Plan:   Snoring with excessive daytime sleepiness. - will need to arrange for a home sleep study  Obesity. - discussed how weight can impact sleep and risk for sleep disordered breathing - discussed options to assist with weight loss: combination of diet modification, cardiovascular and strength training exercises  Cardiovascular risk. - had an extensive discussion regarding the adverse health consequences related to untreated sleep disordered breathing - specifically discussed the risks for hypertension, coronary artery disease, cardiac dysrhythmias, cerebrovascular disease, and diabetes - lifestyle modification discussed  Safe driving practices. - discussed how sleep disruption can increase risk of accidents, particularly when driving - safe driving practices were discussed  Therapies for obstructive sleep apnea. - if the sleep study shows significant sleep apnea, then various therapies for treatment were reviewed: CPAP, oral appliance, and surgical interventions   Time Spent Involved in Patient Care on Day of Examination:  33 minutes  Follow up:  Patient Instructions  Will arrange for home sleep study Will call to arrange for follow up after  sleep study reviewed    Medication List:   Allergies as of 01/27/2020      Reactions   Atorvastatin    REACTION: INTOL to Stains w/ myalgias      Medication List       Accurate as of January 27, 2020 11:43 AM. If you have any questions, ask your nurse or doctor.        STOP taking these medications   Fish Oil 1000 MG Cpdr Stopped by: Chesley Mires, MD   levocetirizine 5 MG tablet Commonly known as: XYZAL Stopped by: Chesley Mires, MD   POTASSIUM PO Stopped by: Chesley Mires, MD     TAKE these medications    ALPRAZolam 0.5 MG tablet Commonly known as: XANAX Take 1 tablet (0.5 mg total) by mouth 2 (two) times daily as needed for anxiety.   amLODipine 10 MG tablet Commonly known as: NORVASC TAKE 1 TABLET(10 MG) BY MOUTH DAILY   aspirin 81 MG tablet Take 81 mg by mouth daily.   gemfibrozil 600 MG tablet Commonly known as: LOPID 1 po bid   KRILL OIL PO Take by mouth.   losartan 100 MG tablet Commonly known as: COZAAR Take 1 tablet (100 mg total) by mouth daily.   metoprolol succinate 100 MG 24 hr tablet Commonly known as: TOPROL-XL Take 0.5 tablets (50 mg total) by mouth at bedtime. Take with or immediately following a meal.   omeprazole 20 MG capsule Commonly known as: PRILOSEC Take 1 capsule (20 mg total) by mouth 2 (two) times daily before a meal.   tadalafil 20 MG tablet Commonly known as: Cialis Take 1 tablet (20 mg total) by mouth daily as needed for erectile dysfunction.   tamsulosin 0.4 MG Caps capsule Commonly known as: FLOMAX Take 1 capsule (0.4 mg total) by mouth daily after supper.   vitamin C 100 MG tablet Take 100 mg by mouth daily.   Vitamin D3 50 MCG (2000 UT) capsule Take 1 capsule (2,000 Units total) by mouth daily.       Signature:  Chesley Mires, MD [place] Pager - [phone] 01/27/2020, 11:43 AM

## 2020-02-02 ENCOUNTER — Encounter: Payer: Self-pay | Admitting: Pulmonary Disease

## 2020-02-03 ENCOUNTER — Telehealth: Payer: Self-pay | Admitting: Pulmonary Disease

## 2020-02-04 NOTE — Telephone Encounter (Signed)
I believe this was made in error

## 2020-02-05 ENCOUNTER — Other Ambulatory Visit: Payer: Self-pay

## 2020-02-05 ENCOUNTER — Ambulatory Visit: Payer: Medicare PPO

## 2020-02-05 DIAGNOSIS — R0683 Snoring: Secondary | ICD-10-CM

## 2020-02-05 DIAGNOSIS — G4733 Obstructive sleep apnea (adult) (pediatric): Secondary | ICD-10-CM | POA: Diagnosis not present

## 2020-02-23 ENCOUNTER — Telehealth: Payer: Self-pay | Admitting: Pulmonary Disease

## 2020-02-23 DIAGNOSIS — G4733 Obstructive sleep apnea (adult) (pediatric): Secondary | ICD-10-CM | POA: Diagnosis not present

## 2020-02-23 NOTE — Telephone Encounter (Signed)
HST 02/05/20 >> AHI 32.1, SpO2 low 67%   Please inform him that his sleep study shows severe obstructive sleep apnea.  Please arrange for ROV with me or NP to discuss treatment options.

## 2020-02-23 NOTE — Telephone Encounter (Signed)
Attempted to call pt but line went straight to VM. Left message for pt to return call. 

## 2020-02-24 NOTE — Telephone Encounter (Signed)
Called and spoke with patient to let him know results of HST. Patient is now scheduled for OV with Robert [name] to discuss results and options. Nothing further needed at this time.  Next Appt With Pulmonology Robert Ehrich, NP) 03/02/2020 at 9:00 AM

## 2020-02-24 NOTE — Telephone Encounter (Signed)
Pt returning a phone call. Pt can be reached at [phone].

## 2020-03-02 ENCOUNTER — Other Ambulatory Visit: Payer: Self-pay

## 2020-03-02 ENCOUNTER — Ambulatory Visit: Payer: Medicare PPO | Admitting: Primary Care

## 2020-03-02 ENCOUNTER — Encounter: Payer: Self-pay | Admitting: Primary Care

## 2020-03-02 VITALS — BP 124/78 | HR 66 | Temp 98.0°F | Ht 71.0 in | Wt 223.0 lb

## 2020-03-02 DIAGNOSIS — G4730 Sleep apnea, unspecified: Secondary | ICD-10-CM | POA: Diagnosis not present

## 2020-03-02 DIAGNOSIS — G4733 Obstructive sleep apnea (adult) (pediatric): Secondary | ICD-10-CM | POA: Insufficient documentation

## 2020-03-02 DIAGNOSIS — R0683 Snoring: Secondary | ICD-10-CM

## 2020-03-02 NOTE — Assessment & Plan Note (Addendum)
-   HST 02/05/20 >> AHI 32.1, SpO2 low 68% (baseline 93%) - Reviewed treatment options and risks of untreated sleep apnea at length with patient  - Recommend starting CPAP therapy auto titrate 5-20cm h20 - Encourage patient wear CPAP every night for 4-6 hours, advised not to drive if experiencing excessive daytime fatigue/solmonce. Maintain healthy weight.  - FU 31-90 days after receiving CPAP machine

## 2020-03-02 NOTE — Patient Instructions (Signed)
Home sleep study showed that you have severe obstructive sleep apnea, on average you had 32 events an hour where he stopped breathing for 10 seconds or longer.  Baseline oxygen was 93% with lowest O2 noted at 68%.  Treatment options include weight loss, side sleeping position, oral appliance, CPAP or referral to ENT for possible surgical options.  We recommend starting CPAP therapy due to severity of OSA  Orders New CPAP start 5 to 20 cm H2O; mask of choice  Follow-up 31 to 90 days after receiving CPAP machine (or 3 months)   Living With Sleep Apnea Sleep apnea is a condition in which breathing pauses or becomes shallow during sleep. Sleep apnea is most commonly caused by a collapsed or blocked airway. People with sleep apnea snore loudly and have times when they gasp and stop breathing for 10 seconds or more during sleep. This happens over and over during the night. This disrupts your sleep and keeps your body from getting the rest that it needs, which can cause tiredness and lack of energy (fatigue) during the day. The breaks in breathing also interrupt the deep sleep that you need to feel rested. Even if you do not completely wake up from the gaps in breathing, your sleep may not be restful. You may also have a headache in the morning and low energy during the day, and you may feel anxious or depressed. How can sleep apnea affect me? Sleep apnea increases your chances of extreme tiredness during the day (daytime fatigue). It can also increase your risk for health conditions, such as:  Heart attack.  Stroke.  Diabetes.  Heart failure.  Irregular heartbeat.  High blood pressure. If you have daytime fatigue as a result of sleep apnea, you may be more likely to:  Perform poorly at school or work.  Fall asleep while driving.  Have difficulty with attention.  Develop depression or anxiety.  Become severely overweight (obese).  Have sexual dysfunction. What actions can I take to  manage sleep apnea? Sleep apnea treatment  If you were given a device to open your airway while you sleep, use it only as told by your health care provider. You may be given: ? An oral appliance. This is a custom-made mouthpiece that shifts your lower jaw forward. ? A continuous positive airway pressure (CPAP) device. This device blows air through a mask when you breathe out (exhale). ? A nasal expiratory positive airway pressure (EPAP) device. This device has valves that you put into each nostril. ? A bi-level positive airway pressure (BPAP) device. This device blows air through a mask when you breathe in (inhale) and breathe out (exhale).  You may need surgery if other treatments do not work for you.   Sleep habits  Go to sleep and wake up at the same time every day. This helps set your internal clock (circadian rhythm) for sleeping. ? If you stay up later than usual, such as on weekends, try to get up in the morning within 2 hours of your normal wake time.  Try to get at least 7-9 hours of sleep each night.  Stop computer, tablet, and mobile phone use a few hours before bedtime.  Do not take long naps during the day. If you nap, limit it to 30 minutes.  Have a relaxing bedtime routine. Reading or listening to music may relax you and help you sleep.  Use your bedroom only for sleep. ? Keep your television and computer out of your bedroom. ?  Keep your bedroom cool, dark, and quiet. ? Use a supportive mattress and pillows.  Follow your health care provider's instructions for other changes to sleep habits. Nutrition  Do not eat heavy meals in the evening.  Do not have caffeine in the later part of the day. The effects of caffeine can last for more than 5 hours.  Follow your health care provider's or dietitian's instructions for any diet changes. Lifestyle  Do not drink alcohol before bedtime. Alcohol can cause you to fall asleep at first, but then it can cause you to wake up in  the middle of the night and have trouble getting back to sleep.  Do not use any products that contain nicotine or tobacco, such as cigarettes and e-cigarettes. If you need help quitting, ask your health care provider.      Medicines  Take over-the-counter and prescription medicines only as told by your health care provider.  Do not use over-the-counter sleep medicine. You can become dependent on this medicine, and it can make sleep apnea worse.  Do not use medicines, such as sedatives and narcotics, unless told by your health care provider. Activity  Exercise on most days, but avoid exercising in the evening. Exercising near bedtime can interfere with sleeping.  If possible, spend time outside every day. Natural light helps regulate your circadian rhythm. General information  Lose weight if you need to, and maintain a healthy weight.  Keep all follow-up visits as told by your health care provider. This is important.  If you are having surgery, make sure to tell your health care provider that you have sleep apnea. You may need to bring your device with you. Where to find more information Learn more about sleep apnea and daytime fatigue from:  American Sleep Association: sleepassociation.[place]: sleepfoundation.org  National Heart, Lung, and Blood Institute: https://www.[place].biz/ Summary  Sleep apnea can cause daytime fatigue and other serious health conditions.  Both sleep apnea and daytime fatigue can be bad for your health and well-being.  You may need to wear a device while sleeping to help keep your airway open.  If you are having surgery, make sure to tell your health care provider that you have sleep apnea. You may need to bring your device with you.  Making changes to sleep habits, diet, lifestyle, and activity can help you manage sleep apnea. This information is not intended to replace advice given to you by your health care provider. Make sure you  discuss any questions you have with your health care provider. Document Revised: 04/11/2018 Document Reviewed: 03/14/2017 Elsevier Patient Education  2021 [place].   CPAP and BPAP Information CPAP and BPAP are methods that use air pressure to keep your airways open and to help you breathe well. CPAP and BPAP use different amounts of pressure. Your health care provider will tell you whether CPAP or BPAP would be more helpful for you.  CPAP stands for "continuous positive airway pressure." With CPAP, the amount of pressure stays the same while you breathe in and out.  BPAP stands for "bi-level positive airway pressure." With BPAP, the amount of pressure will be higher when you breathe in (inhale) and lower when you breathe out(exhale). This allows you to take larger breaths. CPAP or BPAP may be used in the hospital, or your health care provider may want you to use it at home. You may need to have a sleep study before your health care provider can order a machine for  you to use at home. Why are CPAP and BPAP treatments used? CPAP or BPAP can be helpful if you have:  Sleep apnea.  Chronic obstructive pulmonary disease (COPD).  Heart failure.  Medical conditions that cause muscle weakness, including muscular dystrophy or amyotrophic lateral sclerosis (ALS).  Other problems that cause breathing to be shallow, weak, abnormal, or difficult. CPAP and BPAP are most commonly used for obstructive sleep apnea (OSA) to keep the airways from collapsing when the muscles relax during sleep. How is CPAP or BPAP administered? Both CPAP and BPAP are provided by a small machine with a flexible plastic tube that attaches to a plastic mask that you wear. Air is blown through the mask into your nose or mouth. The amount of pressure that is used to blow the air can be adjusted on the machine. Your health care provider will set the pressure setting and help you find the best mask for you. When should CPAP or  BPAP be used? In most cases, the mask only needs to be worn during sleep. Generally, the mask needs to be worn throughout the night and during any daytime naps. People with certain medical conditions may also need to wear the mask at other times when they are awake. Follow instructions from your health care provider about when to use the machine. What are some tips for using the mask?  Because the mask needs to be snug, some people feel trapped or closed-in (claustrophobic) when first using the mask. If you feel this way, you may need to get used to the mask. One way to do this is to hold the mask loosely over your nose or mouth and then gradually apply the mask more snugly. You can also gradually increase the amount of time that you use the mask.  Masks are available in various types and sizes. If your mask does not fit well, talk with your health care provider about getting a different one. Some common types of masks include: ? Full face masks, which fit over the mouth and nose. ? Nasal masks, which fit over the nose. ? Nasal pillow or prong masks, which fit into the nostrils.  If you are using a mask that fits over your nose and you tend to breathe through your mouth, a chin strap may be applied to help keep your mouth closed.  Some CPAP and BPAP machines have alarms that may sound if the mask comes off or develops a leak.  If you have trouble with the mask, it is very important that you talk with your health care provider about finding a way to make the mask easier to tolerate. Do not stop using the mask. There could be a negative impact to your health if you stop using the mask.   What are some tips for using the machine?  Place your CPAP or BPAP machine on a secure table or stand near an electrical outlet.  Know where the on/off switch is on the machine.  Follow instructions from your health care provider about how to set the pressure on your machine and when you should use it.  Do not  eat or drink while the CPAP or BPAP machine is on. Food or fluids could get pushed into your lungs by the pressure of the CPAP or BPAP.  For home use, CPAP and BPAP machines can be rented or purchased through home health care companies. Many different brands of machines are available. Renting a machine before purchasing may help you  find out which particular machine works well for you. Your insurance may also decide which machine you may get.  Keep the CPAP or BPAP machine and attachments clean. Ask your health care provider for specific instructions. Follow these instructions at home:  Do not use any products that contain nicotine or tobacco, such as cigarettes, e-cigarettes, and chewing tobacco. If you need help quitting, ask your health care provider.  Keep all follow-up visits as told by your health care provider. This is important. Contact a health care provider if:  You have redness or pressure sores on your head, face, mouth, or nose from the mask or head gear.  You have trouble using the CPAP or BPAP machine.  You cannot tolerate wearing the CPAP or BPAP mask.  Someone tells you that you snore even when wearing your CPAP or BPAP. Get help right away if:  You have trouble breathing.  You feel confused. Summary  CPAP and BPAP are methods that use air pressure to keep your airways open and to help you breathe well.  You may need to have a sleep study before your health care provider can order a machine for home use.  If you have trouble with the mask, it is very important that you talk with your health care provider about finding a way to make the mask easier to tolerate. Do not stop using the mask. There could be a negative impact to your health if you stop using the mask.  Follow instructions from your health care provider about when to use the machine. This information is not intended to replace advice given to you by your health care provider. Make sure you discuss any  questions you have with your health care provider. Document Revised: 01/09/2019 Document Reviewed: 01/12/2019 Elsevier Patient Education  2021 [employer].

## 2020-03-02 NOTE — Progress Notes (Signed)
Reviewed and agree with assessment/plan.   Chesley Mires, MD [facility] Pulmonary/Critical Care 03/02/2020, 2:03 PM Pager:  [phone]

## 2020-03-02 NOTE — Progress Notes (Signed)
$'@Patient'L$  ID: Robert [name], male    DOB: [birth year], 70 y.o.   MRN: [id number]  Chief Complaint  Patient presents with  . Follow-up    Sleep study results    Referring provider: Plotnikov, Evie Lacks, MD  HPI: 70 year old male, current some day smoker. PMH significant for HTN, IBS, hyperlipidemia. Patient of Dr. Halford Chessman, seen for sleep consult on 01/27/20 for snoring.   03/02/2020 Patient presents today to review sleep study results. He reports symptoms of snoring and restless sleep. [place] 12/24. Home sleep test in February 2022 showed severe sleep apnea. We reviewed treatment options include weight loss, side sleeping position, oral appliance, CPAP or ENT referral for possible surgical options. Due to severity of sleep apnea and hypoxia recommend CPAP therapy. He is agreeing with plan.   Testing:  HST 02/05/20 >> AHI 32.1, SpO2 low 68%  Allergies  Allergen Reactions  . Atorvastatin     REACTION: INTOL to Stains w/ myalgias    Immunization History  Administered Date(s) Administered  . Influenza Split 11/02/2010, 10/18/2011, 10/01/2012  . Influenza Whole 12/05/2009  . Influenza-Unspecified 10/01/2013, 10/18/2014  . Pneumococcal Conjugate-13 12/14/2003  . Pneumococcal Polysaccharide-23 05/31/2015, 03/20/2017  . Tdap 05/19/2014    Past Medical History:  Diagnosis Date  . Allergy   . Anxiety   . ED (erectile dysfunction)   . GERD (gastroesophageal reflux disease)   . History of gastritis   . History of UTI   . Hx of colonic polyps   . Hyperlipidemia   . Hypertension   . IBS (irritable bowel syndrome)   . Myalgia   . Venous insufficiency     Tobacco History: Social History   Tobacco Use  Smoking Status Current Some Day Smoker  . Types: Cigars  Smokeless Tobacco Never Used  Tobacco Comment   smokes one cigar once a month-01/27/20   Ready to quit: Not Answered Counseling given: Not Answered Comment: smokes one cigar once a month-01/27/20   Outpatient Medications  Prior to Visit  Medication Sig Dispense Refill  . ALPRAZolam (XANAX) 0.5 MG tablet Take 1 tablet (0.5 mg total) by mouth 2 (two) times daily as needed for anxiety. 180 tablet 1  . amLODipine (NORVASC) 10 MG tablet TAKE 1 TABLET(10 MG) BY MOUTH DAILY 90 tablet 3  . Ascorbic Acid (VITAMIN C) 100 MG tablet Take 100 mg by mouth daily.    Marland Kitchen aspirin 81 MG tablet Take 81 mg by mouth daily.    . Cholecalciferol (VITAMIN D3) 2000 units capsule Take 1 capsule (2,000 Units total) by mouth daily. 100 capsule 3  . gemfibrozil (LOPID) 600 MG tablet 1 po bid 180 tablet 3  . KRILL OIL PO Take by mouth.    . losartan (COZAAR) 100 MG tablet Take 1 tablet (100 mg total) by mouth daily. 90 tablet 3  . metoprolol succinate (TOPROL-XL) 100 MG 24 hr tablet Take 0.5 tablets (50 mg total) by mouth at bedtime. Take with or immediately following a meal. 90 tablet 3  . omeprazole (PRILOSEC) 20 MG capsule Take 1 capsule (20 mg total) by mouth 2 (two) times daily before a meal. 180 capsule 3  . tamsulosin (FLOMAX) 0.4 MG CAPS capsule Take 1 capsule (0.4 mg total) by mouth daily after supper. 90 capsule 3  . tadalafil (CIALIS) 20 MG tablet Take 1 tablet (20 mg total) by mouth daily as needed for erectile dysfunction. 10 tablet 3   No facility-administered medications prior to visit.   Review of Systems  Review of Systems  Constitutional: Positive for fatigue.  HENT: Negative.   Respiratory: Negative.   Cardiovascular: Negative.      Physical Exam  BP 124/78 (BP Location: Left Arm, Cuff Size: Normal)   Pulse 66   Temp 98 F (36.7 C)   Ht 5' 11"  (1.803 m)   Wt 223 lb (101.2 kg)   SpO2 98%   BMI 31.10 kg/m  Physical Exam Constitutional:      Appearance: Normal appearance.  HENT:     Mouth/Throat:     Mouth: Mucous membranes are moist.     Pharynx: Oropharynx is clear.  Cardiovascular:     Rate and Rhythm: Normal rate and regular rhythm.  Pulmonary:     Effort: Pulmonary effort is normal.     Breath  sounds: Normal breath sounds.  Musculoskeletal:        General: Normal range of motion.  Skin:    General: Skin is warm and dry.  Neurological:     General: No focal deficit present.     Mental Status: He is alert and oriented to person, place, and time. Mental status is at baseline.  Psychiatric:        Mood and Affect: Mood normal.        Behavior: Behavior normal.        Thought Content: Thought content normal.        Judgment: Judgment normal.      Lab Results:  CBC    Component Value Date/Time   WBC 3.8 (L) 05/06/2019 1144   RBC 4.74 05/06/2019 1144   HGB 13.3 05/06/2019 1144   HCT 39.5 05/06/2019 1144   PLT 262.0 05/06/2019 1144   MCV 83.5 05/06/2019 1144   MCH 28.8 07/23/2009 0450   MCHC 33.7 05/06/2019 1144   RDW 14.0 05/06/2019 1144   LYMPHSABS 1.5 05/06/2019 1144   MONOABS 0.3 05/06/2019 1144   EOSABS 0.3 05/06/2019 1144   BASOSABS 0.0 05/06/2019 1144    BMET    Component Value Date/Time   NA 138 05/06/2019 1144   K 4.2 05/06/2019 1144   CL 105 05/06/2019 1144   CO2 29 05/06/2019 1144   GLUCOSE 102 (H) 05/06/2019 1144   BUN 18 05/06/2019 1144   CREATININE 1.27 05/06/2019 1144   CALCIUM 9.8 05/06/2019 1144   GFRNONAA 58 (L) 07/24/2009 0437   GFRAA  07/24/2009 0437    >60        The eGFR has been calculated using the MDRD equation. This calculation has not been validated in all clinical situations. eGFR's persistently <60 mL/min signify possible Chronic Kidney Disease.    BNP No results found for: BNP  ProBNP No results found for: PROBNP  Imaging: No results found.   Assessment & Plan:   Severe sleep apnea - HST 02/05/20 >> AHI 32.1, SpO2 low 68% (baseline 93%) - Reviewed treatment options and risks of untreated sleep apnea at length with patient  - Recommend starting CPAP therapy auto titrate 5-20cm h20 - Encourage patient wear CPAP every night for 4-6 hours, advised not to drive if experiencing excessive daytime fatigue/solmonce.  Maintain healthy weight.  - FU 31-90 days after receiving CPAP machine    Martyn Ehrich, NP 03/02/2020

## 2020-03-26 ENCOUNTER — Other Ambulatory Visit: Payer: Self-pay | Admitting: Internal Medicine

## 2020-03-28 ENCOUNTER — Telehealth: Payer: Self-pay | Admitting: Pulmonary Disease

## 2020-03-28 NOTE — Telephone Encounter (Signed)
Called and spoke with patient. He was calling to follow up on the cpap machine order. I advised him that the order had been placed on 03/02/20 to [facility]. He stated that he had received a call a few weeks ago from [facility] stating they had his order. I reminded him of the cpap recall. He verbalized understanding.   Nothing further needed at time of call.

## 2020-04-29 ENCOUNTER — Other Ambulatory Visit: Payer: Self-pay | Admitting: Internal Medicine

## 2020-05-08 ENCOUNTER — Other Ambulatory Visit: Payer: Self-pay

## 2020-05-09 ENCOUNTER — Encounter: Payer: Self-pay | Admitting: Internal Medicine

## 2020-05-09 ENCOUNTER — Ambulatory Visit (INDEPENDENT_AMBULATORY_CARE_PROVIDER_SITE_OTHER): Payer: Medicare PPO | Admitting: Internal Medicine

## 2020-05-09 VITALS — BP 130/80 | HR 58 | Temp 98.6°F | Ht 71.0 in | Wt 226.2 lb

## 2020-05-09 DIAGNOSIS — Z0000 Encounter for general adult medical examination without abnormal findings: Secondary | ICD-10-CM | POA: Diagnosis not present

## 2020-05-09 DIAGNOSIS — E785 Hyperlipidemia, unspecified: Secondary | ICD-10-CM

## 2020-05-09 DIAGNOSIS — Z23 Encounter for immunization: Secondary | ICD-10-CM

## 2020-05-09 DIAGNOSIS — I10 Essential (primary) hypertension: Secondary | ICD-10-CM | POA: Diagnosis not present

## 2020-05-09 LAB — CBC WITH DIFFERENTIAL/PLATELET
Basophils Absolute: 0 10*3/uL (ref 0.0–0.1)
Basophils Relative: 0.8 % (ref 0.0–3.0)
Eosinophils Absolute: 0.4 10*3/uL (ref 0.0–0.7)
Eosinophils Relative: 9.6 % — ABNORMAL HIGH (ref 0.0–5.0)
HCT: 39.8 % (ref 39.0–52.0)
Hemoglobin: 13.6 g/dL (ref 13.0–17.0)
Lymphocytes Relative: 36.6 % (ref 12.0–46.0)
Lymphs Abs: 1.4 10*3/uL (ref 0.7–4.0)
MCHC: 34.2 g/dL (ref 30.0–36.0)
MCV: 80.9 fl (ref 78.0–100.0)
Monocytes Absolute: 0.4 10*3/uL (ref 0.1–1.0)
Monocytes Relative: 11.2 % (ref 3.0–12.0)
Neutro Abs: 1.6 10*3/uL (ref 1.4–7.7)
Neutrophils Relative %: 41.8 % — ABNORMAL LOW (ref 43.0–77.0)
Platelets: 288 10*3/uL (ref 150.0–400.0)
RBC: 4.92 Mil/uL (ref 4.22–5.81)
RDW: 13.7 % (ref 11.5–15.5)
WBC: 3.8 10*3/uL — ABNORMAL LOW (ref 4.0–10.5)

## 2020-05-09 LAB — LIPID PANEL
Cholesterol: 159 mg/dL (ref 0–200)
HDL: 50.2 mg/dL (ref >39.00)
LDL Cholesterol: 86 mg/dL (ref 0–99)
NonHDL: 108.84
Total CHOL/HDL Ratio: 3
Triglycerides: 113 mg/dL (ref 0.0–149.0)
VLDL: 22.6 mg/dL (ref 0.0–40.0)

## 2020-05-09 LAB — COMPREHENSIVE METABOLIC PANEL
ALT: 30 U/L (ref 0–53)
AST: 39 U/L — ABNORMAL HIGH (ref 0–37)
Albumin: 4.5 g/dL (ref 3.5–5.2)
Alkaline Phosphatase: 67 U/L (ref 39–117)
BUN: 15 mg/dL (ref 6–23)
CO2: 27 mEq/L (ref 19–32)
Calcium: 9.9 mg/dL (ref 8.4–10.5)
Chloride: 103 mEq/L (ref 96–112)
Creatinine, Ser: 1.35 mg/dL (ref 0.40–1.50)
GFR: 53.26 mL/min — ABNORMAL LOW (ref >60.00)
Glucose, Bld: 106 mg/dL — ABNORMAL HIGH (ref 70–99)
Potassium: 3.9 mEq/L (ref 3.5–5.1)
Sodium: 138 mEq/L (ref 135–145)
Total Bilirubin: 0.6 mg/dL (ref 0.2–1.2)
Total Protein: 7.4 g/dL (ref 6.0–8.3)

## 2020-05-09 LAB — URINALYSIS
Bilirubin Urine: NEGATIVE
Hgb urine dipstick: NEGATIVE
Ketones, ur: NEGATIVE
Leukocytes,Ua: NEGATIVE
Nitrite: NEGATIVE
Specific Gravity, Urine: 1.02 (ref 1.000–1.030)
Urine Glucose: NEGATIVE
Urobilinogen, UA: 0.2 (ref 0.0–1.0)
pH: 6.5 (ref 5.0–8.0)

## 2020-05-09 LAB — PSA: PSA: 1.72 ng/mL (ref 0.10–4.00)

## 2020-05-09 LAB — TSH: TSH: 1.82 u[IU]/mL (ref 0.35–4.50)

## 2020-05-09 NOTE — Assessment & Plan Note (Signed)
  We discussed age appropriate health related issues, including available/recomended screening tests and vaccinations. Labs were ordered to be later reviewed . All questions were answered. We discussed one or more of the following - seat belt use, use of sunscreen/sun exposure exercise, safe sex, fall risk reduction, second hand smoke exposure, firearm use and storage, seat belt use, a need for adhering to healthy diet and exercise. Labs were ordered.  All questions were answered.  Shingrix Rx 3/20 CT ca scoring info given 3/20 Colon due in 2024

## 2020-05-09 NOTE — Assessment & Plan Note (Signed)
A cardiac CT scan for coronary calcium was offered  

## 2020-05-09 NOTE — Progress Notes (Signed)
Subjective:  Patient ID: Robert [name], male    DOB: Jul 06, 1950  Age: 70 y.o. MRN: [id number]  CC: Annual Exam (Request to change back to viagra)   HPI Robert [name] presents for a well exam  Pt is waiting on a CPAP machine  Outpatient Medications Prior to Visit  Medication Sig Dispense Refill  . ALPRAZolam (XANAX) 0.5 MG tablet Take 1 tablet (0.5 mg total) by mouth 2 (two) times daily as needed for anxiety. 180 tablet 1  . amLODipine (NORVASC) 10 MG tablet TAKE 1 TABLET(10 MG) BY MOUTH DAILY 90 tablet 3  . Ascorbic Acid (VITAMIN C) 100 MG tablet Take 100 mg by mouth daily.    Marland Kitchen aspirin 81 MG tablet Take 81 mg by mouth daily.    . Cholecalciferol (VITAMIN D3) 2000 units capsule Take 1 capsule (2,000 Units total) by mouth daily. 100 capsule 3  . gemfibrozil (LOPID) 600 MG tablet TAKE 1 TABLET BY MOUTH TWICE DAILY 180 tablet 3  . KRILL OIL PO Take by mouth.    . losartan (COZAAR) 100 MG tablet Take 1 tablet (100 mg total) by mouth daily. 90 tablet 3  . metoprolol succinate (TOPROL-XL) 100 MG 24 hr tablet Take 0.5 tablets (50 mg total) by mouth at bedtime. Take with or immediately following a meal. 90 tablet 3  . omeprazole (PRILOSEC) 20 MG capsule Take 1 capsule (20 mg total) by mouth 2 (two) times daily before a meal. 180 capsule 3  . tamsulosin (FLOMAX) 0.4 MG CAPS capsule Take 1 capsule (0.4 mg total) by mouth daily after supper. 90 capsule 3  . tadalafil (CIALIS) 20 MG tablet Take 1 tablet (20 mg total) by mouth daily as needed for erectile dysfunction. 10 tablet 3   No facility-administered medications prior to visit.    ROS: Review of Systems  Constitutional: Positive for fatigue. Negative for appetite change and unexpected weight change.  HENT: Negative for congestion, nosebleeds, sneezing, sore throat and trouble swallowing.   Eyes: Negative for itching and visual disturbance.  Respiratory: Negative for cough.   Cardiovascular: Negative for chest pain, palpitations and leg  swelling.  Gastrointestinal: Negative for abdominal distention, blood in stool, diarrhea and nausea.  Genitourinary: Negative for frequency and hematuria.  Musculoskeletal: Negative for back pain, gait problem, joint swelling and neck pain.  Skin: Negative for rash.  Neurological: Negative for dizziness, tremors, speech difficulty and weakness.  Psychiatric/Behavioral: Negative for agitation, dysphoric mood, sleep disturbance and suicidal ideas. The patient is not nervous/anxious.     Objective:  BP 130/80 (BP Location: Left Arm)   Pulse (!) 58   Temp 98.6 F (37 C) (Oral)   Ht 5\' 11"  (1.803 m)   Wt 226 lb 3.2 oz (102.6 kg)   SpO2 97%   BMI 31.55 kg/m   BP Readings from Last 3 Encounters:  05/09/20 130/80  03/02/20 124/78  01/27/20 140/80    Wt Readings from Last 3 Encounters:  05/09/20 226 lb 3.2 oz (102.6 kg)  03/02/20 223 lb (101.2 kg)  01/27/20 228 lb (103.4 kg)    Physical Exam Constitutional:      General: He is not in acute distress.    Appearance: He is well-developed. He is obese.     Comments: NAD  Eyes:     Conjunctiva/sclera: Conjunctivae normal.     Pupils: Pupils are equal, round, and reactive to light.  Neck:     Thyroid: No thyromegaly.     Vascular: No JVD.  Cardiovascular:  Rate and Rhythm: Normal rate and regular rhythm.     Heart sounds: Normal heart sounds. No murmur heard. No friction rub. No gallop.   Pulmonary:     Effort: Pulmonary effort is normal. No respiratory distress.     Breath sounds: Normal breath sounds. No wheezing or rales.  Chest:     Chest wall: No tenderness.  Abdominal:     General: Bowel sounds are normal. There is no distension.     Palpations: Abdomen is soft. There is no mass.     Tenderness: There is no abdominal tenderness. There is no guarding or rebound.  Genitourinary:    Rectum: Normal. Guaiac result negative.  Musculoskeletal:        General: No tenderness. Normal range of motion.     Cervical back:  Normal range of motion.  Lymphadenopathy:     Cervical: No cervical adenopathy.  Skin:    General: Skin is warm and dry.     Findings: No rash.  Neurological:     Mental Status: He is alert and oriented to person, place, and time.     Cranial Nerves: No cranial nerve deficit.     Motor: No abnormal muscle tone.     Coordination: Coordination normal.     Gait: Gait normal.     Deep Tendon Reflexes: Reflexes are normal and symmetric.  Psychiatric:        Behavior: Behavior normal.        Thought Content: Thought content normal.        Judgment: Judgment normal.    prostate 1+  Lab Results  Component Value Date   WBC 3.8 (L) 05/06/2019   HGB 13.3 05/06/2019   HCT 39.5 05/06/2019   PLT 262.0 05/06/2019   GLUCOSE 102 (H) 05/06/2019   CHOL 154 05/06/2019   TRIG 132.0 05/06/2019   HDL 43.30 05/06/2019   LDLCALC 84 05/06/2019   ALT 32 05/06/2019   AST 41 (H) 05/06/2019   NA 138 05/06/2019   K 4.2 05/06/2019   CL 105 05/06/2019   CREATININE 1.27 05/06/2019   BUN 18 05/06/2019   CO2 29 05/06/2019   TSH 1.86 05/06/2019   PSA 1.43 05/06/2019   HGBA1C 6.5 03/26/2018    No results found.  Assessment & Plan:    Walker Kehr, MD

## 2020-05-09 NOTE — Assessment & Plan Note (Addendum)
Amlodipine, Losartan  A cardiac CT scan for coronary calcium was offered

## 2020-05-09 NOTE — Patient Instructions (Signed)

## 2020-05-11 ENCOUNTER — Telehealth: Payer: Self-pay | Admitting: Internal Medicine

## 2020-05-11 DIAGNOSIS — E785 Hyperlipidemia, unspecified: Secondary | ICD-10-CM

## 2020-05-11 NOTE — Telephone Encounter (Signed)
I've  entered the order.  He needs to call the number I gave him and schedule his own appointment.  Thank you

## 2020-05-11 NOTE — Telephone Encounter (Signed)
Patient called and said that he would like to do the calcium cardiac scoring test. Please advise

## 2020-05-12 NOTE — Telephone Encounter (Signed)
Notified pt w/MD response.../lmb 

## 2020-05-13 ENCOUNTER — Other Ambulatory Visit: Payer: Self-pay | Admitting: Internal Medicine

## 2020-05-13 DIAGNOSIS — R7989 Other specified abnormal findings of blood chemistry: Secondary | ICD-10-CM

## 2020-05-13 DIAGNOSIS — N1831 Chronic kidney disease, stage 3a: Secondary | ICD-10-CM | POA: Insufficient documentation

## 2020-05-24 ENCOUNTER — Telehealth: Payer: Self-pay | Admitting: Internal Medicine

## 2020-05-24 MED ORDER — GEMFIBROZIL 600 MG PO TABS: ORAL_TABLET | ORAL | 3 refills | Status: DC

## 2020-05-24 MED ORDER — OMEPRAZOLE 20 MG PO CPDR
20.0000 mg | DELAYED_RELEASE_CAPSULE | Freq: Two times a day (BID) | ORAL | 3 refills | Status: DC
Start: 2020-05-24 — End: 2020-08-02

## 2020-05-24 MED ORDER — LEVOCETIRIZINE DIHYDROCHLORIDE 5 MG PO TABS: 5.0000 mg | ORAL_TABLET | Freq: Every evening | ORAL | 3 refills | Status: DC

## 2020-05-24 MED ORDER — TADALAFIL 20 MG PO TABS: 20.0000 mg | ORAL_TABLET | Freq: Every day | ORAL | 3 refills | Status: DC | PRN

## 2020-05-24 MED ORDER — METOPROLOL SUCCINATE ER 100 MG PO TB24: 50.0000 mg | ORAL_TABLET | Freq: Every day | ORAL | 3 refills | Status: DC

## 2020-05-24 MED ORDER — AMLODIPINE BESYLATE 10 MG PO TABS: ORAL_TABLET | ORAL | 3 refills | Status: DC

## 2020-05-24 MED ORDER — LOSARTAN POTASSIUM 100 MG PO TABS: 100.0000 mg | ORAL_TABLET | Freq: Every day | ORAL | 3 refills | Status: DC

## 2020-05-24 MED ORDER — TAMSULOSIN HCL 0.4 MG PO CAPS: 0.4000 mg | ORAL_CAPSULE | Freq: Every day | ORAL | 3 refills | Status: DC

## 2020-05-24 NOTE — Telephone Encounter (Signed)
   Patient calling to request refill on "all of his medications" including Levocetirizine (not on med list) Unsure what medications should or shouldn't be refilled. Patient had CPE 05/09/20  [facility], [place] - [address] AT [place]

## 2020-05-24 NOTE — Telephone Encounter (Signed)
Reviewed chart pt is up-to-date sent refills to [facility].../lmb  

## 2020-06-01 ENCOUNTER — Ambulatory Visit: Payer: Medicare PPO | Admitting: Primary Care

## 2020-06-08 ENCOUNTER — Other Ambulatory Visit: Payer: Self-pay | Admitting: Internal Medicine

## 2020-06-09 ENCOUNTER — Encounter: Payer: Self-pay | Admitting: Internal Medicine

## 2020-06-10 DIAGNOSIS — M4722 Other spondylosis with radiculopathy, cervical region: Secondary | ICD-10-CM | POA: Diagnosis not present

## 2020-06-16 ENCOUNTER — Other Ambulatory Visit: Payer: Self-pay | Admitting: Internal Medicine

## 2020-06-16 DIAGNOSIS — R7989 Other specified abnormal findings of blood chemistry: Secondary | ICD-10-CM

## 2020-06-16 DIAGNOSIS — M5412 Radiculopathy, cervical region: Secondary | ICD-10-CM | POA: Diagnosis not present

## 2020-06-16 DIAGNOSIS — N1831 Chronic kidney disease, stage 3a: Secondary | ICD-10-CM

## 2020-06-21 DIAGNOSIS — M5412 Radiculopathy, cervical region: Secondary | ICD-10-CM | POA: Diagnosis not present

## 2020-06-22 ENCOUNTER — Ambulatory Visit (INDEPENDENT_AMBULATORY_CARE_PROVIDER_SITE_OTHER)
Admission: RE | Admit: 2020-06-22 | Discharge: 2020-06-22 | Disposition: A | Discharge status: Home / Self Care | Payer: Self-pay | Source: Ambulatory Visit | Attending: Internal Medicine | Admitting: Internal Medicine

## 2020-06-22 ENCOUNTER — Other Ambulatory Visit: Payer: Self-pay

## 2020-06-22 DIAGNOSIS — E785 Hyperlipidemia, unspecified: Secondary | ICD-10-CM

## 2020-06-23 ENCOUNTER — Telehealth: Payer: Self-pay | Admitting: Internal Medicine

## 2020-06-23 ENCOUNTER — Other Ambulatory Visit: Payer: Self-pay | Admitting: Internal Medicine

## 2020-06-23 DIAGNOSIS — M5412 Radiculopathy, cervical region: Secondary | ICD-10-CM | POA: Diagnosis not present

## 2020-06-23 MED ORDER — PRAVASTATIN SODIUM 20 MG PO TABS: 20.0000 mg | ORAL_TABLET | Freq: Every day | ORAL | 11 refills | Status: DC

## 2020-06-23 NOTE — Telephone Encounter (Signed)
Sure. Pls schedule Thx

## 2020-06-23 NOTE — Telephone Encounter (Signed)
Patient is requesting a virtual visit to discuss his CT scan.   Please advise.

## 2020-06-26 ENCOUNTER — Other Ambulatory Visit: Payer: Self-pay | Admitting: Internal Medicine

## 2020-06-29 DIAGNOSIS — M5412 Radiculopathy, cervical region: Secondary | ICD-10-CM | POA: Diagnosis not present

## 2020-07-05 ENCOUNTER — Ambulatory Visit
Admission: RE | Admit: 2020-07-05 | Discharge: 2020-07-05 | Disposition: A | Discharge status: Home / Self Care | Payer: Medicare PPO | Source: Ambulatory Visit | Attending: Internal Medicine | Admitting: Internal Medicine

## 2020-07-05 DIAGNOSIS — R7989 Other specified abnormal findings of blood chemistry: Secondary | ICD-10-CM | POA: Diagnosis not present

## 2020-07-05 DIAGNOSIS — K8020 Calculus of gallbladder without cholecystitis without obstruction: Secondary | ICD-10-CM | POA: Diagnosis not present

## 2020-07-05 DIAGNOSIS — N189 Chronic kidney disease, unspecified: Secondary | ICD-10-CM | POA: Diagnosis not present

## 2020-07-05 DIAGNOSIS — N1831 Chronic kidney disease, stage 3a: Secondary | ICD-10-CM

## 2020-07-06 ENCOUNTER — Telehealth (INDEPENDENT_AMBULATORY_CARE_PROVIDER_SITE_OTHER): Payer: Medicare PPO | Admitting: Internal Medicine

## 2020-07-06 ENCOUNTER — Encounter: Payer: Self-pay | Admitting: Internal Medicine

## 2020-07-06 DIAGNOSIS — I10 Essential (primary) hypertension: Secondary | ICD-10-CM

## 2020-07-06 DIAGNOSIS — E785 Hyperlipidemia, unspecified: Secondary | ICD-10-CM

## 2020-07-06 DIAGNOSIS — K8020 Calculus of gallbladder without cholecystitis without obstruction: Secondary | ICD-10-CM

## 2020-07-06 DIAGNOSIS — M791 Myalgia, unspecified site: Secondary | ICD-10-CM | POA: Diagnosis not present

## 2020-07-06 DIAGNOSIS — N1831 Chronic kidney disease, stage 3a: Secondary | ICD-10-CM | POA: Diagnosis not present

## 2020-07-06 DIAGNOSIS — R748 Abnormal levels of other serum enzymes: Secondary | ICD-10-CM | POA: Diagnosis not present

## 2020-07-06 DIAGNOSIS — M5412 Radiculopathy, cervical region: Secondary | ICD-10-CM | POA: Diagnosis not present

## 2020-07-06 NOTE — Assessment & Plan Note (Signed)
A cardiac CT scan for coronary calcium score is 143 in 7/22.  The findings were discussed.  He is asymptomatic.  We will discontinue gemfibrozil and start pravastatin at 20 mg daily.  Hope he can tolerate it.  Return to clinic in 3 months with lipids and c-Met prior.

## 2020-07-06 NOTE — Assessment & Plan Note (Signed)
Continue with amlodipine, losartan

## 2020-07-06 NOTE — Progress Notes (Signed)
Virtual Visit via Video Note  I connected with Robert [name] on 07/06/20 at 10:20 AM EDT by a video enabled telemedicine application and verified that I am speaking with the correct person using two identifiers.   I discussed the limitations of evaluation and management by telemedicine and the availability of in person appointments. The patient expressed understanding and agreed to proceed.  I was located at our [facility] office. The patient was at home. There was no one else present in the visit.   History of Present Illness: We need to follow-up on his abd [place] and he is coronary calcium chest CT, dyslipidemia  There has been no abdominal pain, nausea, vomiting, diarrhea.  No chest pain or shortness of breath   Observations/Objective: The patient appears to be in no acute distress, looks well.  Assessment and Plan:  See my Assessment and Plan. Follow Up Instructions:    I discussed the assessment and treatment plan with the patient. The patient was provided an opportunity to ask questions and all were answered. The patient agreed with the plan and demonstrated an understanding of the instructions.   The patient was advised to call back or seek an in-person evaluation if the symptoms worsen or if the condition fails to improve as anticipated.  I provided face-to-face time during this encounter. We were at different locations.   Walker Kehr, MD

## 2020-07-06 NOTE — Assessment & Plan Note (Addendum)
New.  We discussed ultrasound findings.  The patient denies symptoms.  We discussed a natural course of cholelithiasis.  I suggested that he sees a Psychologist, sport and exercise to discuss his options more in detail.

## 2020-07-06 NOTE — Assessment & Plan Note (Signed)
A cardiac CT scan for coronary calcium score is 143 in 7/22.  The findings were discussed.  He is asymptomatic.  We will discontinue gemfibrozil and start pravastatin at 20 mg daily.  Hope he can tolerate it.  Return to clinic in 3 months with lipids and c-Met prior.  Will check CK in 3 months as well.

## 2020-07-06 NOTE — Assessment & Plan Note (Signed)
New.  Abdominal ultrasound reveals normal kidneys-7/22 Continue with good hydration

## 2020-07-11 DIAGNOSIS — M5412 Radiculopathy, cervical region: Secondary | ICD-10-CM | POA: Diagnosis not present

## 2020-08-01 DIAGNOSIS — G4733 Obstructive sleep apnea (adult) (pediatric): Secondary | ICD-10-CM | POA: Diagnosis not present

## 2020-08-02 ENCOUNTER — Other Ambulatory Visit: Payer: Self-pay | Admitting: Internal Medicine

## 2020-08-10 DIAGNOSIS — M546 Pain in thoracic spine: Secondary | ICD-10-CM | POA: Diagnosis not present

## 2020-08-24 ENCOUNTER — Telehealth: Payer: Self-pay | Admitting: Pulmonary Disease

## 2020-08-24 DIAGNOSIS — G4733 Obstructive sleep apnea (adult) (pediatric): Secondary | ICD-10-CM

## 2020-08-24 NOTE — Telephone Encounter (Signed)
VS please advise if we can send in a new order for the new mask that the pt is requesting.

## 2020-08-25 NOTE — Telephone Encounter (Signed)
Call made to patient, confirmed DOB. Made aware the order has been sent to [facility]. [facility] number given so if patient has questions he is able to contact them. Voiced understanding.   Nothing further needed at this time.

## 2020-08-25 NOTE — Telephone Encounter (Signed)
I have sent order to have his CPAP mask refit through [facility].

## 2020-09-01 DIAGNOSIS — G4733 Obstructive sleep apnea (adult) (pediatric): Secondary | ICD-10-CM | POA: Diagnosis not present

## 2020-09-06 ENCOUNTER — Telehealth: Payer: Self-pay | Admitting: Pulmonary Disease

## 2020-09-06 DIAGNOSIS — G4733 Obstructive sleep apnea (adult) (pediatric): Secondary | ICD-10-CM

## 2020-09-06 NOTE — Telephone Encounter (Signed)
I called and spoke with patient who had many concerns about CPAP and mask. Patient stated that his full face mask is so tight and leaves lines on his face and wondering if this is normal, he tried to use nasal pillows and did not like them so went back to full face mask. Patient stated pressures have been going to 18cm and is worried about that as well, if breathing is getting worse. Will route to Dr. Halford Chessman for recs.  Dr. Halford Chessman, please advise. Thanks!

## 2020-09-07 NOTE — Telephone Encounter (Signed)
Pt returning missed call [phone]

## 2020-09-07 NOTE — Telephone Encounter (Signed)
Please arrange for him to have CPAP mask fitting in sleep lab.

## 2020-09-07 NOTE — Telephone Encounter (Signed)
ATC Patient.  LM to call back. Order pending.

## 2020-09-07 NOTE — Telephone Encounter (Signed)
Called patient but he did not answer. Left message for him to call us back.  

## 2020-09-26 DIAGNOSIS — G4733 Obstructive sleep apnea (adult) (pediatric): Secondary | ICD-10-CM | POA: Diagnosis not present

## 2020-09-26 NOTE — Telephone Encounter (Signed)
Spoke with the pt and notified of response per VS  He is okay with mask fitting and order for this was signed  Nothing further needed

## 2020-10-01 DIAGNOSIS — G4733 Obstructive sleep apnea (adult) (pediatric): Secondary | ICD-10-CM | POA: Diagnosis not present

## 2020-10-12 ENCOUNTER — Other Ambulatory Visit: Payer: Self-pay

## 2020-10-12 ENCOUNTER — Encounter: Payer: Self-pay | Admitting: Internal Medicine

## 2020-10-12 ENCOUNTER — Ambulatory Visit: Payer: Medicare PPO | Admitting: Internal Medicine

## 2020-10-12 DIAGNOSIS — R739 Hyperglycemia, unspecified: Secondary | ICD-10-CM | POA: Diagnosis not present

## 2020-10-12 DIAGNOSIS — I10 Essential (primary) hypertension: Secondary | ICD-10-CM | POA: Diagnosis not present

## 2020-10-12 DIAGNOSIS — M791 Myalgia, unspecified site: Secondary | ICD-10-CM

## 2020-10-12 DIAGNOSIS — E785 Hyperlipidemia, unspecified: Secondary | ICD-10-CM | POA: Diagnosis not present

## 2020-10-12 DIAGNOSIS — N1831 Chronic kidney disease, stage 3a: Secondary | ICD-10-CM

## 2020-10-12 DIAGNOSIS — R748 Abnormal levels of other serum enzymes: Secondary | ICD-10-CM

## 2020-10-12 LAB — COMPREHENSIVE METABOLIC PANEL
ALT: 55 U/L — ABNORMAL HIGH (ref 0–53)
AST: 56 U/L — ABNORMAL HIGH (ref 0–37)
Albumin: 4.4 g/dL (ref 3.5–5.2)
Alkaline Phosphatase: 97 U/L (ref 39–117)
BUN: 15 mg/dL (ref 6–23)
CO2: 27 mEq/L (ref 19–32)
Calcium: 9.8 mg/dL (ref 8.4–10.5)
Chloride: 105 mEq/L (ref 96–112)
Creatinine, Ser: 1.29 mg/dL (ref 0.40–1.50)
GFR: 56.08 mL/min — ABNORMAL LOW (ref >60.00)
Glucose, Bld: 111 mg/dL — ABNORMAL HIGH (ref 70–99)
Potassium: 3.7 mEq/L (ref 3.5–5.1)
Sodium: 138 mEq/L (ref 135–145)
Total Bilirubin: 0.6 mg/dL (ref 0.2–1.2)
Total Protein: 7.6 g/dL (ref 6.0–8.3)

## 2020-10-12 LAB — LIPID PANEL
Cholesterol: 209 mg/dL — ABNORMAL HIGH (ref 0–200)
HDL: 44 mg/dL (ref >39.00)
NonHDL: 164.99
Total CHOL/HDL Ratio: 5
Triglycerides: 237 mg/dL — ABNORMAL HIGH (ref 0.0–149.0)
VLDL: 47.4 mg/dL — ABNORMAL HIGH (ref 0.0–40.0)

## 2020-10-12 LAB — HEMOGLOBIN A1C: Hgb A1c MFr Bld: 6.3 % (ref 4.6–6.5)

## 2020-10-12 LAB — LDL CHOLESTEROL, DIRECT: Direct LDL: 47 mg/dL

## 2020-10-12 MED ORDER — SILDENAFIL CITRATE 100 MG PO TABS: 50.0000 mg | ORAL_TABLET | Freq: Every day | ORAL | 5 refills | Status: DC | PRN

## 2020-10-12 NOTE — Assessment & Plan Note (Addendum)
Will monitor CK. No myalgias

## 2020-10-12 NOTE — Assessment & Plan Note (Signed)
Continue on Amlodipine, Losartan

## 2020-10-12 NOTE — Assessment & Plan Note (Signed)
Will monitor CK. No myalgias

## 2020-10-12 NOTE — Progress Notes (Signed)
Subjective:  Patient ID: Robert [name], male    DOB: 1950/12/09  Age: 70 y.o. MRN: [id number]  CC: Follow-up (3 month f/u) and Medication Problem (Want to discuss changing his Cialis)   HPI Robert [name] presents for DM, HTN, elevated chol/TG f/u  Outpatient Medications Prior to Visit  Medication Sig Dispense Refill   ALPRAZolam (XANAX) 0.5 MG tablet Take 1 tablet (0.5 mg total) by mouth 2 (two) times daily as needed for anxiety. 180 tablet 1   amLODipine (NORVASC) 10 MG tablet TAKE 1 TABLET(10 MG) BY MOUTH DAILY 90 tablet 3   Ascorbic Acid (VITAMIN C) 100 MG tablet Take 100 mg by mouth daily.     aspirin 81 MG tablet Take 81 mg by mouth daily.     Cholecalciferol (VITAMIN D3) 2000 units capsule Take 1 capsule (2,000 Units total) by mouth daily. 100 capsule 3   gemfibrozil (LOPID) 600 MG tablet TAKE 1 TABLET BY MOUTH TWICE DAILY 180 tablet 3   KRILL OIL PO Take by mouth.     levocetirizine (XYZAL) 5 MG tablet Take 1 tablet (5 mg total) by mouth every evening. 90 tablet 3   losartan (COZAAR) 100 MG tablet Take 1 tablet (100 mg total) by mouth daily. 90 tablet 3   metoprolol succinate (TOPROL-XL) 100 MG 24 hr tablet TAKE 1 TABLET(100 MG) BY MOUTH AT BEDTIME WITH OR IMMEDIATELY FOLLOWING A MEAL 90 tablet 3   omeprazole (PRILOSEC) 20 MG capsule TAKE 1 CAPSULE(20 MG) BY MOUTH TWICE DAILY BEFORE A MEAL 180 capsule 3   pravastatin (PRAVACHOL) 20 MG tablet Take 1 tablet (20 mg total) by mouth daily. 30 tablet 11   tadalafil (CIALIS) 20 MG tablet Take 1 tablet (20 mg total) by mouth daily as needed for erectile dysfunction. 10 tablet 3   tamsulosin (FLOMAX) 0.4 MG CAPS capsule TAKE 1 CAPSULE(0.4 MG) BY MOUTH DAILY AFTER SUPPER 90 capsule 3   No facility-administered medications prior to visit.    ROS: Review of Systems  Constitutional:  Negative for appetite change, fatigue and unexpected weight change.  HENT:  Negative for congestion, nosebleeds, sneezing, sore throat and trouble swallowing.    Eyes:  Negative for itching and visual disturbance.  Respiratory:  Negative for cough.   Cardiovascular:  Negative for chest pain, palpitations and leg swelling.  Gastrointestinal:  Negative for abdominal distention, blood in stool, diarrhea and nausea.  Genitourinary:  Negative for frequency and hematuria.  Musculoskeletal:  Positive for arthralgias and back pain. Negative for gait problem, joint swelling and neck pain.  Skin:  Negative for rash.  Neurological:  Negative for dizziness, tremors, speech difficulty and weakness.  Psychiatric/Behavioral:  Negative for agitation, dysphoric mood, sleep disturbance and suicidal ideas. The patient is not nervous/anxious.    Objective:  BP 130/82 (BP Location: Left Arm)   Pulse (!) 58   Temp 97.9 F (36.6 C) (Oral)   Ht 5\' 11"  (1.803 m)   Wt 229 lb 12.8 oz (104.2 kg)   SpO2 98%   BMI 32.05 kg/m   BP Readings from Last 3 Encounters:  10/12/20 130/82  05/09/20 130/80  03/02/20 124/78    Wt Readings from Last 3 Encounters:  10/12/20 229 lb 12.8 oz (104.2 kg)  05/09/20 226 lb 3.2 oz (102.6 kg)  03/02/20 223 lb (101.2 kg)    Physical Exam Constitutional:      General: He is not in acute distress.    Appearance: He is well-developed.     Comments: NAD  Eyes:     Conjunctiva/sclera: Conjunctivae normal.     Pupils: Pupils are equal, round, and reactive to light.  Neck:     Thyroid: No thyromegaly.     Vascular: No JVD.  Cardiovascular:     Rate and Rhythm: Normal rate and regular rhythm.     Heart sounds: Normal heart sounds. No murmur heard.   No friction rub. No gallop.  Pulmonary:     Effort: Pulmonary effort is normal. No respiratory distress.     Breath sounds: Normal breath sounds. No wheezing or rales.  Chest:     Chest wall: No tenderness.  Abdominal:     General: Bowel sounds are normal. There is no distension.     Palpations: Abdomen is soft. There is no mass.     Tenderness: There is no abdominal tenderness.  There is no guarding or rebound.  Musculoskeletal:        General: No tenderness. Normal range of motion.     Cervical back: Normal range of motion.  Lymphadenopathy:     Cervical: No cervical adenopathy.  Skin:    General: Skin is warm and dry.     Findings: No rash.  Neurological:     Mental Status: He is alert and oriented to person, place, and time.     Cranial Nerves: No cranial nerve deficit.     Motor: No abnormal muscle tone.     Coordination: Coordination normal.     Gait: Gait normal.     Deep Tendon Reflexes: Reflexes are normal and symmetric.  Psychiatric:        Behavior: Behavior normal.        Thought Content: Thought content normal.        Judgment: Judgment normal.  Looks younger than 21  Lab Results  Component Value Date   WBC 3.8 (L) 05/09/2020   HGB 13.6 05/09/2020   HCT 39.8 05/09/2020   PLT 288.0 05/09/2020   GLUCOSE 106 (H) 05/09/2020   CHOL 159 05/09/2020   TRIG 113.0 05/09/2020   HDL 50.20 05/09/2020   LDLCALC 86 05/09/2020   ALT 30 05/09/2020   AST 39 (H) 05/09/2020   NA 138 05/09/2020   K 3.9 05/09/2020   CL 103 05/09/2020   CREATININE 1.35 05/09/2020   BUN 15 05/09/2020   CO2 27 05/09/2020   TSH 1.82 05/09/2020   PSA 1.72 05/09/2020   HGBA1C 6.5 03/26/2018    US Abdomen Complete  Result Date: 07/05/2020 CLINICAL DATA:  Chronic renal impairment Elevated liver function tests EXAM: ABDOMEN ULTRASOUND COMPLETE COMPARISON:  CT abdomen pelvis 07/08/2009 FINDINGS: Gallbladder: Cholelithiasis. No gallbladder wall thickening or pericholecystic fluid. Sonographic Percell Miller sign is negative per technologist. Common bile duct: Diameter: 3 mm Liver: No focal lesion identified. Within normal limits in parenchymal echogenicity. Portal vein is patent on color Doppler imaging with normal direction of blood flow towards the liver. IVC: No abnormality visualized. Pancreas: Visualized portion unremarkable. Spleen: Size and appearance within normal limits. Right  Kidney: Length: 11.8 cm. Echogenicity within normal limits. No mass or hydronephrosis visualized. Left Kidney: Length: 12.0 cm. Echogenicity within normal limits. No mass or hydronephrosis visualized. Abdominal aorta: No aneurysm visualized. Other findings: None. IMPRESSION: Cholelithiasis without additional sonographic evidence of acute cholecystitis. Electronically Signed   By: Miachel Roux M.D.   On: 07/05/2020 11:31    Assessment & Plan:   Problem List Items Addressed This Visit     Chronic renal impairment, stage 3a ([place])  Continue with good hydration, monitor GFR      Dyslipidemia    Will monitor CK. No myalgias      Elevated CK    Will monitor CK. No myalgias      Essential hypertension    Continue on Amlodipine, Losartan      Hyperglycemia    Check A1c      Myalgia    Resolved         Follow-up: Return in about 6 months (around 04/12/2021) for Wellness Exam.  Walker Kehr, MD

## 2020-10-12 NOTE — Assessment & Plan Note (Signed)
Continue with good hydration, monitor GFR

## 2020-10-12 NOTE — Assessment & Plan Note (Signed)
Check A1c. 

## 2020-10-12 NOTE — Assessment & Plan Note (Signed)
Resolved

## 2020-10-17 ENCOUNTER — Ambulatory Visit (HOSPITAL_BASED_OUTPATIENT_CLINIC_OR_DEPARTMENT_OTHER): Payer: Medicare PPO | Attending: Pulmonary Disease | Admitting: Pulmonary Disease

## 2020-10-17 ENCOUNTER — Other Ambulatory Visit: Payer: Self-pay

## 2020-10-17 DIAGNOSIS — G4733 Obstructive sleep apnea (adult) (pediatric): Secondary | ICD-10-CM

## 2020-10-18 NOTE — Procedures (Signed)
     CPAP mask desensitization.  Provided with Fisher Paykel Evora medium-wide mask and Philips Respironics P10 large pillows mask.  Provided with a chin strap also.

## 2020-11-01 DIAGNOSIS — G4733 Obstructive sleep apnea (adult) (pediatric): Secondary | ICD-10-CM | POA: Diagnosis not present

## 2020-11-09 ENCOUNTER — Ambulatory Visit: Payer: Medicare PPO | Admitting: Internal Medicine

## 2021-03-31 ENCOUNTER — Ambulatory Visit (INDEPENDENT_AMBULATORY_CARE_PROVIDER_SITE_OTHER): Payer: Medicare PPO

## 2021-03-31 DIAGNOSIS — Z0000 Encounter for general adult medical examination without abnormal findings: Secondary | ICD-10-CM | POA: Diagnosis not present

## 2021-03-31 NOTE — Progress Notes (Signed)
?I connected with Robert [name]  today by telephone and verified that I am speaking with the correct person using two identifiers. ?Location patient: home ?Location provider: work ?Persons participating in the virtual visit: patient, provider. ?  ?I discussed the limitations, risks, security and privacy concerns of performing an evaluation and management service by telephone and the availability of in person appointments. I also discussed with the patient that there may be a patient responsible charge related to this service. The patient expressed understanding and verbally consented to this telephonic visit.  ?  ?Interactive audio and video telecommunications were attempted between this provider and patient, however failed, due to patient having technical difficulties OR patient did not have access to video capability.  We continued and completed visit with audio only. ? ?Some vital signs may be absent or patient reported.  ? ?Time Spent with patient on telephone encounter: 30 minutes ? ?Subjective:  ? Robert [name] is a 71 y.o. male who presents for Medicare Annual/Subsequent preventive examination. ? ?Review of Systems    ? ?Cardiac Risk Factors include: advanced age (>22mn, >>39women);dyslipidemia;hypertension;male gender;smoking/ tobacco exposure;Other (see comment), Risk factor comments: smokes cigars ? ?   ?Objective:  ?  ?There were no vitals filed for this visit. ?There is no height or weight on file to calculate BMI. ? ? ?  03/31/2021  ?  2:08 PM 03/22/2016  ?  1:37 PM 03/12/2016  ?  8:27 AM  ?Advanced Directives  ?Does Patient Have a Medical Advance Directive? No No No  ?Would patient like information on creating a medical advance directive? No - Patient declined No - Patient declined No - Patient declined  ? ? ?Current Medications (verified) ?Outpatient Encounter Medications as of 03/31/2021  ?Medication Sig  ? ALPRAZolam (XANAX) 0.5 MG tablet Take 1 tablet (0.5 mg total) by mouth 2 (two) times daily as  needed for anxiety.  ? amLODipine (NORVASC) 10 MG tablet TAKE 1 TABLET(10 MG) BY MOUTH DAILY  ? Ascorbic Acid (VITAMIN C) 100 MG tablet Take 100 mg by mouth daily.  ? aspirin 81 MG tablet Take 81 mg by mouth daily.  ? Cholecalciferol (VITAMIN D3) 2000 units capsule Take 1 capsule (2,000 Units total) by mouth daily.  ? gemfibrozil (LOPID) 600 MG tablet TAKE 1 TABLET BY MOUTH TWICE DAILY  ? KRILL OIL PO Take by mouth.  ? levocetirizine (XYZAL) 5 MG tablet Take 1 tablet (5 mg total) by mouth every evening.  ? losartan (COZAAR) 100 MG tablet Take 1 tablet (100 mg total) by mouth daily.  ? metoprolol succinate (TOPROL-XL) 100 MG 24 hr tablet TAKE 1 TABLET(100 MG) BY MOUTH AT BEDTIME WITH OR IMMEDIATELY FOLLOWING A MEAL  ? omeprazole (PRILOSEC) 20 MG capsule TAKE 1 CAPSULE(20 MG) BY MOUTH TWICE DAILY BEFORE A MEAL  ? pravastatin (PRAVACHOL) 20 MG tablet Take 1 tablet (20 mg total) by mouth daily.  ? sildenafil (VIAGRA) 100 MG tablet Take 0.5-1 tablets (50-100 mg total) by mouth daily as needed for erectile dysfunction.  ? tamsulosin (FLOMAX) 0.4 MG CAPS capsule TAKE 1 CAPSULE(0.4 MG) BY MOUTH DAILY AFTER SUPPER  ? ?No facility-administered encounter medications on file as of 03/31/2021.  ? ? ?Allergies (verified) ?Atorvastatin  ? ?History: ?Past Medical History:  ?Diagnosis Date  ? Allergy   ? Anxiety   ? ED (erectile dysfunction)   ? GERD (gastroesophageal reflux disease)   ? History of gastritis   ? History of UTI   ? Hx of colonic polyps   ?  Hyperlipidemia   ? Hypertension   ? IBS (irritable bowel syndrome)   ? Myalgia   ? Venous insufficiency   ? ?Past Surgical History:  ?Procedure Laterality Date  ? APPENDECTOMY    ? COLON SURGERY  07/2009  ? Dr. Osborne Casco tubulovillous adenoma  ? COLONOSCOPY    ? VASECTOMY    ? ?Family History  ?Problem Relation Age of Onset  ? Colon cancer Cousin   ? Colon polyps Neg Hx   ? Pancreatic cancer Neg Hx   ? Rectal cancer Neg Hx   ? Stomach cancer Neg Hx   ? Esophageal cancer Neg Hx   ?  Liver cancer Neg Hx   ? Prostate cancer Neg Hx   ? ?Social History  ? ?Socioeconomic History  ? Marital status: Married  ?  Spouse name: Not on file  ? Number of children: Not on file  ? Years of education: Not on file  ? Highest education level: Not on file  ?Occupational History  ? 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  Employer: [employer]  ?Tobacco Use  ? Smoking status: Some Days  ?  Types: Cigars  ? Smokeless tobacco: Never  ? Tobacco comments:  ?  smokes one cigar once a month-01/27/20  ?Vaping Use  ? Vaping Use: Never used  ?Substance and Sexual Activity  ? Alcohol use: Yes  ?  Comment: social use, very occasional  ? Drug use: No  ? Sexual activity: Yes  ?Other Topics Concern  ? Not on file  ?Social History Narrative  ? Not on file  ? ?Social Determinants of Health  ? ?Financial Resource Strain: Low Risk   ? Difficulty of Paying Living Expenses: Not hard at all  ?Food Insecurity: No Food Insecurity  ? Worried About Charity fundraiser in the Last Year: Never true  ? Ran Out of Food in the Last Year: Never true  ?Transportation Needs: No Transportation Needs  ? Lack of Transportation (Medical): No  ? Lack of Transportation (Non-Medical): No  ?Physical Activity: Sufficiently Active  ? Days of Exercise per Week: 3 days  ? Minutes of Exercise per Session: 120 min  ?Stress: No Stress Concern Present  ? Feeling of Stress : Not at all  ?Social Connections: Socially Integrated  ? Frequency of Communication with Friends and Family: More than three times a week  ? Frequency of Social Gatherings with Friends and Family: More than three times a week  ? Attends Religious Services: More than 4 times per year  ? Active Member of Clubs or Organizations: Yes  ? Attends Archivist Meetings: More than 4 times per year  ? Marital Status: Married  ? ? ?Tobacco Counseling ?Ready to quit: Not Answered ?Counseling given: Not Answered ?Tobacco comments: smokes one cigar once a month-01/27/20 ? ? ?Clinical  Intake: ? ?Pre-visit preparation completed: Yes ? ?Pain : No/denies pain ? ?  ? ?Nutritional Risks: None ?Diabetes: No ? ?How often do you need to have someone help you when you read instructions, pamphlets, or other written materials from your doctor or pharmacy?: 1 - Never ?What is the last grade level you completed in school?: Bachelor's Degree ? ?Diabetic? no ? ?Interpreter Needed?: No ? ?Information entered by :: Lisette Abu, LPN ? ? ?Activities of Daily Living ? ?  03/31/2021  ?  2:21 PM  ?In your present state of health, do you have any difficulty performing the following activities:  ?Hearing? 0  ?Vision? 0  ?Difficulty  concentrating or making decisions? 0  ?Walking or climbing stairs? 0  ?Dressing or bathing? 0  ?Doing errands, shopping? 0  ?Preparing Food and eating ? N  ?Using the Toilet? N  ?In the past six months, have you accidently leaked urine? N  ?Do you have problems with loss of bowel control? N  ?Managing your Medications? N  ?Managing your Finances? N  ?Housekeeping or managing your Housekeeping? N  ? ? ?Patient Care Team: ?Plotnikov, Evie Lacks, MD as PCP - General (Internal Medicine) ?Laurence Aly, OD as Consulting Physician (Optometry) ? ?Indicate any recent Medical Services you may have received from other than [facility] providers in the past year (date may be approximate). ? ?   ?Assessment:  ? This is a routine wellness examination for Robert [name]. ? ?Hearing/Vision screen ?Hearing Screening - Comments:: Patient denied any hearing difficulty.   ?No hearing aids. ? ?Vision Screening - Comments:: Patient does wear corrective lenses/contacts.  ?Eye exam done by: [facility] ? ? ?Dietary issues and exercise activities discussed: ?Current Exercise Habits: Home exercise routine;Structured exercise class, Type of exercise: walking;treadmill;stretching;strength training/weights, Time (Minutes): 60, Frequency (Times/Week): 3, Weekly Exercise (Minutes/Week): 180, Intensity: Moderate ? ?  Goals Addressed   ? ?  ?  ?  ?  ?  ? This Visit's Progress  ?   Patient Stated (pt-stated)     ?   My goal is to increase my physical activity by walking more. ?  ? ?  ?Depression Screen ? ?  03/31/2021  ?  2:10 PM 05/10/18

## 2021-04-04 ENCOUNTER — Telehealth: Payer: Self-pay | Admitting: Internal Medicine

## 2021-04-04 NOTE — Telephone Encounter (Signed)
Pt requesting a cb regarding 3-31 appt ?

## 2021-04-10 ENCOUNTER — Ambulatory Visit: Payer: Medicare PPO | Admitting: Internal Medicine

## 2021-04-10 ENCOUNTER — Encounter: Payer: Self-pay | Admitting: Internal Medicine

## 2021-04-10 DIAGNOSIS — R600 Localized edema: Secondary | ICD-10-CM

## 2021-04-10 DIAGNOSIS — I872 Venous insufficiency (chronic) (peripheral): Secondary | ICD-10-CM | POA: Diagnosis not present

## 2021-04-10 DIAGNOSIS — N1831 Chronic kidney disease, stage 3a: Secondary | ICD-10-CM | POA: Diagnosis not present

## 2021-04-10 DIAGNOSIS — R739 Hyperglycemia, unspecified: Secondary | ICD-10-CM

## 2021-04-10 DIAGNOSIS — I10 Essential (primary) hypertension: Secondary | ICD-10-CM

## 2021-04-10 LAB — COMPREHENSIVE METABOLIC PANEL
ALT: 44 U/L (ref 0–53)
AST: 48 U/L — ABNORMAL HIGH (ref 0–37)
Albumin: 4.6 g/dL (ref 3.5–5.2)
Alkaline Phosphatase: 95 U/L (ref 39–117)
BUN: 10 mg/dL (ref 6–23)
CO2: 28 mEq/L (ref 19–32)
Calcium: 10.3 mg/dL (ref 8.4–10.5)
Chloride: 102 mEq/L (ref 96–112)
Creatinine, Ser: 1.19 mg/dL (ref 0.40–1.50)
GFR: 61.57 mL/min (ref >60.00)
Glucose, Bld: 104 mg/dL — ABNORMAL HIGH (ref 70–99)
Potassium: 3.6 mEq/L (ref 3.5–5.1)
Sodium: 138 mEq/L (ref 135–145)
Total Bilirubin: 0.5 mg/dL (ref 0.2–1.2)
Total Protein: 7.8 g/dL (ref 6.0–8.3)

## 2021-04-10 LAB — HEMOGLOBIN A1C: Hgb A1c MFr Bld: 6.3 % (ref 4.6–6.5)

## 2021-04-10 NOTE — Progress Notes (Signed)
? ?Subjective:  ?Patient ID: Robert [name], male    DOB: 01-26-1950  Age: 71 y.o. MRN: [id number] ? ?CC: Bilateral Ankle Edema (Has been ongoing for the last couple of months, sometimes he does keep his feet elevated when possible. ) ? ? ?HPI ?Robert [name] presents for ankle edema - chronic x many years- not worse.  ?F/u HTN, dyslipidemia ? ? ?Outpatient Medications Prior to Visit  ?Medication Sig Dispense Refill  ? ALPRAZolam (XANAX) 0.5 MG tablet Take 1 tablet (0.5 mg total) by mouth 2 (two) times daily as needed for anxiety. 180 tablet 1  ? amLODipine (NORVASC) 10 MG tablet TAKE 1 TABLET(10 MG) BY MOUTH DAILY 90 tablet 3  ? Ascorbic Acid (VITAMIN C) 100 MG tablet Take 100 mg by mouth daily.    ? aspirin 81 MG tablet Take 81 mg by mouth daily.    ? Cholecalciferol (VITAMIN D3) 2000 units capsule Take 1 capsule (2,000 Units total) by mouth daily. 100 capsule 3  ? gemfibrozil (LOPID) 600 MG tablet TAKE 1 TABLET BY MOUTH TWICE DAILY 180 tablet 3  ? KRILL OIL PO Take by mouth.    ? levocetirizine (XYZAL) 5 MG tablet Take 1 tablet (5 mg total) by mouth every evening. 90 tablet 3  ? losartan (COZAAR) 100 MG tablet Take 1 tablet (100 mg total) by mouth daily. 90 tablet 3  ? metoprolol succinate (TOPROL-XL) 100 MG 24 hr tablet TAKE 1 TABLET(100 MG) BY MOUTH AT BEDTIME WITH OR IMMEDIATELY FOLLOWING A MEAL 90 tablet 3  ? omeprazole (PRILOSEC) 20 MG capsule TAKE 1 CAPSULE(20 MG) BY MOUTH TWICE DAILY BEFORE A MEAL 180 capsule 3  ? pravastatin (PRAVACHOL) 20 MG tablet Take 1 tablet (20 mg total) by mouth daily. 30 tablet 11  ? sildenafil (VIAGRA) 100 MG tablet Take 0.5-1 tablets (50-100 mg total) by mouth daily as needed for erectile dysfunction. 20 tablet 5  ? tamsulosin (FLOMAX) 0.4 MG CAPS capsule TAKE 1 CAPSULE(0.4 MG) BY MOUTH DAILY AFTER SUPPER 90 capsule 3  ? ?No facility-administered medications prior to visit.  ? ? ?ROS: ?Review of Systems ? ?Objective:  ?BP 134/80   Pulse (!) 56   Temp 98 ?F (36.7 ?C) (Oral)   Ht 5'  11" (1.803 m)   Wt 233 lb 9.6 oz (106 kg)   SpO2 98%   BMI 32.58 kg/m?  ? ?BP Readings from Last 3 Encounters:  ?04/10/21 134/80  ?10/12/20 130/82  ?05/09/20 130/80  ? ? ?Wt Readings from Last 3 Encounters:  ?04/10/21 233 lb 9.6 oz (106 kg)  ?10/12/20 229 lb 12.8 oz (104.2 kg)  ?05/09/20 226 lb 3.2 oz (102.6 kg)  ? ? ?Physical Exam ? ?Lab Results  ?Component Value Date  ? WBC 3.8 (L) 05/09/2020  ? HGB 13.6 05/09/2020  ? HCT 39.8 05/09/2020  ? PLT 288.0 05/09/2020  ? GLUCOSE 111 (H) 10/12/2020  ? CHOL 209 (H) 10/12/2020  ? TRIG 237.0 (H) 10/12/2020  ? HDL 44.00 10/12/2020  ? LDLDIRECT 47.0 10/12/2020  ? [place] 86 05/09/2020  ? ALT 55 (H) 10/12/2020  ? AST 56 (H) 10/12/2020  ? NA 138 10/12/2020  ? K 3.7 10/12/2020  ? CL 105 10/12/2020  ? CREATININE 1.29 10/12/2020  ? BUN 15 10/12/2020  ? CO2 27 10/12/2020  ? TSH 1.82 05/09/2020  ? PSA 1.72 05/09/2020  ? HGBA1C 6.3 10/12/2020  ? ? ?US Abdomen Complete ? ?Result Date: 07/05/2020 ?CLINICAL DATA:  Chronic renal impairment Elevated liver function tests EXAM: ABDOMEN  ULTRASOUND COMPLETE COMPARISON:  CT abdomen pelvis 07/08/2009 FINDINGS: Gallbladder: Cholelithiasis. No gallbladder wall thickening or pericholecystic fluid. Sonographic Percell Miller sign is negative per technologist. Common bile duct: Diameter: 3 mm Liver: No focal lesion identified. Within normal limits in parenchymal echogenicity. Portal vein is patent on color Doppler imaging with normal direction of blood flow towards the liver. IVC: No abnormality visualized. Pancreas: Visualized portion unremarkable. Spleen: Size and appearance within normal limits. Right Kidney: Length: 11.8 cm. Echogenicity within normal limits. No mass or hydronephrosis visualized. Left Kidney: Length: 12.0 cm. Echogenicity within normal limits. No mass or hydronephrosis visualized. Abdominal aorta: No aneurysm visualized. Other findings: None. IMPRESSION: Cholelithiasis without additional sonographic evidence of acute cholecystitis.  Electronically Signed   By: Miachel Roux M.D.   On: 07/05/2020 11:31  ? ? ?Assessment & Plan:  ? ?Problem List Items Addressed This Visit   ? ? Essential hypertension  ?  Likely Norvasc 10 mg related ankle edema - chronic x many years- not worse. OK to hold x 1 wk to see if resolved ?  ?  ? Relevant Orders  ? Comprehensive metabolic panel  ? Chronic venous insufficiency  ?  Likely Norvasc 10 mg related ankle edema - chronic x many years- not worse. OK to hold x 1 wk to see if resolved ?  ?  ? Edema  ?  Likely Norvasc 10 mg related ankle edema - chronic x many years- not worse. OK to hold x 1 wk to see if resolved ?  ?  ? Chronic renal impairment, stage 3a ([place])  ?  Continue with good hydration, monitor GFR ?  ?  ? Hyperglycemia  ?  Check A1c ?  ?  ? Relevant Orders  ? Comprehensive metabolic panel  ? Hemoglobin A1c  ?  ? ? ?No orders of the defined types were placed in this encounter. ?  ? ? ?Follow-up: Return in about 6 months (around 10/10/2021) for Wellness Exam. ? ?Walker Kehr, MD ?

## 2021-04-10 NOTE — Assessment & Plan Note (Signed)
Likely Norvasc 10 mg related ankle edema - chronic x many years- not worse. OK to hold x 1 wk to see if resolved ?

## 2021-04-10 NOTE — Assessment & Plan Note (Signed)
Check A1c. 

## 2021-04-10 NOTE — Assessment & Plan Note (Signed)
Continue with good hydration, monitor GFR ?

## 2021-04-17 MED ORDER — GEMFIBROZIL 600 MG PO TABS: ORAL_TABLET | ORAL | 3 refills | Status: DC

## 2021-05-04 ENCOUNTER — Ambulatory Visit: Payer: Medicare PPO | Admitting: Internal Medicine

## 2021-06-03 ENCOUNTER — Other Ambulatory Visit: Payer: Self-pay | Admitting: Internal Medicine

## 2021-06-25 ENCOUNTER — Other Ambulatory Visit: Payer: Self-pay | Admitting: Internal Medicine

## 2021-07-04 ENCOUNTER — Other Ambulatory Visit: Payer: Self-pay | Admitting: Internal Medicine

## 2021-07-27 ENCOUNTER — Emergency Department (HOSPITAL_BASED_OUTPATIENT_CLINIC_OR_DEPARTMENT_OTHER)
Admission: EM | Admit: 2021-07-27 | Discharge: 2021-07-27 | Disposition: A | Discharge status: Home / Self Care | Payer: Medicare PPO | Attending: Emergency Medicine | Admitting: Emergency Medicine

## 2021-07-27 ENCOUNTER — Other Ambulatory Visit: Payer: Self-pay

## 2021-07-27 ENCOUNTER — Encounter (HOSPITAL_BASED_OUTPATIENT_CLINIC_OR_DEPARTMENT_OTHER): Payer: Self-pay | Admitting: Emergency Medicine

## 2021-07-27 ENCOUNTER — Emergency Department (HOSPITAL_BASED_OUTPATIENT_CLINIC_OR_DEPARTMENT_OTHER): Payer: Medicare PPO

## 2021-07-27 DIAGNOSIS — Z7982 Long term (current) use of aspirin: Secondary | ICD-10-CM | POA: Diagnosis not present

## 2021-07-27 DIAGNOSIS — I10 Essential (primary) hypertension: Secondary | ICD-10-CM | POA: Diagnosis not present

## 2021-07-27 DIAGNOSIS — M542 Cervicalgia: Secondary | ICD-10-CM | POA: Diagnosis not present

## 2021-07-27 DIAGNOSIS — M436 Torticollis: Secondary | ICD-10-CM | POA: Insufficient documentation

## 2021-07-27 DIAGNOSIS — I6523 Occlusion and stenosis of bilateral carotid arteries: Secondary | ICD-10-CM | POA: Diagnosis not present

## 2021-07-27 DIAGNOSIS — Z79899 Other long term (current) drug therapy: Secondary | ICD-10-CM | POA: Insufficient documentation

## 2021-07-27 LAB — TROPONIN I (HIGH SENSITIVITY)
Troponin I (High Sensitivity): 11 ng/L (ref <18)
Troponin I (High Sensitivity): 14 ng/L (ref <18)

## 2021-07-27 LAB — BASIC METABOLIC PANEL
Anion gap: 11 (ref 5–15)
BUN: 11 mg/dL (ref 8–23)
CO2: 25 mmol/L (ref 22–32)
Calcium: 10.5 mg/dL — ABNORMAL HIGH (ref 8.9–10.3)
Chloride: 103 mmol/L (ref 98–111)
Creatinine, Ser: 1.02 mg/dL (ref 0.61–1.24)
GFR, Estimated: >60 mL/min (ref >60)
Glucose, Bld: 98 mg/dL (ref 70–99)
Potassium: 3.9 mmol/L (ref 3.5–5.1)
Sodium: 139 mmol/L (ref 135–145)

## 2021-07-27 LAB — CBC WITH DIFFERENTIAL/PLATELET
Abs Immature Granulocytes: 0.01 10*3/uL (ref 0.00–0.07)
Basophils Absolute: 0 10*3/uL (ref 0.0–0.1)
Basophils Relative: 1 %
Eosinophils Absolute: 0.3 10*3/uL (ref 0.0–0.5)
Eosinophils Relative: 8 %
HCT: 41.3 % (ref 39.0–52.0)
Hemoglobin: 14 g/dL (ref 13.0–17.0)
Immature Granulocytes: 0 %
Lymphocytes Relative: 39 %
Lymphs Abs: 1.5 10*3/uL (ref 0.7–4.0)
MCH: 27.8 pg (ref 26.0–34.0)
MCHC: 33.9 g/dL (ref 30.0–36.0)
MCV: 81.9 fL (ref 80.0–100.0)
Monocytes Absolute: 0.4 10*3/uL (ref 0.1–1.0)
Monocytes Relative: 10 %
Neutro Abs: 1.6 10*3/uL — ABNORMAL LOW (ref 1.7–7.7)
Neutrophils Relative %: 42 %
Platelets: 213 10*3/uL (ref 150–400)
RBC: 5.04 MIL/uL (ref 4.22–5.81)
RDW: 14 % (ref 11.5–15.5)
WBC: 3.7 10*3/uL — ABNORMAL LOW (ref 4.0–10.5)
nRBC: 0 % (ref 0.0–0.2)

## 2021-07-27 MED ORDER — IOHEXOL 350 MG/ML SOLN
100.0000 mL | Freq: Once | INTRAVENOUS | Status: AC | PRN
  Administered 2021-07-27: 75 mL via INTRAVENOUS

## 2021-07-27 MED ORDER — METHOCARBAMOL 500 MG PO TABS: 500.0000 mg | ORAL_TABLET | Freq: Three times a day (TID) | ORAL | 0 refills | Status: DC | PRN

## 2021-07-27 MED ORDER — KETOROLAC TROMETHAMINE 30 MG/ML IJ SOLN
15.0000 mg | Freq: Once | INTRAMUSCULAR | Status: AC
  Administered 2021-07-27: 15 mg via INTRAVENOUS
  Filled 2021-07-27: qty 1

## 2021-07-27 MED ORDER — FENTANYL CITRATE PF 50 MCG/ML IJ SOSY
50.0000 ug | PREFILLED_SYRINGE | Freq: Once | INTRAMUSCULAR | Status: AC
  Administered 2021-07-27: 50 ug via INTRAVENOUS
  Filled 2021-07-27: qty 1

## 2021-07-27 MED ORDER — DIAZEPAM 2 MG PO TABS
2.0000 mg | ORAL_TABLET | Freq: Once | ORAL | Status: AC
  Administered 2021-07-27: 2 mg via ORAL
  Filled 2021-07-27: qty 1

## 2021-07-27 NOTE — ED Triage Notes (Signed)
Pt here from home with c/o left sided neck pain ,that started this morning , no trauma noted

## 2021-07-27 NOTE — ED Provider Notes (Signed)
Patient was initially seen by Dr. Kingsley Callander.  Please see his note.  Plan was to follow-up on the patient's CT scan and second troponin.  Second troponin does not show any significant increase at 14.  CT scan does not show any acute findings regarding his neck pain.  Clinical Course as of 07/27/21 1808  Thu Jul 27, 2021  1649 CT ANGIO HEAD NECK W WO CM CT scan does not show any evidence of any acute abnormality [JK]    Clinical Course User Index [JK] Dorie Rank, MD   Pain most likely musculoskeletal in nature.  Patient also noted to be hypertensive and we will have him continue his home medications and follow-up with his primary doctor.  It is possible some of the blood pressure elevation is related to the pain he is experiencing   Dorie Rank, MD 07/27/21 [phone]

## 2021-07-27 NOTE — Discharge Instructions (Addendum)
Testing today is reassuring. No evidence of heart attack or stroke. Take the muscle relaxers as prescribed. Do not take them if you are working or driving as they may be sedating. Followup with your doctor. Return to the ED with worsening pain, weakness or numbness in arm, severe headache, chest pain  or any other concerns.   Your blood pressure was also  elevated today.  Sometimes that can be related to the pain you are experiencing.  Follow-up with your primary care doctor to have that rechecked.

## 2021-07-27 NOTE — ED Notes (Signed)
RN provided AVS using Teachback Method. Patient verbalizes understanding of Discharge Instructions. Opportunity for Questioning and Answers were provided by RN. Patient Discharged from ED with Family to Home.

## 2021-07-27 NOTE — ED Provider Notes (Signed)
[facility] Provider Note   CSN: [id number] Arrival date & time: 07/27/21  1118     History  Chief Complaint  Patient presents with   Torticollis    Robert [name] is a 71 y.o. male.  Patient with a history of hypertension and high cholesterol presenting with left-sided neck pain that he woke up with this morning.  Denies any trauma or injury.  States he had some discomfort in his neck earlier in the week on and off but did not bother him significantly.  This morning he has severe pain of his left neck with difficulty ranging his head to the left.  Denies any trauma.  Denies any focal weakness, numbness or tingling.  No chest pain or shortness of breath.  No fever.  Taking ibuprofen at home without relief.  Denies any headache.  Denies any difficulty breathing or difficulty swallowing.  The history is provided by the patient and a relative.       Home Medications Prior to Admission medications   Medication Sig Start Date End Date Taking? Authorizing Provider  ALPRAZolam Duanne Moron) 0.5 MG tablet Take 1 tablet (0.5 mg total) by mouth 2 (two) times daily as needed for anxiety. 03/26/18   Plotnikov, Evie Lacks, MD  amLODipine (NORVASC) 10 MG tablet TAKE 1 TABLET(10 MG) BY MOUTH DAILY 05/24/20   Plotnikov, Evie Lacks, MD  Ascorbic Acid (VITAMIN C) 100 MG tablet Take 100 mg by mouth daily.    [provider]  aspirin 81 MG tablet Take 81 mg by mouth daily.    [provider]  Cholecalciferol (VITAMIN D3) 2000 units capsule Take 1 capsule (2,000 Units total) by mouth daily. 03/12/16   Plotnikov, Evie Lacks, MD  gemfibrozil (LOPID) 600 MG tablet TAKE 1 TABLET BY MOUTH TWICE DAILY 04/17/21   Plotnikov, Evie Lacks, MD  KRILL OIL PO Take by mouth.    [provider]  levocetirizine (XYZAL) 5 MG tablet Take 1 tablet (5 mg total) by mouth every evening. 05/24/20   Plotnikov, Evie Lacks, MD  losartan (COZAAR) 100 MG tablet TAKE 1 TABLET(100 MG) BY MOUTH  DAILY 06/05/21   Plotnikov, Evie Lacks, MD  metoprolol succinate (TOPROL-XL) 100 MG 24 hr tablet TAKE 1/2 TABLET BY MOUTH EVERY NIGHT AT BEDTIME, TAKE WITH OR IMMEDIATELY FOLLOWING A MEAL 06/05/21   Plotnikov, Evie Lacks, MD  omeprazole (PRILOSEC) 20 MG capsule TAKE 1 CAPSULE(20 MG) BY MOUTH TWICE DAILY BEFORE A MEAL 08/02/20   Plotnikov, Evie Lacks, MD  pravastatin (PRAVACHOL) 20 MG tablet Take 1 tablet (20 mg total) by mouth daily. Annual appt due in Oct must see provider for future refills 07/05/21   Plotnikov, Evie Lacks, MD  sildenafil (VIAGRA) 100 MG tablet Take 0.5-1 tablets (50-100 mg total) by mouth daily as needed for erectile dysfunction. 10/12/20   Plotnikov, Evie Lacks, MD  tamsulosin (FLOMAX) 0.4 MG CAPS capsule TAKE 1 CAPSULE BY MOUTH EVERY DAY AFTER SUPPER 06/26/21   Plotnikov, Evie Lacks, MD      Allergies    Atorvastatin    Review of Systems   Review of Systems  Physical Exam Updated Vital Signs BP (!) 183/80 (BP Location: Right Arm)   Pulse (!) 50   Temp 98.3 F (36.8 C)   Resp 16   Ht '5\' 11"'$  (1.803 m)   Wt 99.3 kg   SpO2 99%   BMI 30.54 kg/m  Physical Exam Vitals and nursing note reviewed.  Constitutional:      General: He  is not in acute distress.    Appearance: He is well-developed.  HENT:     Head: Normocephalic and atraumatic.     Mouth/Throat:     Pharynx: No oropharyngeal exudate.  Eyes:     Conjunctiva/sclera: Conjunctivae normal.     Pupils: Pupils are equal, round, and reactive to light.  Neck:     Comments: Holding left neck posteriorly.  Able to turn to the right and the left with some discomfort.  No meningismus.  Swallowing secretions. Cardiovascular:     Rate and Rhythm: Normal rate and regular rhythm.     Heart sounds: Normal heart sounds. No murmur heard. Pulmonary:     Effort: Pulmonary effort is normal. No respiratory distress.     Breath sounds: Normal breath sounds.  Abdominal:     Palpations: Abdomen is soft.     Tenderness: There is no  abdominal tenderness. There is no guarding or rebound.  Musculoskeletal:        General: No tenderness. Normal range of motion.     Cervical back: Neck supple.  Skin:    General: Skin is warm.  Neurological:     Mental Status: He is alert and oriented to person, place, and time.     Cranial Nerves: No cranial nerve deficit.     Motor: No abnormal muscle tone.     Coordination: Coordination normal.     Comments:  5/5 strength throughout. CN 2-12 intact.Equal grip strength.   Psychiatric:        Behavior: Behavior normal.     ED Results / Procedures / Treatments   Labs (all labs ordered are listed, but only abnormal results are displayed) Labs Reviewed  CBC WITH DIFFERENTIAL/PLATELET - Abnormal; Notable for the following components:      Result Value   WBC 3.7 (*)    Neutro Abs 1.6 (*)    All other components within normal limits  BASIC METABOLIC PANEL - Abnormal; Notable for the following components:   Calcium 10.5 (*)    All other components within normal limits  TROPONIN I (HIGH SENSITIVITY)  TROPONIN I (HIGH SENSITIVITY)    EKG EKG Interpretation  Date/Time:  Thursday July 27 2021 14:07:11 EDT Ventricular Rate:  53 PR Interval:  164 QRS Duration: 93 QT Interval:  428 QTC Calculation: 402 R Axis:   46 Text Interpretation: Sinus rhythm Borderline ST elevation, anterior leads No significant change was found Confirmed by Ezequiel Essex [phone]) on 07/27/2021 2:24:14 PM  Radiology No results found.  Procedures Procedures    Medications Ordered in ED Medications  ketorolac (TORADOL) 30 MG/ML injection 15 mg (has no administration in time range)  fentaNYL (SUBLIMAZE) injection 50 mcg (has no administration in time range)  diazepam (VALIUM) tablet 2 mg (has no administration in time range)    ED Course/ Medical Decision Making/ A&P                           Medical Decision Making Amount and/or Complexity of Data Reviewed Labs: ordered. Decision-making  details documented in ED Course. Radiology: ordered and independent interpretation performed. Decision-making details documented in ED Course. ECG/medicine tests: ordered and independent interpretation performed. Decision-making details documented in ED Course.  Risk Prescription drug management.  Left-sided neck pain since this morning.  No neurological deficits.  No fever.  Patient severely uncomfortable and hypertensive.  Imaging will be obtained to evaluate for vascular dissection or aneurysm  CTA is pending at  shift change.  Patient feels improved after receiving Valium and muscle relaxers.  He is able to rotate his head without difficulty but is still having some spasms and discomfort.  Low suspicion for meningitis and he has no meningismus or fever.  Serial troponin is pending.  CTA pending to evaluate for carotid or vertebral aneurysm or dissection.  Suspect likely torticollis which will be treated supportively.  Dr. Tomi Bamberger to assume care at shift change        Final Clinical Impression(s) / ED Diagnoses Final diagnoses:  Torticollis    Rx / DC Orders ED Discharge Orders     None         Yulonda Wheeling, Annie Main, MD 07/27/21 1542

## 2021-08-05 ENCOUNTER — Other Ambulatory Visit: Payer: Self-pay | Admitting: Internal Medicine

## 2021-08-15 ENCOUNTER — Other Ambulatory Visit: Payer: Self-pay | Admitting: Internal Medicine

## 2021-08-21 ENCOUNTER — Ambulatory Visit: Payer: Medicare PPO | Admitting: Internal Medicine

## 2021-08-21 ENCOUNTER — Encounter: Payer: Self-pay | Admitting: Internal Medicine

## 2021-08-21 DIAGNOSIS — I10 Essential (primary) hypertension: Secondary | ICD-10-CM

## 2021-08-21 DIAGNOSIS — N529 Male erectile dysfunction, unspecified: Secondary | ICD-10-CM | POA: Diagnosis not present

## 2021-08-21 DIAGNOSIS — K219 Gastro-esophageal reflux disease without esophagitis: Secondary | ICD-10-CM | POA: Diagnosis not present

## 2021-08-21 DIAGNOSIS — R600 Localized edema: Secondary | ICD-10-CM | POA: Diagnosis not present

## 2021-08-21 MED ORDER — SILDENAFIL CITRATE 100 MG PO TABS: 50.0000 mg | ORAL_TABLET | Freq: Every day | ORAL | 5 refills | Status: DC | PRN

## 2021-08-21 MED ORDER — OLMESARTAN-AMLODIPINE-HCTZ 40-5-12.5 MG PO TABS: 1.0000 | ORAL_TABLET | ORAL | 3 refills | Status: DC

## 2021-08-21 MED ORDER — METOPROLOL SUCCINATE ER 100 MG PO TB24
100.0000 mg | ORAL_TABLET | Freq: Every day | ORAL | 3 refills | Status: DC
Start: 2021-08-21 — End: 2022-09-10

## 2021-08-21 MED ORDER — OMEPRAZOLE 20 MG PO CPDR
20.0000 mg | DELAYED_RELEASE_CAPSULE | Freq: Two times a day (BID) | ORAL | 3 refills | Status: DC
Start: 2021-08-21 — End: 2021-11-13

## 2021-08-21 NOTE — Progress Notes (Addendum)
Subjective:  Patient ID: Robert [name], male    DOB: 19-Mar-1950  Age: 71 y.o. MRN: [id number]  CC: Medication Problem (Want to make sure what BP med is to take 1/2. Was taking Amlodipine pharmacy told him he suppose take 1/2 Metoprolol )   HPI Robert [name] presents for HTN, GERD, ED. C/o edema  Outpatient Medications Prior to Visit  Medication Sig Dispense Refill   ALPRAZolam (XANAX) 0.5 MG tablet Take 1 tablet (0.5 mg total) by mouth 2 (two) times daily as needed for anxiety. 180 tablet 1   Ascorbic Acid (VITAMIN C) 100 MG tablet Take 100 mg by mouth daily.     aspirin 81 MG tablet Take 81 mg by mouth daily.     Cholecalciferol (VITAMIN D3) 2000 units capsule Take 1 capsule (2,000 Units total) by mouth daily. 100 capsule 3   gemfibrozil (LOPID) 600 MG tablet TAKE 1 TABLET BY MOUTH TWICE DAILY 180 tablet 3   KRILL OIL PO Take by mouth.     levocetirizine (XYZAL) 5 MG tablet TAKE 1 TABLET(5 MG) BY MOUTH EVERY EVENING 90 tablet 3   methocarbamol (ROBAXIN) 500 MG tablet Take 1 tablet (500 mg total) by mouth every 8 (eight) hours as needed for muscle spasms. 20 tablet 0   pravastatin (PRAVACHOL) 20 MG tablet Take 1 tablet (20 mg total) by mouth daily. Annual appt due in Oct must see provider for future refills 90 tablet 0   tamsulosin (FLOMAX) 0.4 MG CAPS capsule TAKE 1 CAPSULE BY MOUTH EVERY DAY AFTER SUPPER 90 capsule 1   amLODipine (NORVASC) 10 MG tablet TAKE 1 TABLET(10 MG) BY MOUTH DAILY Annual appt due in Oct must see provider for future refills 90 tablet 0   losartan (COZAAR) 100 MG tablet TAKE 1 TABLET(100 MG) BY MOUTH DAILY 90 tablet 3   metoprolol succinate (TOPROL-XL) 100 MG 24 hr tablet TAKE 1/2 TABLET BY MOUTH EVERY NIGHT AT BEDTIME, TAKE WITH OR IMMEDIATELY FOLLOWING A MEAL 90 tablet 3   omeprazole (PRILOSEC) 20 MG capsule Take 1 capsule (20 mg total) by mouth 2 (two) times daily before a meal. Annual appt due in Oct must see provider for future refills 180 capsule 0   omeprazole  (PRILOSEC) 20 MG capsule Take 1 capsule (20 mg total) by mouth 2 (two) times daily before a meal. Annual appt due in Oct must see provider for future refills 180 capsule 0   sildenafil (VIAGRA) 100 MG tablet Take 0.5-1 tablets (50-100 mg total) by mouth daily as needed for erectile dysfunction. 20 tablet 5   No facility-administered medications prior to visit.    ROS: Review of Systems  Constitutional:  Negative for appetite change, fatigue and unexpected weight change.  HENT:  Negative for congestion, nosebleeds, sneezing, sore throat and trouble swallowing.   Eyes:  Negative for itching and visual disturbance.  Respiratory:  Negative for cough.   Cardiovascular:  Positive for leg swelling. Negative for chest pain and palpitations.  Gastrointestinal:  Negative for abdominal distention, blood in stool, diarrhea and nausea.  Genitourinary:  Negative for frequency and hematuria.  Musculoskeletal:  Negative for back pain, gait problem, joint swelling and neck pain.  Skin:  Negative for rash.  Neurological:  Negative for dizziness, tremors, speech difficulty and weakness.  Psychiatric/Behavioral:  Negative for agitation, dysphoric mood and sleep disturbance. The patient is not nervous/anxious.     Objective:  BP 138/82 (BP Location: Left Arm)   Pulse (!) 58   Temp 98 F (36.7  C) (Oral)   Ht '5\' 11"'$  (1.803 m)   Wt 233 lb 12.8 oz (106.1 kg)   SpO2 99%   BMI 32.61 kg/m   BP Readings from Last 3 Encounters:  08/21/21 138/82  07/27/21 (!) 190/83  04/10/21 134/80    Wt Readings from Last 3 Encounters:  08/21/21 233 lb 12.8 oz (106.1 kg)  07/27/21 219 lb (99.3 kg)  04/10/21 233 lb 9.6 oz (106 kg)    Physical Exam Constitutional:      General: He is not in acute distress.    Appearance: Normal appearance. He is well-developed.     Comments: NAD  Eyes:     Conjunctiva/sclera: Conjunctivae normal.     Pupils: Pupils are equal, round, and reactive to light.  Neck:     Thyroid: No  thyromegaly.     Vascular: No JVD.  Cardiovascular:     Rate and Rhythm: Normal rate and regular rhythm.     Heart sounds: Normal heart sounds. No murmur heard.    No friction rub. No gallop.  Pulmonary:     Effort: Pulmonary effort is normal. No respiratory distress.     Breath sounds: Normal breath sounds. No wheezing or rales.  Chest:     Chest wall: No tenderness.  Abdominal:     General: Bowel sounds are normal. There is no distension.     Palpations: Abdomen is soft. There is no mass.     Tenderness: There is no abdominal tenderness. There is no guarding or rebound.  Musculoskeletal:        General: No tenderness. Normal range of motion.     Cervical back: Normal range of motion.     Right lower leg: Edema present.     Left lower leg: Edema present.  Lymphadenopathy:     Cervical: No cervical adenopathy.  Skin:    General: Skin is warm and dry.     Findings: No rash.  Neurological:     Mental Status: He is alert and oriented to person, place, and time.     Cranial Nerves: No cranial nerve deficit.     Motor: No abnormal muscle tone.     Coordination: Coordination normal.     Gait: Gait normal.     Deep Tendon Reflexes: Reflexes are normal and symmetric.  Psychiatric:        Behavior: Behavior normal.        Thought Content: Thought content normal.        Judgment: Judgment normal.   Trace to 1+ ankle edema B  Lab Results  Component Value Date   WBC 3.7 (L) 07/27/2021   HGB 14.0 07/27/2021   HCT 41.3 07/27/2021   PLT 213 07/27/2021   GLUCOSE 98 07/27/2021   CHOL 209 (H) 10/12/2020   TRIG 237.0 (H) 10/12/2020   HDL 44.00 10/12/2020   LDLDIRECT 47.0 10/12/2020   LDLCALC 86 05/09/2020   ALT 44 04/10/2021   AST 48 (H) 04/10/2021   NA 139 07/27/2021   K 3.9 07/27/2021   CL 103 07/27/2021   CREATININE 1.02 07/27/2021   BUN 11 07/27/2021   CO2 25 07/27/2021   TSH 1.82 05/09/2020   PSA 1.72 05/09/2020   HGBA1C 6.3 04/10/2021    CT ANGIO HEAD NECK W WO  CM  Result Date: 07/27/2021 CLINICAL DATA:  Neck trauma, impaired range of motion. EXAM: CT ANGIOGRAPHY HEAD AND NECK TECHNIQUE: Multidetector CT imaging of the head and neck was performed using the standard protocol  during bolus administration of intravenous contrast. Multiplanar CT image reconstructions and MIPs were obtained to evaluate the vascular anatomy. Carotid stenosis measurements (when applicable) are obtained utilizing NASCET criteria, using the distal internal carotid diameter as the denominator. RADIATION DOSE REDUCTION: This exam was performed according to the departmental dose-optimization program which includes automated exposure control, adjustment of the mA and/or kV according to patient size and/or use of iterative reconstruction technique. CONTRAST:  66m OMNIPAQUE IOHEXOL 350 MG/ML SOLN COMPARISON:  Head CT April 26, 2003. FINDINGS: CT HEAD FINDINGS Brain: No evidence of acute infarction, hemorrhage, hydrocephalus, extra-axial collection or mass lesion/mass effect. Vascular: No hyperdense vessel or unexpected calcification. Skull: Normal. Negative for fracture or focal lesion. Sinuses: Mucosal thickening with bubbly secretion within the bilateral ethmoid cells and frontal sinuses. Orbits: No acute finding. Review of the MIP images confirms the above findings CTA NECK FINDINGS Aortic arch: Standard branching. Imaged portion shows no evidence of aneurysm or dissection. No significant stenosis of the major arch vessel origins. Right carotid system: Mild atherosclerotic changes in the right carotid bifurcation without hemodynamically significant stenosis. Left carotid system: Mild atherosclerotic changes in the left carotid bifurcation without hemodynamically significant stenosis. Vertebral arteries: Evaluation of the V1 segment of the left vertebral artery is precluded by artifact from dense contrast in adjacent vein. The most distal portion of the left A1 segment and the remainder of the left  vertebral artery showed no evidence of occlusion or stenosis. The right vertebral artery has normal course and caliber throughout its entire course. The Skeleton: No acute or aggressive process identified. Other neck: Negative. Upper chest: Negative Review of the MIP images confirms the above findings CTA HEAD FINDINGS Anterior circulation: No significant stenosis, proximal occlusion, aneurysm, or vascular malformation. Posterior circulation: No significant stenosis, proximal occlusion, aneurysm, or vascular malformation. Venous sinuses: As permitted by contrast timing, patent. Anatomic variants: None significant. Review of the MIP images confirms the above findings IMPRESSION: 1. No acute intracranial abnormality. 2. No intracranial large vessel occlusion or hemodynamically significant stenosis. 3. Mild atherosclerotic changes of the bilateral carotid bifurcations without hemodynamically significant stenosis. 4. No evidence of traumatic vascular injury to the major neck arteries. Electronically Signed   By: KPedro EarlsM.D.   On: 07/27/2021 15:45    Assessment & Plan:   Problem List Items Addressed This Visit     Edema    Not better. Reduce Amlodipine dose Add HCTZ low dose      Erectile dysfunction    Chronic Cont on Viagra prn      Essential hypertension    Stop Amlodipine 10 mg due to edema and Losartan. Take Tribenzor instead      Relevant Medications   metoprolol succinate (TOPROL-XL) 100 MG 24 hr tablet   Olmesartan-amLODIPine-HCTZ (TRIBENZOR) 40-5-12.5 MG TABS   sildenafil (VIAGRA) 100 MG tablet   GERD (gastroesophageal reflux disease)   Relevant Medications   omeprazole (PRILOSEC) 20 MG capsule      Meds ordered this encounter  Medications   metoprolol succinate (TOPROL-XL) 100 MG 24 hr tablet    Sig: Take 1 tablet (100 mg total) by mouth daily. Take with or immediately following a meal.    Dispense:  90 tablet    Refill:  3   Olmesartan-amLODIPine-HCTZ  (TRIBENZOR) 40-5-12.5 MG TABS    Sig: Take 1 tablet by mouth 1 day or 1 dose.    Dispense:  90 tablet    Refill:  3   omeprazole (PRILOSEC) 20 MG capsule  Sig: Take 1 capsule (20 mg total) by mouth 2 (two) times daily before a meal. Annual appt due in Oct must see provider for future refills    Dispense:  180 capsule    Refill:  3   sildenafil (VIAGRA) 100 MG tablet    Sig: Take 0.5-1 tablets (50-100 mg total) by mouth daily as needed for erectile dysfunction.    Dispense:  20 tablet    Refill:  5      Follow-up: Return in about 3 months (around 11/21/2021) for a follow-up visit.  Walker Kehr, MD

## 2021-08-21 NOTE — Assessment & Plan Note (Signed)
Not better. Reduce Amlodipine dose Add HCTZ low dose

## 2021-08-21 NOTE — Assessment & Plan Note (Signed)
Chronic Cont on Viagra prn

## 2021-08-21 NOTE — Patient Instructions (Signed)
Stop Amlodipine and Losartan. Take Tribenzor instead

## 2021-08-21 NOTE — Assessment & Plan Note (Signed)
Stop Amlodipine 10 mg due to edema and Losartan. Take Tribenzor instead

## 2021-10-01 ENCOUNTER — Other Ambulatory Visit: Payer: Self-pay | Admitting: Internal Medicine

## 2021-11-07 ENCOUNTER — Ambulatory Visit (INDEPENDENT_AMBULATORY_CARE_PROVIDER_SITE_OTHER): Payer: Medicare PPO

## 2021-11-07 ENCOUNTER — Encounter: Payer: Self-pay | Admitting: Internal Medicine

## 2021-11-07 ENCOUNTER — Ambulatory Visit: Payer: Medicare PPO | Admitting: Internal Medicine

## 2021-11-07 VITALS — BP 152/86 | HR 64 | Temp 98.5°F | Ht 71.0 in | Wt 230.0 lb

## 2021-11-07 DIAGNOSIS — I10 Essential (primary) hypertension: Secondary | ICD-10-CM | POA: Diagnosis not present

## 2021-11-07 DIAGNOSIS — F17200 Nicotine dependence, unspecified, uncomplicated: Secondary | ICD-10-CM | POA: Diagnosis not present

## 2021-11-07 DIAGNOSIS — R051 Acute cough: Secondary | ICD-10-CM

## 2021-11-07 DIAGNOSIS — R739 Hyperglycemia, unspecified: Secondary | ICD-10-CM | POA: Diagnosis not present

## 2021-11-07 DIAGNOSIS — R0989 Other specified symptoms and signs involving the circulatory and respiratory systems: Secondary | ICD-10-CM | POA: Diagnosis not present

## 2021-11-07 DIAGNOSIS — R059 Cough, unspecified: Secondary | ICD-10-CM | POA: Diagnosis not present

## 2021-11-07 LAB — POC INFLUENZA A&B (BINAX/QUICKVUE)
Influenza A, POC: NEGATIVE
Influenza B, POC: NEGATIVE

## 2021-11-07 LAB — POC SOFIA SARS ANTIGEN FIA: SARS Coronavirus 2 Ag: NEGATIVE

## 2021-11-07 MED ORDER — HYDROCODONE BIT-HOMATROP MBR 5-1.5 MG/5ML PO SOLN: 5.0000 mL | Freq: Four times a day (QID) | ORAL | 0 refills | Status: AC | PRN

## 2021-11-07 MED ORDER — AZITHROMYCIN 250 MG PO TABS: ORAL_TABLET | ORAL | 1 refills | Status: AC

## 2021-11-07 NOTE — Assessment & Plan Note (Signed)
BP Readings from Last 3 Encounters:  11/07/21 (!) 152/86  08/21/21 138/82  07/27/21 (!) 190/83   Uncontrolled today, declines change in tx as wary of side effect, possibly reactive today, pt to continue medical treatment toprol xl 100 qd, and tribenzor 40-5-12.5 qd

## 2021-11-07 NOTE — Assessment & Plan Note (Signed)
Pt counsled to quit, pt not ready °

## 2021-11-07 NOTE — Assessment & Plan Note (Signed)
Lab Results  Component Value Date   HGBA1C 6.3 04/10/2021   Stable, pt to continue current medical treatment - diet, wt control, excercise

## 2021-11-07 NOTE — Patient Instructions (Addendum)
Please take all new medication as prescribed - the antibiotic, and cough medicine  Please continue to monitor your BP at  home and next visit  Please continue all other medications as before, and refills have been done if requested.  Please have the pharmacy call with any other refills you may need.  Please keep your appointments with your specialists as you may have planned  Please go to the XRAY Department in the first floor for the x-ray testing  /You will be contacted by phone if any changes need to be made immediately.  Otherwise, you will receive a letter about your results with an explanation, but please check with MyChart first.  Please remember to sign up for MyChart if you have not done so, as this will be important to you in the future with finding out test results, communicating by private email, and scheduling acute appointments online when needed.

## 2021-11-07 NOTE — Progress Notes (Signed)
Patient ID: Robert [name], male   DOB: 12/22/1950, 71 y.o.   MRN: [id number]        Chief Complaint: follow up HTN, cough with congestion, and hypergclyemia       HPI:  Robert [name] is a 71 y.o. male  Here with acute onset mild to mod 2-3 days ST, HA, general weakness and malaise, with prod cough greenish sputum, but Pt denies chest pain, increased sob or doe, wheezing, orthopnea, PND, increased LE swelling, palpitations, dizziness or syncope. Has been exposed to wife and grandhchild with similar illness, but his is in the chest , and has hx of pna,   Pt denies polydipsia, polyuria, or new focal neuro s/s.   BP at home has been borderline and occasionally high, does not want further med tx as he is wary of loss of sexual performance. Smoking cigars occasoinaly  Wt Readings from Last 3 Encounters:  11/07/21 230 lb (104.3 kg)  08/21/21 233 lb 12.8 oz (106.1 kg)  07/27/21 219 lb (99.3 kg)   BP Readings from Last 3 Encounters:  11/07/21 (!) 152/86  08/21/21 138/82  07/27/21 (!) 190/83         Past Medical History:  Diagnosis Date   Allergy    Anxiety    ED (erectile dysfunction)    GERD (gastroesophageal reflux disease)    History of gastritis    History of UTI    Hx of colonic polyps    Hyperlipidemia    Hypertension    IBS (irritable bowel syndrome)    Myalgia    Venous insufficiency    Past Surgical History:  Procedure Laterality Date   APPENDECTOMY     COLON SURGERY  07/2009   Dr. Osborne Casco tubulovillous adenoma   COLONOSCOPY     VASECTOMY      reports that he has been smoking cigars. He has never used smokeless tobacco. He reports current alcohol use. He reports that he does not use drugs. family history includes Colon cancer in his cousin. Allergies  Allergen Reactions   Atorvastatin     REACTION: INTOL to Stains w/ myalgias   Current Outpatient Medications on File Prior to Visit  Medication Sig Dispense Refill   ALPRAZolam (XANAX) 0.5 MG tablet Take 1 tablet (0.5 mg  total) by mouth 2 (two) times daily as needed for anxiety. 180 tablet 1   Ascorbic Acid (VITAMIN C) 100 MG tablet Take 100 mg by mouth daily.     aspirin 81 MG tablet Take 81 mg by mouth daily.     Cholecalciferol (VITAMIN D3) 2000 units capsule Take 1 capsule (2,000 Units total) by mouth daily. 100 capsule 3   gemfibrozil (LOPID) 600 MG tablet TAKE 1 TABLET BY MOUTH TWICE DAILY 180 tablet 3   KRILL OIL PO Take by mouth.     levocetirizine (XYZAL) 5 MG tablet TAKE 1 TABLET(5 MG) BY MOUTH EVERY EVENING 90 tablet 3   methocarbamol (ROBAXIN) 500 MG tablet Take 1 tablet (500 mg total) by mouth every 8 (eight) hours as needed for muscle spasms. 20 tablet 0   metoprolol succinate (TOPROL-XL) 100 MG 24 hr tablet Take 1 tablet (100 mg total) by mouth daily. Take with or immediately following a meal. 90 tablet 3   Olmesartan-amLODIPine-HCTZ (TRIBENZOR) 40-5-12.5 MG TABS Take 1 tablet by mouth 1 day or 1 dose. 90 tablet 3   omeprazole (PRILOSEC) 20 MG capsule Take 1 capsule (20 mg total) by mouth 2 (two) times daily before a meal. Annual  appt due in Oct must see provider for future refills 180 capsule 3   pravastatin (PRAVACHOL) 20 MG tablet Take 1 tablet (20 mg total) by mouth daily. 90 tablet 3   sildenafil (VIAGRA) 100 MG tablet Take 0.5-1 tablets (50-100 mg total) by mouth daily as needed for erectile dysfunction. 20 tablet 5   tamsulosin (FLOMAX) 0.4 MG CAPS capsule TAKE 1 CAPSULE BY MOUTH EVERY DAY AFTER SUPPER 90 capsule 1   No current facility-administered medications on file prior to visit.        ROS:  All others reviewed and negative.  Objective        PE:  BP (!) 152/86 (BP Location: Right Arm, Patient Position: Sitting, Cuff Size: Large)   Pulse 64   Temp 98.5 F (36.9 C) (Oral)   Ht '5\' 11"'$  (1.803 m)   Wt 230 lb (104.3 kg)   SpO2 95%   BMI 32.08 kg/m                 Constitutional: Pt appears in NAD               HENT: Head: NCAT.                Right Ear: External ear normal.                  Left Ear: External ear normal. Bilat tm's with mild erythema.  Max sinus areas mild tender.  Pharynx with mild erythema, no exudate               Eyes: . Pupils are equal, round, and reactive to light. Conjunctivae and EOM are normal               Nose: without d/c or deformity               Neck: Neck supple. Gross normal ROM               Cardiovascular: Normal rate and regular rhythm.                 Pulmonary/Chest: Effort normal and breath sounds decreased without rales or wheezing.                Abd:  Soft, NT, ND, + BS, no organomegaly               Neurological: Pt is alert. At baseline orientation, motor grossly intact               Skin: Skin is warm. No rashes, no other new lesions, LE edema - trace pedal               Psychiatric: Pt behavior is normal without agitation   Micro: none  Cardiac tracings I have personally interpreted today:  none  Pertinent Radiological findings (summarize): none   Lab Results  Component Value Date   WBC 3.7 (L) 07/27/2021   HGB 14.0 07/27/2021   HCT 41.3 07/27/2021   PLT 213 07/27/2021   GLUCOSE 98 07/27/2021   CHOL 209 (H) 10/12/2020   TRIG 237.0 (H) 10/12/2020   HDL 44.00 10/12/2020   LDLDIRECT 47.0 10/12/2020   LDLCALC 86 05/09/2020   ALT 44 04/10/2021   AST 48 (H) 04/10/2021   NA 139 07/27/2021   K 3.9 07/27/2021   CL 103 07/27/2021   CREATININE 1.02 07/27/2021   BUN 11 07/27/2021   CO2 25 07/27/2021   TSH 1.82 05/09/2020  PSA 1.72 05/09/2020   HGBA1C 6.3 04/10/2021   POCT - Covid and flu negative  Assessment/Plan:  Burnham Schweickert is a 71 y.o. Black or African American [2] male with  has a past medical history of Allergy, Anxiety, ED (erectile dysfunction), GERD (gastroesophageal reflux disease), History of gastritis, History of UTI, colonic polyps, Hyperlipidemia, Hypertension, IBS (irritable bowel syndrome), Myalgia, and Venous insufficiency.  Acute cough Mild to mod, c/w bronchitis vs pna, for antibx  course, cough med prn, and cxr,   to f/u any worsening symptoms or concerns   Hyperglycemia Lab Results  Component Value Date   HGBA1C 6.3 04/10/2021   Stable, pt to continue current medical treatment - diet, wt control, excercise   Essential hypertension BP Readings from Last 3 Encounters:  11/07/21 (!) 152/86  08/21/21 138/82  07/27/21 (!) 190/83   Uncontrolled today, declines change in tx as wary of side effect, possibly reactive today, pt to continue medical treatment toprol xl 100 qd, and tribenzor 40-5-12.5 qd   Smoking Pt counsled to quit, pt not ready  Followup: Return if symptoms worsen or fail to improve.  Cathlean Cower, MD 11/07/2021 9:48 AM [facility]

## 2021-11-07 NOTE — Assessment & Plan Note (Signed)
Mild to mod, c/w bronchitis vs pna, for antibx course, cough med prn, and cxr,   to f/u any worsening symptoms or concerns

## 2021-11-12 ENCOUNTER — Other Ambulatory Visit: Payer: Self-pay | Admitting: Internal Medicine

## 2021-12-24 ENCOUNTER — Other Ambulatory Visit: Payer: Self-pay | Admitting: Internal Medicine

## 2022-02-13 DIAGNOSIS — G4733 Obstructive sleep apnea (adult) (pediatric): Secondary | ICD-10-CM | POA: Diagnosis not present

## 2022-03-27 ENCOUNTER — Telehealth: Payer: Self-pay

## 2022-03-27 NOTE — Telephone Encounter (Signed)
Contacted Robert [name] to schedule their annual wellness visit. Appointment made for 03/27/22.  Norton Blizzard, [place] ([facility])  [facility] [phone]

## 2022-04-02 ENCOUNTER — Ambulatory Visit (INDEPENDENT_AMBULATORY_CARE_PROVIDER_SITE_OTHER): Payer: Medicare PPO

## 2022-04-02 VITALS — Ht 71.0 in | Wt 220.0 lb

## 2022-04-02 DIAGNOSIS — Z1211 Encounter for screening for malignant neoplasm of colon: Secondary | ICD-10-CM | POA: Diagnosis not present

## 2022-04-02 DIAGNOSIS — Z0000 Encounter for general adult medical examination without abnormal findings: Secondary | ICD-10-CM

## 2022-04-02 NOTE — Patient Instructions (Addendum)
Robert [name] , Thank you for taking time to come for your Medicare Wellness Visit. I appreciate your ongoing commitment to your health goals. Please review the following plan we discussed and let me know if I can assist you in the future.   These are the goals we discussed:  Goals      My goal for 2024 is to get down to 215 pounds. Also get my cholesterol numbers down.        This is a list of the screening recommended for you and due dates:  Health Maintenance  Topic Date Due   Zoster (Shingles) Vaccine (2 of 2) 07/21/2019   Colon Cancer Screening  07/18/2022   Flu Shot  08/02/2022   Medicare Annual Wellness Visit  04/02/2023   DTaP/Tdap/Td vaccine (2 - Td or Tdap) 05/18/2024   Pneumonia Vaccine  Completed   COVID-19 Vaccine  Completed   Hepatitis C Screening: USPSTF Recommendation to screen - Ages 55-79 yo.  Completed   HPV Vaccine  Aged Out    Advanced directives: No  Conditions/risks identified: Yes  Next appointment: Follow up in one year for your annual wellness visit.   Preventive Care 67 Years and Older, Male  Preventive care refers to lifestyle choices and visits with your health care provider that can promote health and wellness. What does preventive care include? A yearly physical exam. This is also called an annual well check. Dental exams once or twice a year. Routine eye exams. Ask your health care provider how often you should have your eyes checked. Personal lifestyle choices, including: Daily care of your teeth and gums. Regular physical activity. Eating a healthy diet. Avoiding tobacco and drug use. Limiting alcohol use. Practicing safe sex. Taking low doses of aspirin every day. Taking vitamin and mineral supplements as recommended by your health care provider. What happens during an annual well check? The services and screenings done by your health care provider during your annual well check will depend on your age, overall health, lifestyle risk  factors, and family history of disease. Counseling  Your health care provider may ask you questions about your: Alcohol use. Tobacco use. Drug use. Emotional well-being. Home and relationship well-being. Sexual activity. Eating habits. History of falls. Memory and ability to understand (cognition). Work and work Statistician. Screening  You may have the following tests or measurements: Height, weight, and BMI. Blood pressure. Lipid and cholesterol levels. These may be checked every 5 years, or more frequently if you are over 72 years old. Skin check. Lung cancer screening. You may have this screening every year starting at age 72 if you have a 30-pack-year history of smoking and currently smoke or have quit within the past 15 years. Fecal occult blood test (FOBT) of the stool. You may have this test every year starting at age 72. Flexible sigmoidoscopy or colonoscopy. You may have a sigmoidoscopy every 5 years or a colonoscopy every 10 years starting at age 72. Prostate cancer screening. Recommendations will vary depending on your family history and other risks. Hepatitis C blood test. Hepatitis B blood test. Sexually transmitted disease (STD) testing. Diabetes screening. This is done by checking your blood sugar (glucose) after you have not eaten for a while (fasting). You may have this done every 1-3 years. Abdominal aortic aneurysm (AAA) screening. You may need this if you are a current or former smoker. Osteoporosis. You may be screened starting at age 72 if you are at high risk. Talk with your health care  provider about your test results, treatment options, and if necessary, the need for more tests. Vaccines  Your health care provider may recommend certain vaccines, such as: Influenza vaccine. This is recommended every year. Tetanus, diphtheria, and acellular pertussis (Tdap, Td) vaccine. You may need a Td booster every 10 years. Zoster vaccine. You may need this after age  72. Pneumococcal 13-valent conjugate (PCV13) vaccine. One dose is recommended after age 72. Pneumococcal polysaccharide (PPSV23) vaccine. One dose is recommended after age 72. Talk to your health care provider about which screenings and vaccines you need and how often you need them. This information is not intended to replace advice given to you by your health care provider. Make sure you discuss any questions you have with your health care provider. Document Released: 01/14/2015 Document Revised: 09/07/2015 Document Reviewed: 10/19/2014 Elsevier Interactive Patient Education  2017 [place] Prevention in the Home Falls can cause injuries. They can happen to people of all ages. There are many things you can do to make your home safe and to help prevent falls. What can I do on the outside of my home? Regularly fix the edges of walkways and driveways and fix any cracks. Remove anything that might make you trip as you walk through a door, such as a raised step or threshold. Trim any bushes or trees on the path to your home. Use bright outdoor lighting. Clear any walking paths of anything that might make someone trip, such as rocks or tools. Regularly check to see if handrails are loose or broken. Make sure that both sides of any steps have handrails. Any raised decks and porches should have guardrails on the edges. Have any leaves, snow, or ice cleared regularly. Use sand or salt on walking paths during winter. Clean up any spills in your garage right away. This includes oil or grease spills. What can I do in the bathroom? Use night lights. Install grab bars by the toilet and in the tub and shower. Do not use towel bars as grab bars. Use non-skid mats or decals in the tub or shower. If you need to sit down in the shower, use a plastic, non-slip stool. Keep the floor dry. Clean up any water that spills on the floor as soon as it happens. Remove soap buildup in the tub or shower  regularly. Attach bath mats securely with double-sided non-slip rug tape. Do not have throw rugs and other things on the floor that can make you trip. What can I do in the bedroom? Use night lights. Make sure that you have a light by your bed that is easy to reach. Do not use any sheets or blankets that are too big for your bed. They should not hang down onto the floor. Have a firm chair that has side arms. You can use this for support while you get dressed. Do not have throw rugs and other things on the floor that can make you trip. What can I do in the kitchen? Clean up any spills right away. Avoid walking on wet floors. Keep items that you use a lot in easy-to-reach places. If you need to reach something above you, use a strong step stool that has a grab bar. Keep electrical cords out of the way. Do not use floor polish or wax that makes floors slippery. If you must use wax, use non-skid floor wax. Do not have throw rugs and other things on the floor that can make you trip. What can I  do with my stairs? Do not leave any items on the stairs. Make sure that there are handrails on both sides of the stairs and use them. Fix handrails that are broken or loose. Make sure that handrails are as long as the stairways. Check any carpeting to make sure that it is firmly attached to the stairs. Fix any carpet that is loose or worn. Avoid having throw rugs at the top or bottom of the stairs. If you do have throw rugs, attach them to the floor with carpet tape. Make sure that you have a light switch at the top of the stairs and the bottom of the stairs. If you do not have them, ask someone to add them for you. What else can I do to help prevent falls? Wear shoes that: Do not have high heels. Have rubber bottoms. Are comfortable and fit you well. Are closed at the toe. Do not wear sandals. If you use a stepladder: Make sure that it is fully opened. Do not climb a closed stepladder. Make sure that  both sides of the stepladder are locked into place. Ask someone to hold it for you, if possible. Clearly mark and make sure that you can see: Any grab bars or handrails. First and last steps. Where the edge of each step is. Use tools that help you move around (mobility aids) if they are needed. These include: Canes. Walkers. Scooters. Crutches. Turn on the lights when you go into a dark area. Replace any light bulbs as soon as they burn out. Set up your furniture so you have a clear path. Avoid moving your furniture around. If any of your floors are uneven, fix them. If there are any pets around you, be aware of where they are. Review your medicines with your doctor. Some medicines can make you feel dizzy. This can increase your chance of falling. Ask your doctor what other things that you can do to help prevent falls. This information is not intended to replace advice given to you by your health care provider. Make sure you discuss any questions you have with your health care provider. Document Released: 10/14/2008 Document Revised: 05/26/2015 Document Reviewed: 01/22/2014 Elsevier Interactive Patient Education  2017 [employer].

## 2022-04-02 NOTE — Progress Notes (Addendum)
I connected with  Robert [name] on 04/02/22 by a audio enabled telemedicine application and verified that I am speaking with the correct person using two identifiers.  Patient Location: Home  Provider Location: Home Office  I discussed the limitations of evaluation and management by telemedicine. The patient expressed understanding and agreed to proceed.  Subjective:   Robert [name] is a 71 y.o. male who presents for Medicare Annual/Subsequent preventive examination.  Review of Systems     Cardiac Risk Factors include: advanced age (>82men, >42 women);dyslipidemia;hypertension;male gender;obesity (BMI >30kg/m2)     Objective:    Today's Vitals   04/02/22 1605  Weight: 220 lb (99.8 kg)  Height: 5\' 11"  (1.803 m)  PainSc: 0-No pain   Body mass index is 30.68 kg/m.     04/02/2022    4:08 PM 03/31/2021    2:08 PM 03/22/2016    1:37 PM 03/12/2016    8:27 AM  Advanced Directives  Does Patient Have a Medical Advance Directive? No No No No  Would patient like information on creating a medical advance directive? No - Patient declined No - Patient declined No - Patient declined No - Patient declined    Current Medications (verified) Outpatient Encounter Medications as of 04/02/2022  Medication Sig   ALPRAZolam (XANAX) 0.5 MG tablet Take 1 tablet (0.5 mg total) by mouth 2 (two) times daily as needed for anxiety.   Ascorbic Acid (VITAMIN C) 100 MG tablet Take 100 mg by mouth daily.   aspirin 81 MG tablet Take 81 mg by mouth daily.   Cholecalciferol (VITAMIN D3) 2000 units capsule Take 1 capsule (2,000 Units total) by mouth daily.   gemfibrozil (LOPID) 600 MG tablet TAKE 1 TABLET BY MOUTH TWICE DAILY   KRILL OIL PO Take by mouth.   levocetirizine (XYZAL) 5 MG tablet TAKE 1 TABLET(5 MG) BY MOUTH EVERY EVENING   methocarbamol (ROBAXIN) 500 MG tablet Take 1 tablet (500 mg total) by mouth every 8 (eight) hours as needed for muscle spasms.   metoprolol succinate (TOPROL-XL) 100 MG 24 hr tablet  Take 1 tablet (100 mg total) by mouth daily. Take with or immediately following a meal.   Olmesartan-amLODIPine-HCTZ (TRIBENZOR) 40-5-12.5 MG TABS Take 1 tablet by mouth 1 day or 1 dose.   omeprazole (PRILOSEC) 20 MG capsule Take 1 capsule (20 mg total) by mouth daily. Annual appt due w/labs must see provider for future refills   pravastatin (PRAVACHOL) 20 MG tablet Take 1 tablet (20 mg total) by mouth daily.   sildenafil (VIAGRA) 100 MG tablet Take 0.5-1 tablets (50-100 mg total) by mouth daily as needed for erectile dysfunction.   tamsulosin (FLOMAX) 0.4 MG CAPS capsule TAKE 1 CAPSULE BY MOUTH EVERY DAY AFTER SUPPER   No facility-administered encounter medications on file as of 04/02/2022.    Allergies (verified) Atorvastatin   History: Past Medical History:  Diagnosis Date   Allergy    Anxiety    ED (erectile dysfunction)    GERD (gastroesophageal reflux disease)    History of gastritis    History of UTI    Hx of colonic polyps    Hyperlipidemia    Hypertension    IBS (irritable bowel syndrome)    Myalgia    Venous insufficiency    Past Surgical History:  Procedure Laterality Date   APPENDECTOMY     COLON SURGERY  07/2009   Dr. 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    Employer: [employer]  Tobacco Use   Smoking status: Unknown   Smokeless tobacco: Never   Tobacco comments:    smokes one cigar once a  month-01/27/20    04/02/2022 patient stated he quit smoking cigars-S.Ajahni Nay, LPN  Vaping Use   Vaping Use: Never used  Substance and Sexual Activity   Alcohol use: Yes    Comment: social use, very occasional   Drug use: No   Sexual activity: Yes  Other Topics Concern   Not on file  Social History Narrative   Not on file   Social Determinants of Health   Financial Resource Strain: Low Risk  (04/02/2022)   Overall Financial Resource Strain (CARDIA)    Difficulty of Paying Living Expenses: Not hard at all  Food Insecurity: No Food Insecurity (04/02/2022)   Hunger Vital Sign    Worried About Running Out of Food in the Last Year: Never true    Ran Out of Food in the Last Year: Never true  Transportation Needs: No Transportation Needs (04/02/2022)   PRAPARE - Hydrologist (Medical): No    Lack of Transportation (Non-Medical): No  Physical Activity: Sufficiently Active (04/02/2022)   Exercise Vital Sign    Days of Exercise per Week: 3 days    Minutes of Exercise per Session: 120 min  Stress: No Stress Concern Present (04/02/2022)   [place]    Feeling of Stress : Not at all  Social Connections: [place] (04/02/2022)   Social Connection and Isolation Panel [NHANES]    Frequency of Communication with Friends and Family: More than three times a week    Frequency of Social Gatherings with Friends and Family: More than three times a week    Attends Religious Services: More than 4 times per year    Active Member of [employer] or Organizations: Yes    Attends Archivist Meetings: More than 4 times per year    Marital Status: Married    Tobacco Counseling Counseling given: No Tobacco comments: smokes one cigar once a month-01/27/20 04/02/2022 patient stated he quit smoking cigars-S.Laporscha Linehan, LPN   Clinical Intake:  Pre-visit preparation completed: Yes  Pain : No/denies pain Pain  Score: 0-No pain     BMI - recorded: 30.68 Nutritional Status: BMI > 30  Obese Nutritional Risks: None Diabetes: No  How often do you need to have someone help you when you read instructions, pamphlets, or other written materials from your doctor or pharmacy?: 1 - Never What is the last grade level you completed in school?: Bachelor's Degree  Diabetic? No  Interpreter Needed?: No  Information entered by :: Lisette Abu, LPN.   Activities of Daily Living    04/02/2022    4:13 PM  In your present  state of health, do you have any difficulty performing the following activities:  Hearing? 0  Vision? 0  Difficulty concentrating or making decisions? 0  Walking or climbing stairs? 0  Dressing or bathing? 0  Doing errands, shopping? 0  Preparing Food and eating ? N  Using the Toilet? N  In the past six months, have you accidently leaked urine? N  Do you have problems with loss of bowel control? N  Managing your Medications? N  Managing your Finances? N  Housekeeping or managing your Housekeeping? N    Patient Care Team: Plotnikov, Evie Lacks, MD as PCP - General (Internal Medicine) Laurence Aly, [place] as Consulting Physician (Optometry)  Indicate any recent Medical Services you may have received from other than [facility] providers in the past year (date may be approximate).     Assessment:   This is a routine wellness examination for Robert [name].  Hearing/Vision screen Hearing Screening - Comments:: Denies hearing difficulties.   Vision Screening - Comments:: Wears rx glasses - up to date with routine eye exams with [facility]   Dietary issues and exercise activities discussed: Current Exercise Habits: Structured exercise class (GYM 3 TIMES PER WEEK FOR 2 HOURS), Type of exercise: walking;treadmill;stretching;strength training/weights, Time (Minutes): > 60 (2 HOURS), Frequency (Times/Week): 3, Weekly Exercise (Minutes/Week): 0, Intensity: Moderate, Exercise limited by:  None identified   Goals Addressed             This Visit's Progress    My goal for 2024 is to get down to 215 pounds. Also get my cholesterol numbers down.        Depression Screen    04/02/2022    4:13 PM 03/31/2021    2:10 PM 05/09/2020   11:16 AM 05/06/2019   11:12 AM 03/20/2017    9:20 AM 03/12/2016    8:27 AM 03/18/2012   10:00 AM  PHQ 2/9 Scores  PHQ - 2 Score 0 0 0 0 0 0 0  PHQ- 9 Score 0  2      Exception Documentation      Patient refusal     Fall Risk    04/02/2022    4:10 PM 03/31/2021    2:09 PM 05/09/2020   11:17 AM 05/06/2019   11:12 AM 03/20/2017    9:20 AM  Fall Risk   Falls in the past year? 0 0 0 0 No  Number falls in past yr: 0 0 0    Injury with Fall? 0 0 0    Risk for fall due to : No Fall Risks No Fall Risks No Fall Risks    Follow up Falls prevention discussed Falls evaluation completed  Falls evaluation completed     FALL RISK PREVENTION PERTAINING TO THE HOME:  Any stairs in or around the home? Yes  If so, are there any without handrails? No  Home free of loose throw rugs in walkways, pet beds, electrical cords, etc? Yes  Adequate lighting in your home to reduce risk of falls? Yes   ASSISTIVE DEVICES UTILIZED TO PREVENT FALLS:  Life alert? No  Use of a cane, walker or w/c? No  Grab bars in the bathroom? Yes  Shower chair or bench in shower? Yes  Elevated toilet seat or a handicapped toilet? Yes   TIMED UP AND GO:  Was the test performed? No . Telephonic Visit   Cognitive Function:        04/02/2022    4:13 PM 03/31/2021  3:21 PM  6CIT Screen  What Year? 0 points 0 points  What month? 0 points 0 points  What time? 0 points 0 points  Count back from 20 0 points 0 points  Months in reverse 0 points 0 points  Repeat phrase 0 points 0 points  Total Score 0 points 0 points    Immunizations Immunization History  Administered Date(s) Administered   COVID-19, mRNA, vaccine(Comirnaty)12 years and older 10/04/2021   Fluad Quad(high Dose  65+) 10/04/2021   Influenza Split 11/02/2010, 10/18/2011, 10/01/2012   Influenza Whole 12/05/2009   Influenza, High Dose Seasonal PF 09/28/2020   Influenza,inj,Quad PF,6+ Mos 10/15/2016   Influenza-Unspecified 10/01/2013, 10/18/2014, 10/08/2017   PFIZER(Purple Top)SARS-COV-2 Vaccination 01/23/2019, 02/13/2019, 09/27/2019, 04/25/2020   Pfizer Covid-19 Vaccine Bivalent Booster 11yrs & up 09/29/2020   Pneumococcal Conjugate-13 12/14/2003, 05/09/2020   Pneumococcal Polysaccharide-23 05/31/2015, 03/20/2017   Respiratory Syncytial Virus Vaccine,Recomb Aduvanted(Arexvy) 01/13/2022   Tdap 05/19/2014   Zoster Recombinat (Shingrix) 05/26/2019    TDAP status: Up to date  Flu Vaccine status: Up to date  Pneumococcal vaccine status: Up to date  Covid-19 vaccine status: Completed vaccines  Qualifies for Shingles Vaccine? Yes   Zostavax completed No   Shingrix Completed?: Yes  Screening Tests Health Maintenance  Topic Date Due   Zoster Vaccines- Shingrix (2 of 2) 07/21/2019   COLONOSCOPY (Pts 45-56yrs Insurance coverage will need to be confirmed)  07/18/2022   INFLUENZA VACCINE  08/02/2022   Medicare Annual Wellness (AWV)  04/02/2023   DTaP/Tdap/Td (2 - Td or Tdap) 05/18/2024   Pneumonia Vaccine 22+ Years old  Completed   COVID-19 Vaccine  Completed   Hepatitis C Screening  Completed   HPV VACCINES  Aged Out    Health Maintenance  Health Maintenance Due  Topic Date Due   Zoster Vaccines- Shingrix (2 of 2) 07/21/2019    Colorectal cancer screening: Referral to GI placed 04/02/2022. Pt aware the office will call re: appt.  Lung Cancer Screening: (Low Dose CT Chest recommended if Age 53-80 years, 30 pack-year currently smoking OR have quit w/in 15years.) does not qualify.   Lung Cancer Screening Referral: no  Additional Screening:  Hepatitis C Screening: does qualify; Completed 05/31/2015  Vision Screening: Recommended annual ophthalmology exams for early detection of glaucoma  and other disorders of the eye. Is the patient up to date with their annual eye exam?  Yes  Who is the provider or what is the name of the office in which the patient attends annual eye exams? [facility] If pt is not established with a provider, would they like to be referred to a provider to establish care? No .   Dental Screening: Recommended annual dental exams for proper oral hygiene  Community Resource Referral / Chronic Care Management: CRR required this visit?  No   CCM required this visit?  No      Plan:     I have personally reviewed and noted the following in the patient's chart:   Medical and social history Use of alcohol, tobacco or illicit drugs  Current medications and supplements including opioid prescriptions. Patient is not currently taking opioid prescriptions. Functional ability and status Nutritional status Physical activity Advanced directives List of other physicians Hospitalizations, surgeries, and ER visits in previous 12 months Vitals Screenings to include cognitive, depression, and falls Referrals and appointments  In addition, I have reviewed and discussed with patient certain preventive protocols, quality metrics, and best practice recommendations. A written personalized care plan for preventive  services as well as general preventive health recommendations were provided to patient.     Sheral Flow, LPN   [id number]   Nurse Notes: Normal cognitive status assessed by direct observation by this Nurse Health Advisor. No abnormalities found.    Medical screening examination/treatment/procedure(s) were performed by non-physician practitioner and as supervising physician I was immediately available for consultation/collaboration.  I agree with above. Lew Dawes, MD

## 2022-04-19 ENCOUNTER — Other Ambulatory Visit: Payer: Self-pay | Admitting: Internal Medicine

## 2022-05-07 ENCOUNTER — Encounter: Payer: Self-pay | Admitting: Internal Medicine

## 2022-05-09 DIAGNOSIS — G4733 Obstructive sleep apnea (adult) (pediatric): Secondary | ICD-10-CM | POA: Diagnosis not present

## 2022-06-12 ENCOUNTER — Other Ambulatory Visit: Payer: Self-pay | Admitting: Internal Medicine

## 2022-06-25 ENCOUNTER — Ambulatory Visit (INDEPENDENT_AMBULATORY_CARE_PROVIDER_SITE_OTHER): Payer: Medicare PPO | Admitting: Internal Medicine

## 2022-06-25 ENCOUNTER — Encounter: Payer: Self-pay | Admitting: Internal Medicine

## 2022-06-25 VITALS — BP 130/80 | HR 55 | Temp 97.9°F | Ht 71.0 in | Wt 227.0 lb

## 2022-06-25 DIAGNOSIS — N1831 Chronic kidney disease, stage 3a: Secondary | ICD-10-CM | POA: Diagnosis not present

## 2022-06-25 DIAGNOSIS — D649 Anemia, unspecified: Secondary | ICD-10-CM

## 2022-06-25 DIAGNOSIS — N2889 Other specified disorders of kidney and ureter: Secondary | ICD-10-CM

## 2022-06-25 DIAGNOSIS — N320 Bladder-neck obstruction: Secondary | ICD-10-CM | POA: Diagnosis not present

## 2022-06-25 DIAGNOSIS — R7989 Other specified abnormal findings of blood chemistry: Secondary | ICD-10-CM

## 2022-06-25 DIAGNOSIS — E785 Hyperlipidemia, unspecified: Secondary | ICD-10-CM | POA: Diagnosis not present

## 2022-06-25 DIAGNOSIS — D126 Benign neoplasm of colon, unspecified: Secondary | ICD-10-CM

## 2022-06-25 DIAGNOSIS — Z0000 Encounter for general adult medical examination without abnormal findings: Secondary | ICD-10-CM

## 2022-06-25 DIAGNOSIS — I10 Essential (primary) hypertension: Secondary | ICD-10-CM | POA: Diagnosis not present

## 2022-06-25 DIAGNOSIS — R739 Hyperglycemia, unspecified: Secondary | ICD-10-CM | POA: Diagnosis not present

## 2022-06-25 LAB — CBC WITH DIFFERENTIAL/PLATELET
Basophils Absolute: 0 10*3/uL (ref 0.0–0.1)
Basophils Relative: 1 % (ref 0.0–3.0)
Eosinophils Absolute: 0.2 10*3/uL (ref 0.0–0.7)
Eosinophils Relative: 7.9 % — ABNORMAL HIGH (ref 0.0–5.0)
HCT: 37.9 % — ABNORMAL LOW (ref 39.0–52.0)
Hemoglobin: 12.7 g/dL — ABNORMAL LOW (ref 13.0–17.0)
Lymphocytes Relative: 41.4 % (ref 12.0–46.0)
Lymphs Abs: 1.3 10*3/uL (ref 0.7–4.0)
MCHC: 33.5 g/dL (ref 30.0–36.0)
MCV: 82.5 fl (ref 78.0–100.0)
Monocytes Absolute: 0.3 10*3/uL (ref 0.1–1.0)
Monocytes Relative: 11.2 % (ref 3.0–12.0)
Neutro Abs: 1.2 10*3/uL — ABNORMAL LOW (ref 1.4–7.7)
Neutrophils Relative %: 38.5 % — ABNORMAL LOW (ref 43.0–77.0)
Platelets: 236 10*3/uL (ref 150.0–400.0)
RBC: 4.59 Mil/uL (ref 4.22–5.81)
RDW: 14.4 % (ref 11.5–15.5)
WBC: 3.1 10*3/uL — ABNORMAL LOW (ref 4.0–10.5)

## 2022-06-25 LAB — URINALYSIS
Bilirubin Urine: NEGATIVE
Hgb urine dipstick: NEGATIVE
Ketones, ur: NEGATIVE
Leukocytes,Ua: NEGATIVE
Nitrite: NEGATIVE
Specific Gravity, Urine: 1.02 (ref 1.000–1.030)
Total Protein, Urine: NEGATIVE
Urine Glucose: NEGATIVE
Urobilinogen, UA: 0.2 (ref 0.0–1.0)
pH: 6 (ref 5.0–8.0)

## 2022-06-25 LAB — LIPID PANEL
Cholesterol: 124 mg/dL (ref 0–200)
HDL: 48.1 mg/dL (ref >39.00)
LDL Cholesterol: 54 mg/dL (ref 0–99)
NonHDL: 75.4
Total CHOL/HDL Ratio: 3
Triglycerides: 108 mg/dL (ref 0.0–149.0)
VLDL: 21.6 mg/dL (ref 0.0–40.0)

## 2022-06-25 LAB — COMPREHENSIVE METABOLIC PANEL
ALT: 25 U/L (ref 0–53)
AST: 37 U/L (ref 0–37)
Albumin: 4.4 g/dL (ref 3.5–5.2)
Alkaline Phosphatase: 56 U/L (ref 39–117)
BUN: 15 mg/dL (ref 6–23)
CO2: 26 mEq/L (ref 19–32)
Calcium: 10.4 mg/dL (ref 8.4–10.5)
Chloride: 106 mEq/L (ref 96–112)
Creatinine, Ser: 1.4 mg/dL (ref 0.40–1.50)
GFR: 50.23 mL/min — ABNORMAL LOW (ref >60.00)
Glucose, Bld: 106 mg/dL — ABNORMAL HIGH (ref 70–99)
Potassium: 4.1 mEq/L (ref 3.5–5.1)
Sodium: 138 mEq/L (ref 135–145)
Total Bilirubin: 0.6 mg/dL (ref 0.2–1.2)
Total Protein: 7.4 g/dL (ref 6.0–8.3)

## 2022-06-25 LAB — TSH: TSH: 2.16 u[IU]/mL (ref 0.35–5.50)

## 2022-06-25 LAB — PSA: PSA: 1.82 ng/mL (ref 0.10–4.00)

## 2022-06-25 NOTE — Assessment & Plan Note (Signed)
Check LFT  

## 2022-06-25 NOTE — Assessment & Plan Note (Signed)
Check A1c. 

## 2022-06-25 NOTE — Assessment & Plan Note (Signed)
Continue with good hydration, monitor GFR 

## 2022-06-25 NOTE — Progress Notes (Signed)
Subjective:  Patient ID: Robert [name], male    DOB: [birth year]  Age: 72 y.o. MRN: [id number]  CC: Annual Exam   HPI Robert [name] presents for a well exam  Outpatient Medications Prior to Visit  Medication Sig Dispense Refill   ALPRAZolam (XANAX) 0.5 MG tablet Take 1 tablet (0.5 mg total) by mouth 2 (two) times daily as needed for anxiety. 180 tablet 1   Ascorbic Acid (VITAMIN C) 100 MG tablet Take 100 mg by mouth daily.     aspirin 81 MG tablet Take 81 mg by mouth daily.     Cholecalciferol (VITAMIN D3) 2000 units capsule Take 1 capsule (2,000 Units total) by mouth daily. 100 capsule 3   gemfibrozil (LOPID) 600 MG tablet TAKE 1 TABLET BY MOUTH TWICE DAILY Annual appt is due must see provider for future refills 60 tablet 0   KRILL OIL PO Take by mouth.     levocetirizine (XYZAL) 5 MG tablet TAKE 1 TABLET(5 MG) BY MOUTH EVERY EVENING 90 tablet 3   methocarbamol (ROBAXIN) 500 MG tablet Take 1 tablet (500 mg total) by mouth every 8 (eight) hours as needed for muscle spasms. 20 tablet 0   metoprolol succinate (TOPROL-XL) 100 MG 24 hr tablet Take 1 tablet (100 mg total) by mouth daily. Take with or immediately following a meal. 90 tablet 3   Olmesartan-amLODIPine-HCTZ 40-5-12.5 MG TABS Take 1 tablet by mouth daily. Annual appt is due must see provider for future refills 30 tablet 0   omeprazole (PRILOSEC) 20 MG capsule Take 1 capsule (20 mg total) by mouth daily. Annual appt due w/labs must see provider for future refills 30 capsule 0   pravastatin (PRAVACHOL) 20 MG tablet Take 1 tablet (20 mg total) by mouth daily. 90 tablet 3   sildenafil (VIAGRA) 100 MG tablet Take 0.5-1 tablets (50-100 mg total) by mouth daily as needed for erectile dysfunction. 20 tablet 5   tamsulosin (FLOMAX) 0.4 MG CAPS capsule TAKE 1 CAPSULE BY MOUTH EVERY DAY AFTER SUPPER 90 capsule 3   No facility-administered medications prior to visit.    ROS: Review of Systems  Constitutional:  Negative for appetite change,  fatigue and unexpected weight change.  HENT:  Negative for congestion, nosebleeds, sneezing, sore throat and trouble swallowing.   Eyes:  Negative for itching and visual disturbance.  Respiratory:  Negative for cough.   Cardiovascular:  Negative for chest pain, palpitations and leg swelling.  Gastrointestinal:  Negative for abdominal distention, blood in stool, diarrhea and nausea.  Genitourinary:  Negative for frequency and hematuria.  Musculoskeletal:  Negative for back pain, gait problem, joint swelling and neck pain.  Skin:  Negative for rash.  Neurological:  Negative for dizziness, tremors, speech difficulty and weakness.  Psychiatric/Behavioral:  Negative for agitation, dysphoric mood and sleep disturbance. The patient is not nervous/anxious.     Objective:  BP 130/80 (BP Location: Left Arm, Patient Position: Sitting, Cuff Size: Large)   Pulse (!) 55   Temp 97.9 F (36.6 C) (Oral)   Ht 5\' 11"  (1.803 m)   Wt 227 lb (103 kg)   SpO2 95%   BMI 31.66 kg/m   BP Readings from Last 3 Encounters:  06/25/22 130/80  11/07/21 (!) 152/86  08/21/21 138/82    Wt Readings from Last 3 Encounters:  06/25/22 227 lb (103 kg)  04/02/22 220 lb (99.8 kg)  11/07/21 230 lb (104.3 kg)    Physical Exam Constitutional:      General: He is not  in acute distress.    Appearance: He is well-developed.     Comments: NAD  Eyes:     Conjunctiva/sclera: Conjunctivae normal.     Pupils: Pupils are equal, round, and reactive to light.  Neck:     Thyroid: No thyromegaly.     Vascular: No JVD.  Cardiovascular:     Rate and Rhythm: Normal rate and regular rhythm.     Heart sounds: Normal heart sounds. No murmur heard.    No friction rub. No gallop.  Pulmonary:     Effort: Pulmonary effort is normal. No respiratory distress.     Breath sounds: Normal breath sounds. No wheezing or rales.  Chest:     Chest wall: No tenderness.  Abdominal:     General: Bowel sounds are normal. There is no  distension.     Palpations: Abdomen is soft. There is no mass.     Tenderness: There is no abdominal tenderness. There is no guarding or rebound.  Musculoskeletal:        General: No tenderness. Normal range of motion.     Cervical back: Normal range of motion.  Lymphadenopathy:     Cervical: No cervical adenopathy.  Skin:    General: Skin is warm and dry.     Findings: No rash.  Neurological:     Mental Status: He is alert and oriented to person, place, and time.     Cranial Nerves: No cranial nerve deficit.     Motor: No abnormal muscle tone.     Coordination: Coordination normal.     Gait: Gait normal.     Deep Tendon Reflexes: Reflexes are normal and symmetric.  Psychiatric:        Behavior: Behavior normal.        Thought Content: Thought content normal.        Judgment: Judgment normal.   Rectal per Dr Leone Payor - colon is due  Lab Results  Component Value Date   WBC 3.7 (L) 07/27/2021   HGB 14.0 07/27/2021   HCT 41.3 07/27/2021   PLT 213 07/27/2021   GLUCOSE 98 07/27/2021   CHOL 209 (H) 10/12/2020   TRIG 237.0 (H) 10/12/2020   HDL 44.00 10/12/2020   LDLDIRECT 47.0 10/12/2020   LDLCALC 86 05/09/2020   ALT 44 04/10/2021   AST 48 (H) 04/10/2021   NA 139 07/27/2021   K 3.9 07/27/2021   CL 103 07/27/2021   CREATININE 1.02 07/27/2021   BUN 11 07/27/2021   CO2 25 07/27/2021   TSH 1.82 05/09/2020   PSA 1.72 05/09/2020   HGBA1C 6.3 04/10/2021    CT ANGIO HEAD NECK W WO CM  Result Date: 07/27/2021 CLINICAL DATA:  Neck trauma, impaired range of motion. EXAM: CT ANGIOGRAPHY HEAD AND NECK TECHNIQUE: Multidetector CT imaging of the head and neck was performed using the standard protocol during bolus administration of intravenous contrast. Multiplanar CT image reconstructions and MIPs were obtained to evaluate the vascular anatomy. Carotid stenosis measurements (when applicable) are obtained utilizing NASCET criteria, using the distal internal carotid diameter as the  denominator. RADIATION DOSE REDUCTION: This exam was performed according to the departmental dose-optimization program which includes automated exposure control, adjustment of the mA and/or kV according to patient size and/or use of iterative reconstruction technique. CONTRAST:  75mL OMNIPAQUE IOHEXOL 350 MG/ML SOLN COMPARISON:  Head CT April 26, 2003. FINDINGS: CT HEAD FINDINGS Brain: No evidence of acute infarction, hemorrhage, hydrocephalus, extra-axial collection or mass lesion/mass effect. Vascular: No hyperdense vessel  or unexpected calcification. Skull: Normal. Negative for fracture or focal lesion. Sinuses: Mucosal thickening with bubbly secretion within the bilateral ethmoid cells and frontal sinuses. Orbits: No acute finding. Review of the MIP images confirms the above findings CTA NECK FINDINGS Aortic arch: Standard branching. Imaged portion shows no evidence of aneurysm or dissection. No significant stenosis of the major arch vessel origins. Right carotid system: Mild atherosclerotic changes in the right carotid bifurcation without hemodynamically significant stenosis. Left carotid system: Mild atherosclerotic changes in the left carotid bifurcation without hemodynamically significant stenosis. Vertebral arteries: Evaluation of the V1 segment of the left vertebral artery is precluded by artifact from dense contrast in adjacent vein. The most distal portion of the left A1 segment and the remainder of the left vertebral artery showed no evidence of occlusion or stenosis. The right vertebral artery has normal course and caliber throughout its entire course. The Skeleton: No acute or aggressive process identified. Other neck: Negative. Upper chest: Negative Review of the MIP images confirms the above findings CTA HEAD FINDINGS Anterior circulation: No significant stenosis, proximal occlusion, aneurysm, or vascular malformation. Posterior circulation: No significant stenosis, proximal occlusion, aneurysm, or  vascular malformation. Venous sinuses: As permitted by contrast timing, patent. Anatomic variants: None significant. Review of the MIP images confirms the above findings IMPRESSION: 1. No acute intracranial abnormality. 2. No intracranial large vessel occlusion or hemodynamically significant stenosis. 3. Mild atherosclerotic changes of the bilateral carotid bifurcations without hemodynamically significant stenosis. 4. No evidence of traumatic vascular injury to the major neck arteries. Electronically Signed   By: Baldemar Lenis M.D.   On: 07/27/2021 15:45    Assessment & Plan:   Problem List Items Addressed This Visit     Well adult exam - Primary      We discussed age appropriate health related issues, including available/recomended screening tests and vaccinations. Labs were ordered to be later reviewed . All questions were answered. We discussed one or more of the following - seat belt use, use of sunscreen/sun exposure exercise, safe sex, fall risk reduction, second hand smoke exposure, firearm use and storage, seat belt use, a need for adhering to healthy diet and exercise. Labs were ordered.  All questions were answered.  Rectal per Dr Leone Payor - colon is due      Colon polyps    Dr Leone Payor Colon due in 2024      Elevated liver function tests    Check LFT      Chronic renal impairment, stage 3a (HCC)    Continue with good hydration, monitor GFR      Hyperglycemia    Check A1c         No orders of the defined types were placed in this encounter.     Follow-up: Return in about 3 months (around 09/25/2022) for a follow-up visit.  Sonda Primes, MD

## 2022-06-25 NOTE — Assessment & Plan Note (Signed)
Dr Gessner Colon due in 2024 

## 2022-06-25 NOTE — Assessment & Plan Note (Signed)
Will monitor CK. No myalgias 

## 2022-06-25 NOTE — Assessment & Plan Note (Addendum)
  We discussed age appropriate health related issues, including available/recomended screening tests and vaccinations. Labs were ordered to be later reviewed . All questions were answered. We discussed one or more of the following - seat belt use, use of sunscreen/sun exposure exercise, safe sex, fall risk reduction, second hand smoke exposure, firearm use and storage, seat belt use, a need for adhering to healthy diet and exercise. Labs were ordered.  All questions were answered.   Rectal per Dr Leone Payor - colon is due

## 2022-06-26 ENCOUNTER — Encounter: Payer: Self-pay | Admitting: Internal Medicine

## 2022-06-26 ENCOUNTER — Telehealth: Payer: Self-pay | Admitting: Internal Medicine

## 2022-06-26 DIAGNOSIS — D649 Anemia, unspecified: Secondary | ICD-10-CM | POA: Insufficient documentation

## 2022-06-26 NOTE — Telephone Encounter (Signed)
Patient saw Dr. Loren Racer message to him about his recent labs. He wants to make sure that Dr. Posey Rea wants him to schedule three months from now and not ASAP. Best callback is [phone].

## 2022-06-26 NOTE — Addendum Note (Signed)
Addended by: Tresa Garter on: 06/26/2022 07:47 AM   Modules accepted: Orders

## 2022-06-26 NOTE — Assessment & Plan Note (Signed)
Decreased hemoglobin and white cell count Repeat CBC in 3 months.  Office visit with results

## 2022-06-27 NOTE — Telephone Encounter (Signed)
Called pt inform him MD states to have labs repeated in 3 month. Made lab appt 10/01/22...Raechel Chute

## 2022-06-29 ENCOUNTER — Ambulatory Visit (AMBULATORY_SURGERY_CENTER): Payer: Medicare PPO

## 2022-06-29 ENCOUNTER — Encounter: Payer: Self-pay | Admitting: Internal Medicine

## 2022-06-29 VITALS — Ht 71.0 in | Wt 225.0 lb

## 2022-06-29 DIAGNOSIS — Z86010 Personal history of colonic polyps: Secondary | ICD-10-CM

## 2022-06-29 NOTE — Progress Notes (Signed)
Pre visit completed via phone call; Patient verified name, DOB, and address;  No egg or soy allergy known to patient;  No issues known to pt with past sedation with any surgeries or procedures; Patient denies ever being told they had issues or difficulty with intubation;  No FH of Malignant Hyperthermia; Pt is not on diet pills; Pt is not on home 02;  Pt is not on blood thinners;  Pt denies issues with constipation;  No A fib or A flutter; Have any cardiac testing pending--NO Pt instructed to use Singlecare.com or GoodRx for a price reduction on prep;   Insurance verified during PV appt=[employer]  Patient's chart reviewed by Cathlyn Parsons CNRA prior to previsit and patient appropriate for the [facility].  Previsit completed and red dot placed by patient's name on their procedure day (on provider's schedule).    Instructions printed ana mailed to patient per his request;

## 2022-07-06 DIAGNOSIS — G4733 Obstructive sleep apnea (adult) (pediatric): Secondary | ICD-10-CM | POA: Diagnosis not present

## 2022-07-20 ENCOUNTER — Encounter: Payer: Medicare PPO | Admitting: Internal Medicine

## 2022-07-24 ENCOUNTER — Encounter: Payer: Self-pay | Admitting: Internal Medicine

## 2022-08-19 ENCOUNTER — Other Ambulatory Visit: Payer: Self-pay | Admitting: Internal Medicine

## 2022-08-31 ENCOUNTER — Other Ambulatory Visit (INDEPENDENT_AMBULATORY_CARE_PROVIDER_SITE_OTHER): Payer: Medicare PPO

## 2022-08-31 DIAGNOSIS — N1831 Chronic kidney disease, stage 3a: Secondary | ICD-10-CM | POA: Diagnosis not present

## 2022-08-31 DIAGNOSIS — D126 Benign neoplasm of colon, unspecified: Secondary | ICD-10-CM | POA: Diagnosis not present

## 2022-08-31 DIAGNOSIS — D649 Anemia, unspecified: Secondary | ICD-10-CM

## 2022-08-31 LAB — CBC WITH DIFFERENTIAL/PLATELET
Basophils Absolute: 0 10*3/uL (ref 0.0–0.1)
Basophils Relative: 0.8 % (ref 0.0–3.0)
Eosinophils Absolute: 0.3 10*3/uL (ref 0.0–0.7)
Eosinophils Relative: 7.8 % — ABNORMAL HIGH (ref 0.0–5.0)
HCT: 39 % (ref 39.0–52.0)
Hemoglobin: 12.9 g/dL — ABNORMAL LOW (ref 13.0–17.0)
Lymphocytes Relative: 39.8 % (ref 12.0–46.0)
Lymphs Abs: 1.4 10*3/uL (ref 0.7–4.0)
MCHC: 33.2 g/dL (ref 30.0–36.0)
MCV: 83.8 fl (ref 78.0–100.0)
Monocytes Absolute: 0.4 10*3/uL (ref 0.1–1.0)
Monocytes Relative: 11.1 % (ref 3.0–12.0)
Neutro Abs: 1.5 10*3/uL (ref 1.4–7.7)
Neutrophils Relative %: 40.5 % — ABNORMAL LOW (ref 43.0–77.0)
Platelets: 222 10*3/uL (ref 150.0–400.0)
RBC: 4.65 Mil/uL (ref 4.22–5.81)
RDW: 14.3 % (ref 11.5–15.5)
WBC: 3.6 10*3/uL — ABNORMAL LOW (ref 4.0–10.5)

## 2022-08-31 LAB — COMPREHENSIVE METABOLIC PANEL
ALT: 28 U/L (ref 0–53)
AST: 39 U/L — ABNORMAL HIGH (ref 0–37)
Albumin: 4.3 g/dL (ref 3.5–5.2)
Alkaline Phosphatase: 62 U/L (ref 39–117)
BUN: 16 mg/dL (ref 6–23)
CO2: 25 mEq/L (ref 19–32)
Calcium: 9.9 mg/dL (ref 8.4–10.5)
Chloride: 104 mEq/L (ref 96–112)
Creatinine, Ser: 1.36 mg/dL (ref 0.40–1.50)
GFR: 51.94 mL/min — ABNORMAL LOW (ref >60.00)
Glucose, Bld: 98 mg/dL (ref 70–99)
Potassium: 3.8 mEq/L (ref 3.5–5.1)
Sodium: 137 mEq/L (ref 135–145)
Total Bilirubin: 0.6 mg/dL (ref 0.2–1.2)
Total Protein: 7.5 g/dL (ref 6.0–8.3)

## 2022-09-01 LAB — IRON,TIBC AND FERRITIN PANEL
%SAT: 27 % (ref 20–48)
Ferritin: 33 ng/mL (ref 24–380)
Iron: 135 ug/dL (ref 50–180)
TIBC: 491 ug/dL — ABNORMAL HIGH (ref 250–425)

## 2022-09-10 ENCOUNTER — Other Ambulatory Visit: Payer: Self-pay | Admitting: Internal Medicine

## 2022-09-10 IMAGING — US US ABDOMEN COMPLETE
1 series · 14 of 25 positions shown · non-contrast
Comparison: CT abdomen pelvis 07/08/2009

CLINICAL DATA: Chronic renal impairment

Elevated liver function tests
EXAM:
ABDOMEN ULTRASOUND COMPLETE

[Series 1: us abdomen complete · 0.20mm/px · 14 of 92 slices shown]
[im 1/92]
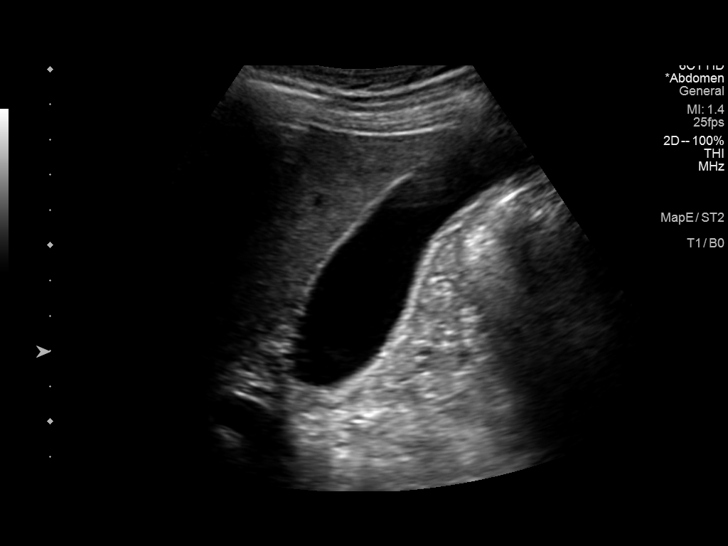
[im 8/92]
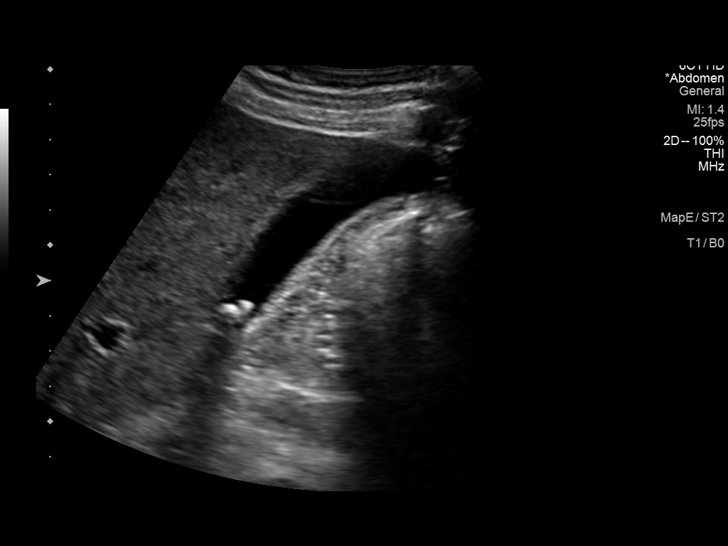
[im 16/92]
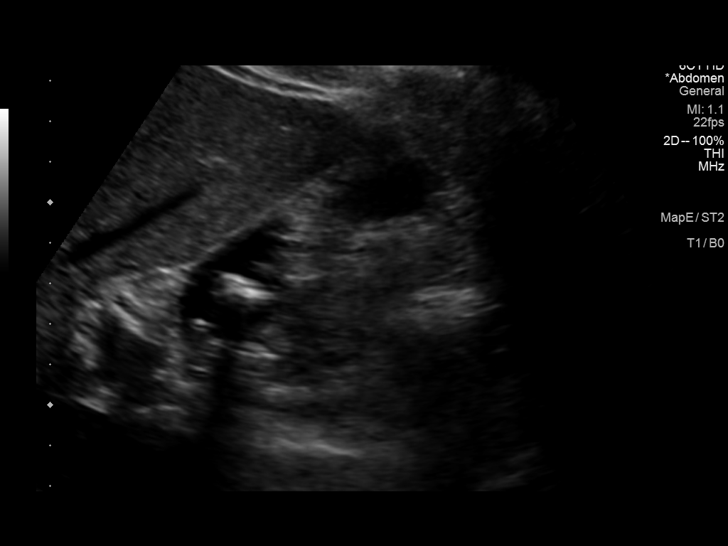
[im 23/92]
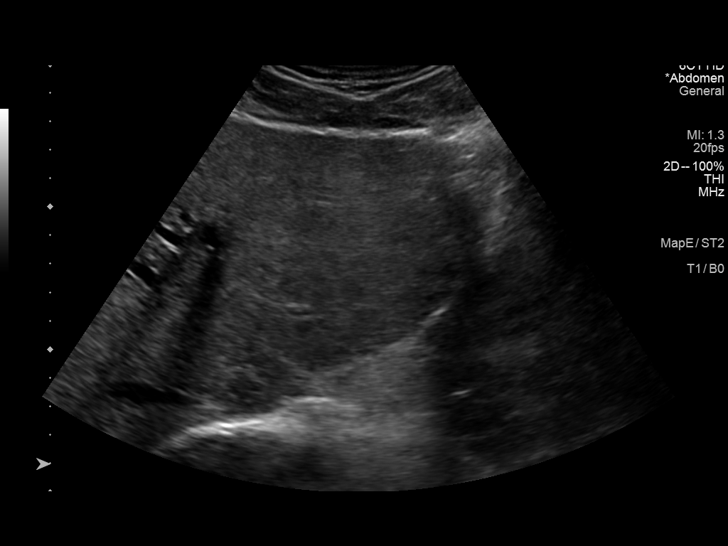
[im 31/92]
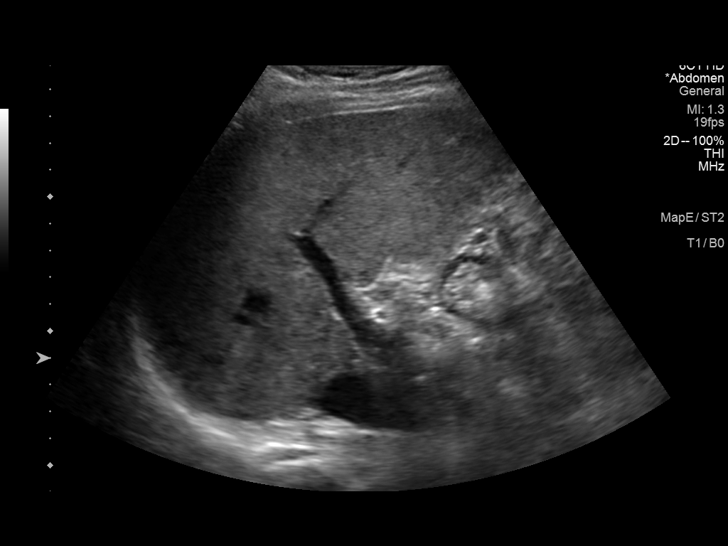
[im 35/92]
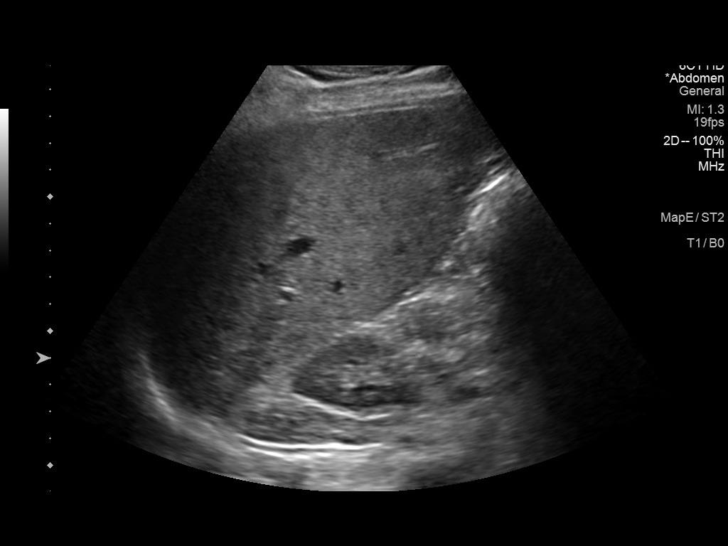
[im 42/92]
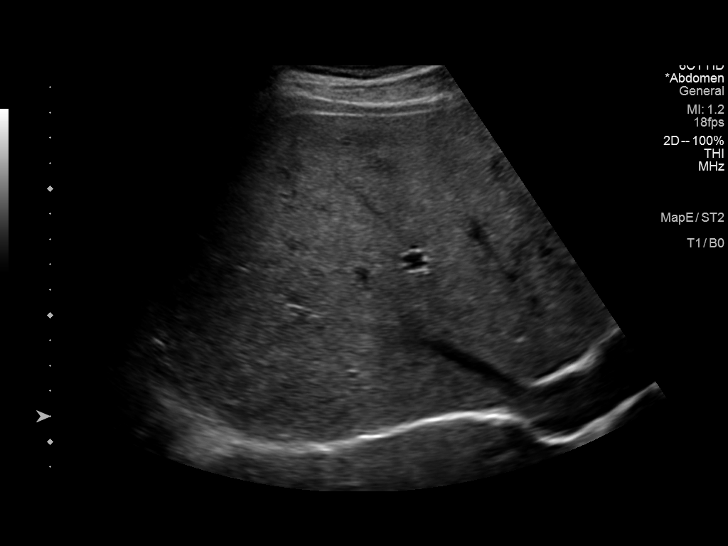
[im 50/92]
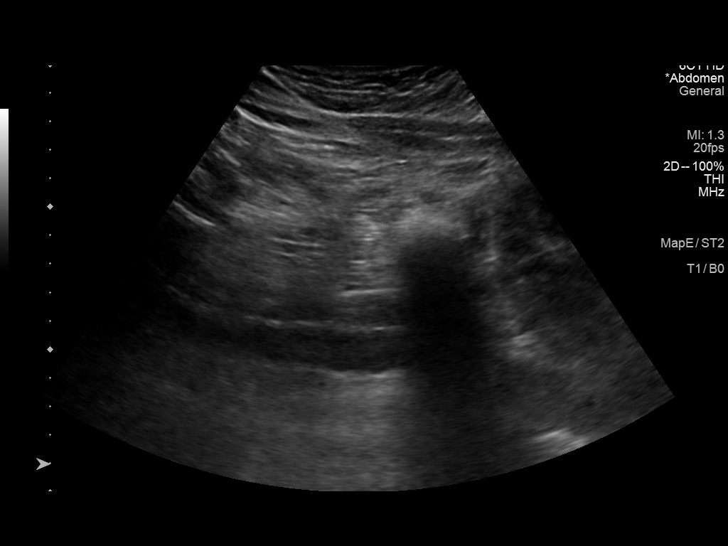
[im 57/92]
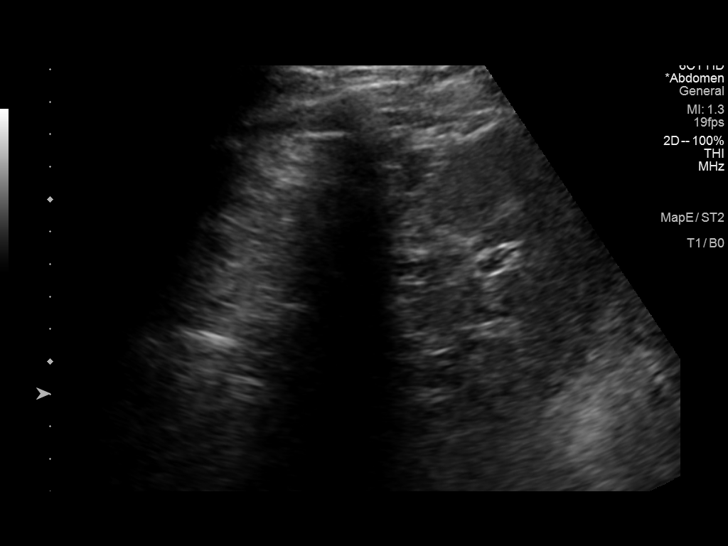
[im 61/92]
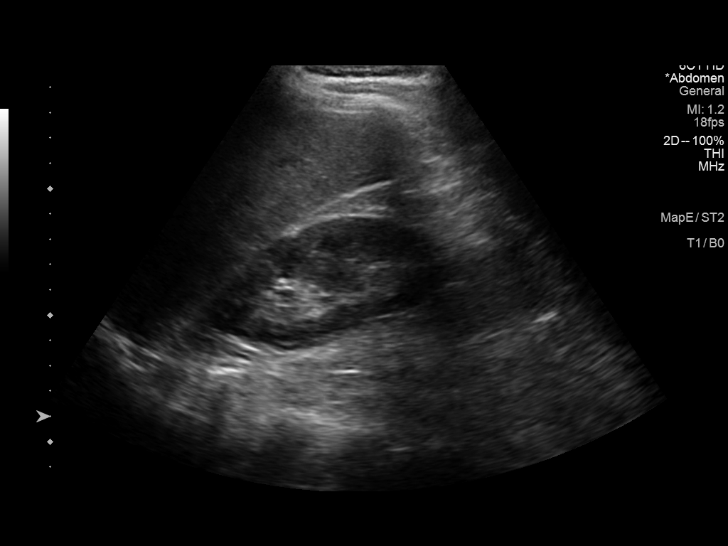
[im 69/92]
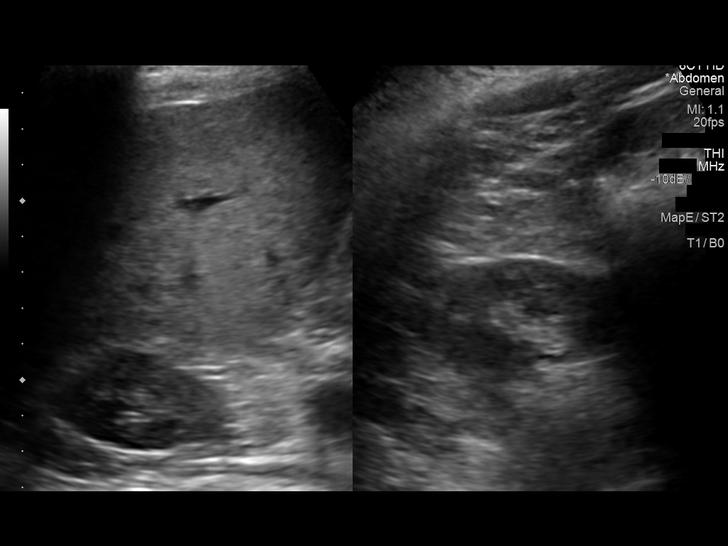
[im 76/92]
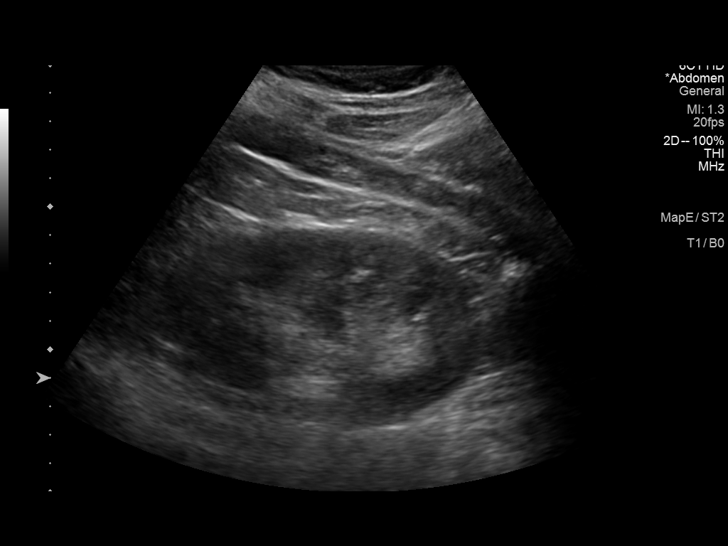
[im 84/92]
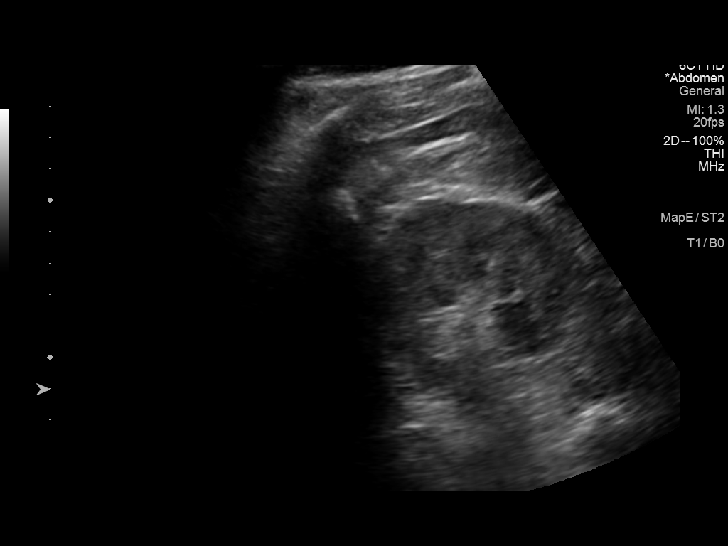
[im 92/92]
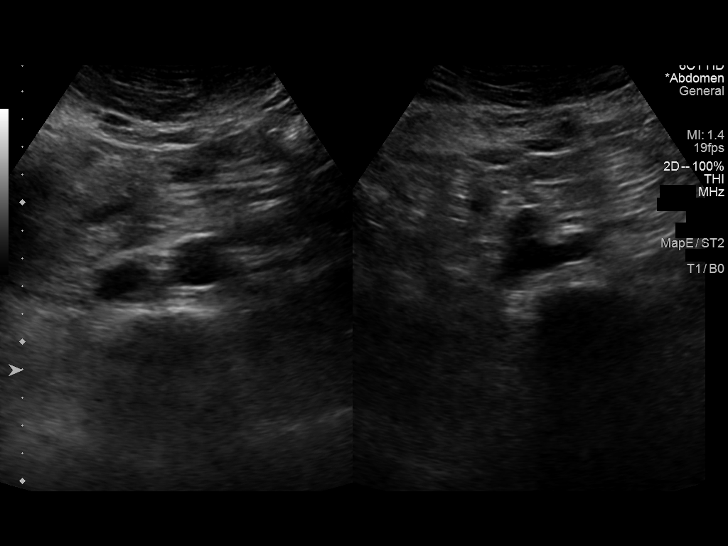

[14 of 25 positions shown; findings below may reference images not displayed]

FINDINGS: Gallbladder: Cholelithiasis. No gallbladder wall thickening or
pericholecystic fluid. Sonographic [name] sign is negative per
technologist.

Common bile duct: Diameter: 3 mm

Liver: No focal lesion identified. Within normal limits in
parenchymal echogenicity. Portal vein is patent on color Doppler
imaging with normal direction of blood flow towards the liver.

IVC: No abnormality visualized.

Pancreas: Visualized portion unremarkable.

Spleen: Size and appearance within normal limits.

Right Kidney: Length: 11.8 cm. Echogenicity within normal limits. No
mass or hydronephrosis visualized.

Left Kidney: Length: 12.0 cm. Echogenicity within normal limits. No
mass or hydronephrosis visualized.

Abdominal aorta: No aneurysm visualized.

Other findings: None.
IMPRESSION: Cholelithiasis without additional sonographic evidence of acute
cholecystitis.

## 2022-09-16 ENCOUNTER — Other Ambulatory Visit: Payer: Self-pay | Admitting: Internal Medicine

## 2022-09-29 DIAGNOSIS — H01001 Unspecified blepharitis right upper eyelid: Secondary | ICD-10-CM | POA: Diagnosis not present

## 2022-10-01 ENCOUNTER — Other Ambulatory Visit: Payer: Medicare PPO

## 2022-10-02 ENCOUNTER — Encounter: Payer: Self-pay | Admitting: Internal Medicine

## 2022-10-02 ENCOUNTER — Ambulatory Visit: Payer: Medicare PPO | Admitting: Internal Medicine

## 2022-10-02 VITALS — BP 120/78 | HR 58 | Temp 98.0°F | Ht 71.0 in | Wt 227.0 lb

## 2022-10-02 DIAGNOSIS — N1831 Chronic kidney disease, stage 3a: Secondary | ICD-10-CM | POA: Diagnosis not present

## 2022-10-02 DIAGNOSIS — I2510 Atherosclerotic heart disease of native coronary artery without angina pectoris: Secondary | ICD-10-CM | POA: Insufficient documentation

## 2022-10-02 DIAGNOSIS — E785 Hyperlipidemia, unspecified: Secondary | ICD-10-CM | POA: Diagnosis not present

## 2022-10-02 DIAGNOSIS — R0789 Other chest pain: Secondary | ICD-10-CM | POA: Insufficient documentation

## 2022-10-02 DIAGNOSIS — H00011 Hordeolum externum right upper eyelid: Secondary | ICD-10-CM | POA: Diagnosis not present

## 2022-10-02 DIAGNOSIS — I10 Essential (primary) hypertension: Secondary | ICD-10-CM | POA: Diagnosis not present

## 2022-10-02 DIAGNOSIS — H00019 Hordeolum externum unspecified eye, unspecified eyelid: Secondary | ICD-10-CM | POA: Insufficient documentation

## 2022-10-02 DIAGNOSIS — I2583 Coronary atherosclerosis due to lipid rich plaque: Secondary | ICD-10-CM | POA: Diagnosis not present

## 2022-10-02 NOTE — Assessment & Plan Note (Addendum)
  A cardiac CT scan for coronary calcium score is 143 in 7/22 On ASA, Pravastatin

## 2022-10-02 NOTE — Assessment & Plan Note (Signed)
New R upper eyelid is swollen. Pt went to UC - given Tobra eye drops and a po abx qid  - started on Sat: pls finish the course. Ophth appt

## 2022-10-02 NOTE — Assessment & Plan Note (Signed)
  A cardiac CT scan for coronary calcium score is 143 in 7/22 On ASA, Pravastatin

## 2022-10-02 NOTE — Assessment & Plan Note (Signed)
BP Readings from Last 3 Encounters:  10/02/22 120/78  06/25/22 130/80  11/07/21 (!) 152/86

## 2022-10-02 NOTE — Assessment & Plan Note (Signed)
Hydrate well 

## 2022-10-02 NOTE — Progress Notes (Signed)
Subjective:  Patient ID: Robert [name], male    DOB: 04-16-1950  Age: 72 y.o. MRN: [id number]  CC: Follow-up   HPI Robert [name] presents for R upper eyelid is swollen. Pt went to UC - given Tobra eye drops and a po abx qid  - started on Sat  C/o "chest pulling" w/exercise x 2 weeks  Outpatient Medications Prior to Visit  Medication Sig Dispense Refill   ALPRAZolam (XANAX) 0.5 MG tablet Take 1 tablet (0.5 mg total) by mouth 2 (two) times daily as needed for anxiety. 180 tablet 1   Ascorbic Acid (VITAMIN C) 100 MG tablet Take 100 mg by mouth daily.     aspirin 81 MG tablet Take 81 mg by mouth daily.     Cholecalciferol (VITAMIN D3) 2000 units capsule Take 1 capsule (2,000 Units total) by mouth daily. 100 capsule 3   Ferrous Sulfate (IRON PO) Take 1 tablet by mouth daily.     gemfibrozil (LOPID) 600 MG tablet TAKE 1 TABLET BY MOUTH TWICE DAILY 180 tablet 1   GINSENG PO Take 3 tablets by mouth daily.     KRILL OIL PO Take 1 capsule by mouth daily.     levocetirizine (XYZAL) 5 MG tablet TAKE 1 TABLET(5 MG) BY MOUTH EVERY EVENING 90 tablet 3   methocarbamol (ROBAXIN) 500 MG tablet Take 1 tablet (500 mg total) by mouth every 8 (eight) hours as needed for muscle spasms. 20 tablet 0   metoprolol succinate (TOPROL-XL) 100 MG 24 hr tablet TAKE 1 TABLET(100 MG) BY MOUTH DAILY WITH OR IMMEDIATELY FOLLOWING A MEAL 90 tablet 3   Olmesartan-amLODIPine-HCTZ 40-5-12.5 MG TABS TAKE 1 TABLET BY MOUTH EVERY DAY(ANNUAL APPOINTMENT IS DUE, MUST SEE PROVIDER FOR FUTURE REFILLS) 30 tablet 0   omeprazole (PRILOSEC) 20 MG capsule TAKE 1 CAPSULE BY MOUTH TWICE DAILY BEFORE A MEAL. 180 capsule 2   pravastatin (PRAVACHOL) 20 MG tablet TAKE 1 TABLET(20 MG) BY MOUTH DAILY 90 tablet 3   tamsulosin (FLOMAX) 0.4 MG CAPS capsule TAKE 1 CAPSULE BY MOUTH EVERY DAY AFTER SUPPER 90 capsule 3   tobramycin (TOBREX) 0.3 % ophthalmic solution SMARTSIG:In Eye(s)     sildenafil (VIAGRA) 100 MG tablet Take 0.5-1 tablets (50-100 mg  total) by mouth daily as needed for erectile dysfunction. (Patient not taking: Reported on 06/29/2022) 20 tablet 5   No facility-administered medications prior to visit.    ROS: Review of Systems  Constitutional:  Negative for appetite change, fatigue and unexpected weight change.  HENT:  Negative for congestion, nosebleeds, sneezing, sore throat and trouble swallowing.   Eyes:  Positive for redness. Negative for photophobia, itching and visual disturbance.  Respiratory:  Negative for cough.   Cardiovascular:  Positive for chest pain. Negative for palpitations and leg swelling.  Gastrointestinal:  Negative for abdominal distention, blood in stool, diarrhea and nausea.  Genitourinary:  Negative for frequency and hematuria.  Musculoskeletal:  Negative for back pain, gait problem, joint swelling and neck pain.  Skin:  Negative for rash.  Neurological:  Negative for dizziness, tremors, speech difficulty and weakness.  Psychiatric/Behavioral:  Negative for agitation, dysphoric mood and sleep disturbance. The patient is not nervous/anxious.     Objective:  BP 120/78 (BP Location: Right Arm, Patient Position: Sitting, Cuff Size: Normal)   Pulse (!) 58   Temp 98 F (36.7 C) (Oral)   Ht 5\' 11"  (1.803 m)   Wt 227 lb (103 kg)   SpO2 96%   BMI 31.66 kg/m   BP  Readings from Last 3 Encounters:  10/02/22 120/78  06/25/22 130/80  11/07/21 (!) 152/86    Wt Readings from Last 3 Encounters:  10/02/22 227 lb (103 kg)  06/29/22 225 lb (102.1 kg)  06/25/22 227 lb (103 kg)    Physical Exam Constitutional:      General: He is not in acute distress.    Appearance: Normal appearance. He is well-developed.     Comments: NAD  Eyes:     Conjunctiva/sclera: Conjunctivae normal.     Pupils: Pupils are equal, round, and reactive to light.  Neck:     Thyroid: No thyromegaly.     Vascular: No JVD.  Cardiovascular:     Rate and Rhythm: Normal rate and regular rhythm.     Heart sounds: Normal  heart sounds. No murmur heard.    No friction rub. No gallop.  Pulmonary:     Effort: Pulmonary effort is normal. No respiratory distress.     Breath sounds: Normal breath sounds. No wheezing or rales.  Chest:     Chest wall: No tenderness.  Abdominal:     General: Bowel sounds are normal. There is no distension.     Palpations: Abdomen is soft. There is no mass.     Tenderness: There is no abdominal tenderness. There is no guarding or rebound.  Musculoskeletal:        General: No tenderness. Normal range of motion.     Cervical back: Normal range of motion.  Lymphadenopathy:     Cervical: No cervical adenopathy.  Skin:    General: Skin is warm and dry.     Findings: No rash.  Neurological:     Mental Status: He is alert and oriented to person, place, and time.     Cranial Nerves: No cranial nerve deficit.     Motor: No abnormal muscle tone.     Coordination: Coordination normal.     Gait: Gait normal.     Deep Tendon Reflexes: Reflexes are normal and symmetric.  Psychiatric:        Behavior: Behavior normal.        Thought Content: Thought content normal.        Judgment: Judgment normal.   Swollen R upper lid, NT, no mass  Lab Results  Component Value Date   WBC 3.6 (L) 08/31/2022   HGB 12.9 (L) 08/31/2022   HCT 39.0 08/31/2022   PLT 222.0 08/31/2022   GLUCOSE 98 08/31/2022   CHOL 124 06/25/2022   TRIG 108.0 06/25/2022   HDL 48.10 06/25/2022   LDLDIRECT 47.0 10/12/2020   LDLCALC 54 06/25/2022   ALT 28 08/31/2022   AST 39 (H) 08/31/2022   NA 137 08/31/2022   K 3.8 08/31/2022   CL 104 08/31/2022   CREATININE 1.36 08/31/2022   BUN 16 08/31/2022   CO2 25 08/31/2022   TSH 2.16 06/25/2022   PSA 1.82 06/25/2022   HGBA1C 6.3 04/10/2021    CT ANGIO HEAD NECK W WO CM  Result Date: 07/27/2021 CLINICAL DATA:  Neck trauma, impaired range of motion. EXAM: CT ANGIOGRAPHY HEAD AND NECK TECHNIQUE: Multidetector CT imaging of the head and neck was performed using the  standard protocol during bolus administration of intravenous contrast. Multiplanar CT image reconstructions and MIPs were obtained to evaluate the vascular anatomy. Carotid stenosis measurements (when applicable) are obtained utilizing NASCET criteria, using the distal internal carotid diameter as the denominator. RADIATION DOSE REDUCTION: This exam was performed according to the departmental dose-optimization program which  includes automated exposure control, adjustment of the mA and/or kV according to patient size and/or use of iterative reconstruction technique. CONTRAST:  75mL OMNIPAQUE IOHEXOL 350 MG/ML SOLN COMPARISON:  Head CT April 26, 2003. FINDINGS: CT HEAD FINDINGS Brain: No evidence of acute infarction, hemorrhage, hydrocephalus, extra-axial collection or mass lesion/mass effect. Vascular: No hyperdense vessel or unexpected calcification. Skull: Normal. Negative for fracture or focal lesion. Sinuses: Mucosal thickening with bubbly secretion within the bilateral ethmoid cells and frontal sinuses. Orbits: No acute finding. Review of the MIP images confirms the above findings CTA NECK FINDINGS Aortic arch: Standard branching. Imaged portion shows no evidence of aneurysm or dissection. No significant stenosis of the major arch vessel origins. Right carotid system: Mild atherosclerotic changes in the right carotid bifurcation without hemodynamically significant stenosis. Left carotid system: Mild atherosclerotic changes in the left carotid bifurcation without hemodynamically significant stenosis. Vertebral arteries: Evaluation of the V1 segment of the left vertebral artery is precluded by artifact from dense contrast in adjacent vein. The most distal portion of the left A1 segment and the remainder of the left vertebral artery showed no evidence of occlusion or stenosis. The right vertebral artery has normal course and caliber throughout its entire course. The Skeleton: No acute or aggressive process  identified. Other neck: Negative. Upper chest: Negative Review of the MIP images confirms the above findings CTA HEAD FINDINGS Anterior circulation: No significant stenosis, proximal occlusion, aneurysm, or vascular malformation. Posterior circulation: No significant stenosis, proximal occlusion, aneurysm, or vascular malformation. Venous sinuses: As permitted by contrast timing, patent. Anatomic variants: None significant. Review of the MIP images confirms the above findings IMPRESSION: 1. No acute intracranial abnormality. 2. No intracranial large vessel occlusion or hemodynamically significant stenosis. 3. Mild atherosclerotic changes of the bilateral carotid bifurcations without hemodynamically significant stenosis. 4. No evidence of traumatic vascular injury to the major neck arteries. Electronically Signed   By: Baldemar Lenis M.D.   On: 07/27/2021 15:45    Assessment & Plan:   Problem List Items Addressed This Visit     Dyslipidemia     A cardiac CT scan for coronary calcium score is 143 in 7/22 On ASA, Pravastatin      Essential hypertension    BP Readings from Last 3 Encounters:  10/02/22 120/78  06/25/22 130/80  11/07/21 (!) 152/86         Chronic renal impairment, stage 3a (HCC)    Hydrate well      Stye - Primary    New R upper eyelid is swollen. Pt went to UC - given Tobra eye drops and a po abx qid  - started on Sat: pls finish the course. Ophth appt       Relevant Orders   Ambulatory referral to Ophthalmology   Chest pain, atypical    C/o "chest pulling" w/exercise x 2 weeks Will ref to Cardiology      Relevant Orders   Ambulatory referral to Cardiology   Coronary atherosclerosis     A cardiac CT scan for coronary calcium score is 143 in 7/22 On ASA, Pravastatin      Relevant Orders   Ambulatory referral to Cardiology      No orders of the defined types were placed in this encounter.     Follow-up: Return in about 3 months (around  01/02/2023) for a follow-up visit.  Sonda Primes, MD

## 2022-10-02 NOTE — Assessment & Plan Note (Signed)
C/o "chest pulling" w/exercise x 2 weeks Will ref to Cardiology

## 2022-10-05 ENCOUNTER — Ambulatory Visit (AMBULATORY_SURGERY_CENTER): Payer: Medicare PPO | Admitting: *Deleted

## 2022-10-05 VITALS — Ht 71.0 in | Wt 226.0 lb

## 2022-10-05 DIAGNOSIS — Z860100 Personal history of colon polyps, unspecified: Secondary | ICD-10-CM

## 2022-10-05 NOTE — Progress Notes (Signed)
Pt's name and DOB verified at the beginning of the pre-visit wit 2 identifiers  Pt denies any difficulty with ambulating,sitting, laying down or rolling side to side  Gave both [facility] main # and MD on call # prior to instructions.   No egg or soy allergy known to patient   No issues known to pt with past sedation with any surgeries or procedures  Pt denies having issues being intubated  Pt has no issues moving head neck or swallowing  No FH of Malignant Hyperthermia  Pt is not on diet pills or shots  Pt is not on home 02   Pt is not on blood thinners   Pt denies issues with constipation   Pt is not on dialysis  Pt denise any abnormal heart rhythms   Pt denies any upcoming cardiac testing  Pt encouraged to use to use Singlecare or Goodrx to reduce cost   Patient's chart reviewed by Cathlyn Parsons CNRA prior to pre-visit and patient appropriate for the [facility].  Pre-visit completed and red dot placed by patient's name on their procedure day (on provider's schedule).  .  Visit by phone  Pt states weight is 226 lb  Instructed pt why it is important to and  to call if they have any changes in health or new medications. Directed them to the # given and on instructions.     Instructions reviewed with pt and pt states understanding. Instructed to review again prior to procedure. Pt states they will.   Instructions sent by mail with coupon and by my chart

## 2022-10-08 ENCOUNTER — Ambulatory Visit: Payer: Medicare PPO | Admitting: Internal Medicine

## 2022-10-12 ENCOUNTER — Encounter: Payer: Self-pay | Admitting: Internal Medicine

## 2022-10-19 ENCOUNTER — Encounter: Payer: Medicare PPO | Admitting: Internal Medicine

## 2022-10-19 ENCOUNTER — Other Ambulatory Visit: Payer: Self-pay | Admitting: Internal Medicine

## 2022-10-20 ENCOUNTER — Other Ambulatory Visit: Payer: Self-pay | Admitting: Internal Medicine

## 2022-10-25 NOTE — Progress Notes (Signed)
[facility] History and Physical   Primary Care Physician:  Plotnikov, Georgina Quint, MD   Reason for Procedure:  History of colon polyps  Plan:    Colonoscopy     HPI: Robert [name] is a 72 y.o. male with a history of colon polyps here for a surveillance colonoscopy.  Last exam in 2019 as follows:  - One 2 to 3 mm polyp in the transverse colon,                            removed with a cold biopsy forceps. Resected and                            retrieved.                           - Patent surgical anastomosis, characterized by                            healthy appearing mucosa.                           - The examination was otherwise normal on direct                            and retroflexion views.                           - Personal history of colonic polyp TV adenoma                            resected 2011. Past Medical History:  Diagnosis Date   Allergy    Anemia    on meds   Anxiety    ED (erectile dysfunction)    GERD (gastroesophageal reflux disease)    on meds   History of gastritis    History of UTI    Hx of colonic polyps    Hyperlipidemia    on meds   Hypertension    on meds   IBS (irritable bowel syndrome)    Myalgia    Sleep apnea    uses CPAP   Venous insufficiency     Past Surgical History:  Procedure Laterality Date   APPENDECTOMY     COLON SURGERY  07/2009   Dr. Neita Carp tubulovillous adenoma   COLONOSCOPY  2019   CG-MAC-miralax(exc)-TA   POLYPECTOMY  2019   TA   VASECTOMY      Prior to Admission medications   Medication Sig Start Date End Date Taking? Authorizing Provider  ALPRAZolam Prudy Feeler) 0.5 MG tablet Take 1 tablet (0.5 mg total) by mouth 2 (two) times daily as needed for anxiety. Patient not taking: Reported on 10/05/2022 03/26/18   Plotnikov, Georgina Quint, MD  Ascorbic Acid (VITAMIN C) 100 MG tablet Take 100 mg by mouth daily.    [provider]  aspirin 81 MG tablet Take 81 mg by mouth daily.    [provider]  Cholecalciferol (VITAMIN D3) 2000 units capsule Take 1 capsule (2,000 Units total) by mouth daily. 03/12/16   Plotnikov, Georgina Quint, MD  Ferrous Sulfate (IRON PO) Take 1 tablet by mouth daily.  [provider]  gemfibrozil (LOPID) 600 MG tablet TAKE 1 TABLET BY MOUTH TWICE DAILY 08/20/22   Plotnikov, Georgina Quint, MD  GINSENG PO Take 3 tablets by mouth daily.    [provider]  KRILL OIL PO Take 1 capsule by mouth daily.    [provider]  levocetirizine (XYZAL) 5 MG tablet TAKE 1 TABLET(5 MG) BY MOUTH EVERY EVENING 09/10/22   Plotnikov, Georgina Quint, MD  methocarbamol (ROBAXIN) 500 MG tablet Take 1 tablet (500 mg total) by mouth every 8 (eight) hours as needed for muscle spasms. 07/27/21   Rancour, Jeannett Senior, MD  metoprolol succinate (TOPROL-XL) 100 MG 24 hr tablet TAKE 1 TABLET(100 MG) BY MOUTH DAILY WITH OR IMMEDIATELY FOLLOWING A MEAL 09/10/22   Plotnikov, Georgina Quint, MD  Olmesartan-amLODIPine-HCTZ 40-5-12.5 MG TABS TAKE 1 TABLET BY MOUTH EVERY DAY(ANNUAL APPOINTMENT IS DUE, MUST SEE PROVIDER FOR FUTURE REFILLS) 10/22/22   Plotnikov, Georgina Quint, MD  omeprazole (PRILOSEC) 20 MG capsule TAKE 1 CAPSULE BY MOUTH TWICE DAILY BEFORE A MEAL. 09/10/22   Plotnikov, Georgina Quint, MD  pravastatin (PRAVACHOL) 20 MG tablet TAKE 1 TABLET(20 MG) BY MOUTH DAILY 09/10/22   Plotnikov, Georgina Quint, MD  sildenafil (VIAGRA) 100 MG tablet Take 0.5-1 tablets (50-100 mg total) by mouth daily as needed for erectile dysfunction. 08/21/21   Plotnikov, Georgina Quint, MD  tamsulosin (FLOMAX) 0.4 MG CAPS capsule TAKE 1 CAPSULE BY MOUTH EVERY DAY AFTER SUPPER 10/22/22   Plotnikov, Georgina Quint, MD  tobramycin (TOBREX) 0.3 % ophthalmic solution SMARTSIG:In Eye(s) 09/29/22   [provider]    Current Outpatient Medications  Medication Sig Dispense Refill   Ascorbic Acid (VITAMIN C) 100 MG tablet Take 100 mg by mouth daily.     aspirin 81 MG tablet Take 81 mg by mouth daily.     Cholecalciferol (VITAMIN  D3) 2000 units capsule Take 1 capsule (2,000 Units total) by mouth daily. 100 capsule 3   Ferrous Sulfate (IRON PO) Take 1 tablet by mouth daily.     gemfibrozil (LOPID) 600 MG tablet TAKE 1 TABLET BY MOUTH TWICE DAILY 180 tablet 1   GINSENG PO Take 3 tablets by mouth daily.     KRILL OIL PO Take 1 capsule by mouth daily.     levocetirizine (XYZAL) 5 MG tablet TAKE 1 TABLET(5 MG) BY MOUTH EVERY EVENING 90 tablet 3   methocarbamol (ROBAXIN) 500 MG tablet Take 1 tablet (500 mg total) by mouth every 8 (eight) hours as needed for muscle spasms. 20 tablet 0   metoprolol succinate (TOPROL-XL) 100 MG 24 hr tablet TAKE 1 TABLET(100 MG) BY MOUTH DAILY WITH OR IMMEDIATELY FOLLOWING A MEAL 90 tablet 3   Olmesartan-amLODIPine-HCTZ 40-5-12.5 MG TABS TAKE 1 TABLET BY MOUTH EVERY DAY(ANNUAL APPOINTMENT IS DUE, MUST SEE PROVIDER FOR FUTURE REFILLS) 90 tablet 3   omeprazole (PRILOSEC) 20 MG capsule TAKE 1 CAPSULE BY MOUTH TWICE DAILY BEFORE A MEAL. 180 capsule 2   pravastatin (PRAVACHOL) 20 MG tablet TAKE 1 TABLET(20 MG) BY MOUTH DAILY 90 tablet 3   sildenafil (VIAGRA) 100 MG tablet Take 0.5-1 tablets (50-100 mg total) by mouth daily as needed for erectile dysfunction. 20 tablet 5   tamsulosin (FLOMAX) 0.4 MG CAPS capsule TAKE 1 CAPSULE BY MOUTH EVERY DAY AFTER SUPPER 90 capsule 3   tobramycin (TOBREX) 0.3 % ophthalmic solution SMARTSIG:In Eye(s)     ALPRAZolam (XANAX) 0.5 MG tablet Take 1 tablet (0.5 mg total) by mouth 2 (two) times daily as needed for anxiety. (  Patient not taking: Reported on 10/05/2022) 180 tablet 1   Current Facility-Administered Medications  Medication Dose Route Frequency Provider Last Rate Last Admin   0.9 %  sodium chloride infusion  500 mL Intravenous Once Iva Boop, MD        Allergies as of 10/26/2022 - Review Complete 10/26/2022  Allergen Reaction Noted   Atorvastatin     Statins Other (See Comments) 06/29/2022    Family History  Problem Relation Age of Onset   Colon  cancer Cousin    Colon polyps Neg Hx    Pancreatic cancer Neg Hx    Rectal cancer Neg Hx    Stomach cancer Neg Hx    Esophageal cancer Neg Hx    Liver cancer Neg Hx    Prostate cancer Neg Hx     Social History   Socioeconomic History   Marital status: Married    Spouse name: Not on file   Number of children: Not on file   Years of education: 16   Highest education level: Bachelor's degree (e.g., BA, AB, BS)  Occupational History   Occupation: ADM Technical sales engineer    Employer: [employer]  Tobacco Use   Smoking status: Former    Types: Cigars    Quit date: 04/02/2022    Years since quitting: 0.5   Smokeless tobacco: Never   Tobacco comments:    smokes one cigar once a month-01/27/20    04/02/2022 patient stated he quit smoking cigars-S.Hatfield, LPN  Vaping Use   Vaping status: Never Used  Substance and Sexual Activity   Alcohol use: Yes    Comment: social use, very occasional   Drug use: No   Sexual activity: Yes  Other Topics Concern   Not on file  Social History Narrative   Not on file   Social Determinants of Health   Financial Resource Strain: Low Risk  (04/02/2022)   Overall Financial Resource Strain (CARDIA)    Difficulty of Paying Living Expenses: Not hard at all  Food Insecurity: No Food Insecurity (04/02/2022)   Hunger Vital Sign    Worried About Running Out of Food in the Last Year: Never true    Ran Out of Food in the Last Year: Never true  Transportation Needs: No Transportation Needs (04/02/2022)   PRAPARE - Administrator, Civil Service (Medical): No    Lack of Transportation (Non-Medical): No  Physical Activity: Sufficiently Active (04/02/2022)   Exercise Vital Sign    Days of Exercise per Week: 3 days    Minutes of Exercise per Session: 120 min  Stress: No Stress Concern Present (04/02/2022)   [employer] of Occupational Health - Occupational Stress Questionnaire    Feeling of Stress : Not at all  Social Connections: Socially  Integrated (04/02/2022)   Social Connection and Isolation Panel [NHANES]    Frequency of Communication with Friends and Family: More than three times a week    Frequency of Social Gatherings with Friends and Family: More than three times a week    Attends Religious Services: More than 4 times per year    Active Member of [employer] or Organizations: Yes    Attends Banker Meetings: More than 4 times per year    Marital Status: Married  Catering manager Violence: Not At Risk (04/02/2022)   Humiliation, Afraid, Rape, and Kick questionnaire    Fear of Current or Ex-Partner: No    Emotionally Abused: No    Physically Abused: No  Sexually Abused: No    Review of Systems:  All other review of systems negative except as mentioned in the HPI.  Physical Exam: Vital signs BP (!) 146/70   Pulse (!) 53   Temp 98.4 F (36.9 C) (Temporal)   Ht 5\' 11"  (1.803 m)   Wt 226 lb (102.5 kg)   SpO2 98%   BMI 31.52 kg/m   General:   Alert,  Well-developed, well-nourished, pleasant and cooperative in NAD Lungs:  Clear throughout to auscultation.   Heart:  Regular rate and rhythm; no murmurs, clicks, rubs,  or gallops. Abdomen:  Soft, nontender and nondistended. Normal bowel sounds.   Neuro/Psych:  Alert and cooperative. Normal mood and affect. A and O x 3   @Hallelujah Wysong  Sena Slate, MD, [facility], [facility] [phone] (pager) 10/26/2022 8:51 AM@

## 2022-10-26 ENCOUNTER — Encounter: Payer: Self-pay | Admitting: Internal Medicine

## 2022-10-26 ENCOUNTER — Ambulatory Visit (AMBULATORY_SURGERY_CENTER): Payer: Medicare PPO | Admitting: Internal Medicine

## 2022-10-26 VITALS — BP 125/79 | HR 56 | Temp 98.4°F | Resp 11 | Ht 71.0 in | Wt 226.0 lb

## 2022-10-26 DIAGNOSIS — I10 Essential (primary) hypertension: Secondary | ICD-10-CM | POA: Diagnosis not present

## 2022-10-26 DIAGNOSIS — Z09 Encounter for follow-up examination after completed treatment for conditions other than malignant neoplasm: Secondary | ICD-10-CM

## 2022-10-26 DIAGNOSIS — E785 Hyperlipidemia, unspecified: Secondary | ICD-10-CM | POA: Diagnosis not present

## 2022-10-26 DIAGNOSIS — Z1211 Encounter for screening for malignant neoplasm of colon: Secondary | ICD-10-CM | POA: Diagnosis not present

## 2022-10-26 DIAGNOSIS — G4730 Sleep apnea, unspecified: Secondary | ICD-10-CM | POA: Diagnosis not present

## 2022-10-26 DIAGNOSIS — F419 Anxiety disorder, unspecified: Secondary | ICD-10-CM | POA: Diagnosis not present

## 2022-10-26 DIAGNOSIS — Z860100 Personal history of colon polyps, unspecified: Secondary | ICD-10-CM

## 2022-10-26 DIAGNOSIS — Z860101 Personal history of adenomatous and serrated colon polyps: Secondary | ICD-10-CM | POA: Diagnosis not present

## 2022-10-26 MED ORDER — SODIUM CHLORIDE 0.9 % IV SOLN: 500.0000 mL | Freq: Once | INTRAVENOUS | Status: DC

## 2022-10-26 NOTE — Progress Notes (Signed)
Vss nad trans to pacu 

## 2022-10-26 NOTE — Op Note (Signed)
[facility] Patient Name: Robert [name] Procedure Date: 10/26/2022 8:33 AM MRN: [id number] Endoscopist: Iva Boop , MD, [id number] Age: 72 Referring MD:  Date of Birth: 10/18/1950 Gender: Male Account #: [id number] Procedure:                Colonoscopy Indications:              Surveillance: Personal history of adenomatous                            polyps on last colonoscopy 5 years ago, Last                            colonoscopy: 2019 Medicines:                Monitored Anesthesia Care Procedure:                Pre-Anesthesia Assessment:                           - Prior to the procedure, a History and Physical                            was performed, and patient medications and                            allergies were reviewed. The patient's tolerance of                            previous anesthesia was also reviewed. The risks                            and benefits of the procedure and the sedation                            options and risks were discussed with the patient.                            All questions were answered, and informed consent                            was obtained. Prior Anticoagulants: The patient has                            taken no anticoagulant or antiplatelet agents. ASA                            Grade Assessment: III - A patient with severe                            systemic disease. After reviewing the risks and                            benefits, the patient was deemed in satisfactory  condition to undergo the procedure.                           After obtaining informed consent, the colonoscope                            was passed under direct vision. Throughout the                            procedure, the patient's blood pressure, pulse, and                            oxygen saturations were monitored continuously. The                            CF [id number] #[id number] was introduced through the  anus                            and advanced to the the cecum, identified by                            appendiceal orifice and ileocecal valve. The                            colonoscopy was performed without difficulty. The                            patient tolerated the procedure well. The quality                            of the bowel preparation was good. The rectum and                            Ileocolonic anastomsis areas were photographed. Scope In: 9:00:10 AM Scope Out: 9:10:21 AM Scope Withdrawal Time: 0 hours 6 minutes 32 seconds  Total Procedure Duration: 0 hours 10 minutes 11 seconds  Findings:                 The perianal and digital rectal examinations were                            normal.                           Internal hemorrhoids were found.                           There was evidence of a prior end-to-side                            ileo-colonic anastomosis in the transverse colon.                            This was characterized by healthy appearing mucosa.  The exam was otherwise without abnormality on                            direct and retroflexion views. Complications:            No immediate complications. Estimated Blood Loss:     Estimated blood loss: none. Impression:               - Internal hemorrhoids.                           - End-to-side ileo-colonic anastomosis,                            characterized by healthy appearing mucosa.                           - The examination was otherwise normal on direct                            and retroflexion views.                           - No specimens collected.                           - Personal history of colonic polyps. TV adenoma                            resected 2011 Recommendation:           - Patient has a contact number available for                            emergencies. The signs and symptoms of potential                            delayed complications  were discussed with the                            patient. Return to normal activities tomorrow.                            Written discharge instructions were provided to the                            patient.                           - Resume previous diet.                           - Continue present medications.                           - No repeat colonoscopy due to current age (18  years or older) and the absence of colonic polyps. Iva Boop, MD 10/26/2022 9:23:57 AM This report has been signed electronically.

## 2022-10-26 NOTE — Progress Notes (Signed)
Pt's states no medical or surgical changes since previsit or office visit. 

## 2022-10-26 NOTE — Patient Instructions (Addendum)
No polyps or cancer seen today.  Given the overall history, your age and expert guideline recommendations, I am not planning on any more routine, repeat colonoscopy for you.  If you have problems (hope not) then we can investigate.  I appreciate the opportunity to care for you. Iva Boop, MD, [facility]   Resume previous diet Continue present medications No need for routine colonoscopy in the future, call the office with any GI concern to discuss with Dr Leone Payor   YOU HAD AN ENDOSCOPIC PROCEDURE TODAY AT THE [facility]:   Refer to the procedure report that was given to you for any specific questions about what was found during the examination.  If the procedure report does not answer your questions, please call your gastroenterologist to clarify.  If you requested that your care partner not be given the details of your procedure findings, then the procedure report has been included in a sealed envelope for you to review at your convenience later.  YOU SHOULD EXPECT: Some feelings of bloating in the abdomen. Passage of more gas than usual.  Walking can help get rid of the air that was put into your GI tract during the procedure and reduce the bloating. If you had a lower endoscopy (such as a colonoscopy or flexible sigmoidoscopy) you may notice spotting of blood in your stool or on the toilet paper. If you underwent a bowel prep for your procedure, you may not have a normal bowel movement for a few days.  Please Note:  You might notice some irritation and congestion in your nose or some drainage.  This is from the oxygen used during your procedure.  There is no need for concern and it should clear up in a day or so.  SYMPTOMS TO REPORT IMMEDIATELY:  Following lower endoscopy (colonoscopy):  Excessive amounts of blood in the stool  Significant tenderness or worsening of abdominal pains  Swelling of the abdomen that is new, acute  Fever of 100F or higher  For urgent or  emergent issues, a gastroenterologist can be reached at any hour by calling [phone]. Do not use MyChart messaging for urgent concerns.    DIET:  We do recommend a small meal at first, but then you may proceed to your regular diet.  Drink plenty of fluids but you should avoid alcoholic beverages for 24 hours.  ACTIVITY:  You should plan to take it easy for the rest of today and you should NOT DRIVE or use heavy machinery until tomorrow (because of the sedation medicines used during the test).    FOLLOW UP: Our staff will call the number listed on your records the next business day following your procedure.  We will call around 7:15- 8:00 am to check on you and address any questions or concerns that you may have regarding the information given to you following your procedure. If we do not reach you, we will leave a message.     SIGNATURES/CONFIDENTIALITY: You and/or your care partner have signed paperwork which will be entered into your electronic medical record.  These signatures attest to the fact that that the information above on your After Visit Summary has been reviewed and is understood.  Full responsibility of the confidentiality of this discharge information lies with you and/or your care-partner.

## 2022-10-29 ENCOUNTER — Telehealth: Payer: Self-pay

## 2022-10-29 NOTE — Telephone Encounter (Signed)
  Follow up Call-     10/26/2022    8:31 AM  Call back number  Post procedure Call Back phone  # [phone]  Permission to leave phone message Yes     Patient questions:  Do you have a fever, pain , or abdominal swelling? No. Pain Score  0 *  Have you tolerated food without any problems? Yes.    Have you been able to return to your normal activities? Yes.    Do you have any questions about your discharge instructions: Diet   No. Medications  No. Follow up visit  No.  Do you have questions or concerns about your Care? No.  Actions: * If pain score is 4 or above: No action needed, pain <4.

## 2022-11-21 ENCOUNTER — Ambulatory Visit: Payer: Medicare PPO | Admitting: Cardiology

## 2022-11-27 ENCOUNTER — Ambulatory Visit: Payer: Medicare PPO | Attending: Cardiology | Admitting: Internal Medicine

## 2022-11-27 VITALS — BP 152/74 | HR 67 | Ht 71.0 in | Wt 232.0 lb

## 2022-11-27 DIAGNOSIS — R079 Chest pain, unspecified: Secondary | ICD-10-CM

## 2022-11-27 MED ORDER — METOPROLOL TARTRATE 100 MG PO TABS: 100.0000 mg | ORAL_TABLET | ORAL | 0 refills | Status: DC

## 2022-11-27 NOTE — Progress Notes (Signed)
  Cardiology Office Note:  .   Date:  11/27/2022  ID:  Robert [name], DOB 1950/12/28, MRN [id number] PCP: Tresa Garter, MD  [facility] Providers Cardiologist:  None    History of Present Illness: .   Robert [name] is a 72 y.o. male with history of hypertension who is being referred to cardiology by his primary care physician in early October due to chest pulling with exercise for 2 weeks in.  His blood pressure was mildly elevated.  He underwent a CAC score and it was 141, which was 70th percentile this was not in 2022. His total cholesterol and LDL are low. He notes chest pressure 1x. He denies recurrence of chest pressure.He was not very active. It lasted for an hour.  He works out a lot and denies symptoms. Mother had an MI at 77 y/o No cigarettes   ROS:  per HPI otherwise negative   Studies Reviewed: Marland Kitchen   EKG Interpretation Date/Time:  Tuesday November 27 2022 14:03:36 EST Ventricular Rate:  62 PR Interval:  154 QRS Duration:  92 QT Interval:  406 QTC Calculation: 412 R Axis:   15  Text Interpretation: Normal sinus rhythm Minimal voltage criteria for LVH, may be normal variant ( Sokolow-Lyon ) When compared with ECG of 27-Jul-2021 14:07, PREVIOUS ECG IS PRESENT Confirmed by Carolan Clines ([id number]) on 11/27/2022 2:11:44 PM     Risk Assessment/Calculations:     Physical Exam:   VS:   Vitals:   11/27/22 1358  BP: (!) 152/74  Pulse: 67  SpO2: 97%     Wt Readings from Last 3 Encounters:  11/27/22 232 lb (105.2 kg)  10/26/22 226 lb (102.5 kg)  10/05/22 226 lb (102.5 kg)    GEN: Well nourished, well developed in no acute distress NECK: No JVD; No carotid bruits CARDIAC: RRR, no murmurs, rubs, gallops RESPIRATORY:  Clear to auscultation without rales, wheezing or rhonchi  ABDOMEN: Soft, non-tender, non-distended EXTREMITIES:  No edema; No deformity   ASSESSMENT AND PLAN: .   Possible cardiac chest pain He had a one-time episode of chest pressure however did  last for 1 hour. His risk factors include age. Considering this we will plan for a coronary CTA with morphology.         Dispo: Follow-up as needed pending results  Signed, Yee Joss, Alben Spittle, MD

## 2022-11-27 NOTE — Patient Instructions (Addendum)
Medication Instructions:  No changes  *If you need a refill on your cardiac medications before your next appointment, please call your pharmacy*   Lab Work: BMP to check kidney function If you have labs (blood work) drawn today and your tests are completely normal, you will receive your results only by: MyChart Message (if you have MyChart) OR A paper copy in the mail If you have any lab test that is abnormal or we need to change your treatment, we will call you to review the results.   Testing/Procedures:   Your cardiac CT will be scheduled at one of the below locations:   [facility] [address] [place], [place] [address] [phone]   If scheduled at [facility], please arrive at the [facility] and [facility] Entrance (Entrance C2) of [facility] 30 minutes prior to test start time. You can use the FREE valet parking offered at entrance C (encouraged to control the heart rate for the test)  Proceed to the [facility] Radiology Department (first floor) to check-in and test prep.  All radiology patients and guests should use entrance C2 at [facility], accessed from [facility], even though the hospital's physical address listed is [address]     Please follow these instructions carefully (unless otherwise directed):  An IV will be required for this test and Nitroglycerin will be given.  Hold all erectile dysfunction medications at least 3 days (72 hrs) prior to test. (Ie viagra, cialis, sildenafil, tadalafil, etc)   On the Night Before the Test: Be sure to Drink plenty of water. Do not consume any caffeinated/decaffeinated beverages or chocolate 12 hours prior to your test. Do not take any antihistamines 12 hours prior to your test.  On the Day of the Test: Drink plenty of water until 1 hour prior to the test. Do not eat any food 1 hour prior to test. You may take your regular medications prior to the test.   Take metoprolol Tartrate 100 mg  two hours prior to test. If you take Furosemide/Hydrochlorothiazide/Spironolactone, please HOLD on the morning of the test.        After the Test: Drink plenty of water. After receiving IV contrast, you may experience a mild flushed feeling. This is normal. On occasion, you may experience a mild rash up to 24 hours after the test. This is not dangerous. If this occurs, you can take Benadryl 25 mg and increase your fluid intake. If you experience trouble breathing, this can be serious. If it is severe call 911 IMMEDIATELY. If it is mild, please call our office. If you take any of these medications: Glipizide/Metformin, Avandament, Glucavance, please do not take 48 hours after completing test unless otherwise instructed.  We will call to schedule your test 2-4 weeks out understanding that some insurance companies will need an authorization prior to the service being performed.   For more information and frequently asked questions, please visit our website : [email or web address]  For non-scheduling related questions, please contact the cardiac imaging nurse navigator should you have any questions/concerns: Cardiac Imaging Nurse Navigators Direct Office Dial: [phone]   For scheduling needs, including cancellations and rescheduling, please call [place], [phone].    Follow-Up: At [facility], you and your health needs are our priority.  As part of our continuing mission to provide you with exceptional heart care, we have created designated Provider Care Teams.  These Care Teams include your primary Cardiologist (physician) and Advanced  Practice Providers (APPs -  Physician Assistants and Nurse Practitioners) who all work together to provide you with the care you need, when you need it.   Your next appointment:   Follow up as needed depending on test results  Provider:   Maisie Fus, MD

## 2022-11-28 LAB — BASIC METABOLIC PANEL
BUN/Creatinine Ratio: 11 (ref 10–24)
BUN: 14 mg/dL (ref 8–27)
CO2: 23 mmol/L (ref 20–29)
Calcium: 9.8 mg/dL (ref 8.6–10.2)
Chloride: 101 mmol/L (ref 96–106)
Creatinine, Ser: 1.31 mg/dL — ABNORMAL HIGH (ref 0.76–1.27)
Glucose: 105 mg/dL — ABNORMAL HIGH (ref 70–99)
Potassium: 3.7 mmol/L (ref 3.5–5.2)
Sodium: 140 mmol/L (ref 134–144)
eGFR: 58 mL/min/{1.73_m2} — ABNORMAL LOW (ref >59)

## 2022-12-10 ENCOUNTER — Other Ambulatory Visit: Payer: Self-pay

## 2022-12-10 DIAGNOSIS — R079 Chest pain, unspecified: Secondary | ICD-10-CM

## 2022-12-10 NOTE — Addendum Note (Signed)
Addended byCarolan Clines on: 12/10/2022 03:18 PM   Modules accepted: Orders

## 2022-12-17 ENCOUNTER — Ambulatory Visit (HOSPITAL_COMMUNITY): Payer: Medicare PPO

## 2023-01-05 ENCOUNTER — Other Ambulatory Visit: Payer: Self-pay | Admitting: Internal Medicine

## 2023-01-05 DIAGNOSIS — R079 Chest pain, unspecified: Secondary | ICD-10-CM

## 2023-01-07 ENCOUNTER — Encounter (HOSPITAL_COMMUNITY): Payer: Medicare PPO

## 2023-01-08 ENCOUNTER — Ambulatory Visit: Payer: Medicare PPO | Admitting: Internal Medicine

## 2023-01-15 ENCOUNTER — Encounter: Payer: Self-pay | Admitting: Internal Medicine

## 2023-01-15 ENCOUNTER — Ambulatory Visit (INDEPENDENT_AMBULATORY_CARE_PROVIDER_SITE_OTHER): Payer: Medicare PPO | Admitting: Internal Medicine

## 2023-01-15 VITALS — BP 118/88 | HR 61 | Temp 97.6°F | Ht 71.0 in | Wt 218.0 lb

## 2023-01-15 DIAGNOSIS — Z789 Other specified health status: Secondary | ICD-10-CM | POA: Insufficient documentation

## 2023-01-15 DIAGNOSIS — M2550 Pain in unspecified joint: Secondary | ICD-10-CM | POA: Insufficient documentation

## 2023-01-15 DIAGNOSIS — F419 Anxiety disorder, unspecified: Secondary | ICD-10-CM

## 2023-01-15 DIAGNOSIS — N1831 Chronic kidney disease, stage 3a: Secondary | ICD-10-CM | POA: Diagnosis not present

## 2023-01-15 DIAGNOSIS — E785 Hyperlipidemia, unspecified: Secondary | ICD-10-CM

## 2023-01-15 DIAGNOSIS — I10 Essential (primary) hypertension: Secondary | ICD-10-CM

## 2023-01-15 MED ORDER — OMEPRAZOLE 20 MG PO CPDR: 20.0000 mg | DELAYED_RELEASE_CAPSULE | Freq: Two times a day (BID) | ORAL | 2 refills | Status: DC

## 2023-01-15 MED ORDER — ALPRAZOLAM 0.5 MG PO TABS: 0.5000 mg | ORAL_TABLET | Freq: Two times a day (BID) | ORAL | 3 refills | Status: AC | PRN

## 2023-01-15 MED ORDER — SILDENAFIL CITRATE 100 MG PO TABS: 50.0000 mg | ORAL_TABLET | Freq: Every day | ORAL | 5 refills | Status: DC | PRN

## 2023-01-15 MED ORDER — OLMESARTAN-AMLODIPINE-HCTZ 40-5-12.5 MG PO TABS: 1.0000 | ORAL_TABLET | Freq: Every day | ORAL | 3 refills | Status: DC

## 2023-01-15 MED ORDER — METOPROLOL SUCCINATE ER 100 MG PO TB24: 100.0000 mg | ORAL_TABLET | Freq: Every day | ORAL | 3 refills | Status: AC

## 2023-01-15 MED ORDER — ICOSAPENT ETHYL 1 G PO CAPS: 2.0000 g | ORAL_CAPSULE | Freq: Two times a day (BID) | ORAL | 3 refills | Status: DC

## 2023-01-15 NOTE — Assessment & Plan Note (Addendum)
 Hydrate well Start Vascepa

## 2023-01-15 NOTE — Progress Notes (Signed)
 Subjective:  Patient ID: Robert [name], male    DOB: [birth year]  Age: 73 y.o. MRN: [id number]  CC: No chief complaint on file.   HPI Eyden Hamil presents for anxiety - needs Alprazolam  refill C/o arthralgias on Lopid  - pain 6/10 F/u on HTN  Outpatient Medications Prior to Visit  Medication Sig Dispense Refill   Ascorbic Acid (VITAMIN C) 100 MG tablet Take 100 mg by mouth daily.     aspirin 81 MG tablet Take 81 mg by mouth daily.     Cholecalciferol (VITAMIN D3) 2000 units capsule Take 1 capsule (2,000 Units total) by mouth daily. 100 capsule 3   Ferrous Sulfate (IRON PO) Take 1 tablet by mouth daily.     GINSENG PO Take 3 tablets by mouth daily.     levocetirizine (XYZAL ) 5 MG tablet TAKE 1 TABLET(5 MG) BY MOUTH EVERY EVENING 90 tablet 3   methocarbamol  (ROBAXIN ) 500 MG tablet Take 1 tablet (500 mg total) by mouth every 8 (eight) hours as needed for muscle spasms. 20 tablet 0   tamsulosin  (FLOMAX ) 0.4 MG CAPS capsule TAKE 1 CAPSULE BY MOUTH EVERY DAY AFTER SUPPER 90 capsule 3   tobramycin (TOBREX) 0.3 % ophthalmic solution SMARTSIG:In Eye(s)     ALPRAZolam  (XANAX ) 0.5 MG tablet Take 1 tablet (0.5 mg total) by mouth 2 (two) times daily as needed for anxiety. 180 tablet 1   KRILL OIL PO Take 1 capsule by mouth daily.     metoprolol  succinate (TOPROL -XL) 100 MG 24 hr tablet TAKE 1 TABLET(100 MG) BY MOUTH DAILY WITH OR IMMEDIATELY FOLLOWING A MEAL 90 tablet 3   Olmesartan -amLODIPine -HCTZ 40-5-12.5 MG TABS TAKE 1 TABLET BY MOUTH EVERY DAY(ANNUAL APPOINTMENT IS DUE, MUST SEE PROVIDER FOR FUTURE REFILLS) 90 tablet 3   omeprazole  (PRILOSEC) 20 MG capsule TAKE 1 CAPSULE BY MOUTH TWICE DAILY BEFORE A MEAL. 180 capsule 2   pravastatin  (PRAVACHOL ) 20 MG tablet TAKE 1 TABLET(20 MG) BY MOUTH DAILY 90 tablet 3   metoprolol  tartrate (LOPRESSOR ) 100 MG tablet Take 1 tablet (100 mg total) by mouth 1 day or 1 dose for 1 dose. Take 2 hours before CT 1 tablet 0   gemfibrozil  (LOPID ) 600 MG tablet TAKE 1  TABLET BY MOUTH TWICE DAILY (Patient not taking: Reported on 01/15/2023) 180 tablet 1   sildenafil  (VIAGRA ) 100 MG tablet Take 0.5-1 tablets (50-100 mg total) by mouth daily as needed for erectile dysfunction. (Patient not taking: Reported on 01/15/2023) 20 tablet 5   No facility-administered medications prior to visit.    ROS: Review of Systems  Constitutional:  Negative for appetite change, fatigue and unexpected weight change.  HENT:  Negative for congestion, nosebleeds, sneezing, sore throat and trouble swallowing.   Eyes:  Negative for itching and visual disturbance.  Respiratory:  Negative for cough.   Cardiovascular:  Negative for chest pain, palpitations and leg swelling.  Gastrointestinal:  Negative for abdominal distention, blood in stool, diarrhea and nausea.  Genitourinary:  Negative for frequency and hematuria.  Musculoskeletal:  Positive for arthralgias. Negative for back pain, gait problem, joint swelling and neck pain.  Skin:  Negative for rash.  Neurological:  Negative for dizziness, tremors, speech difficulty and weakness.  Psychiatric/Behavioral:  Negative for agitation, dysphoric mood and sleep disturbance. The patient is not nervous/anxious.     Objective:  BP 118/88 (BP Location: Left Arm, Patient Position: Sitting, Cuff Size: Normal)   Pulse 61   Temp 97.6 F (36.4 C) (Oral)   Ht 5' 11 (  1.803 m)   Wt 218 lb (98.9 kg)   SpO2 96%   BMI 30.40 kg/m   BP Readings from Last 3 Encounters:  01/15/23 118/88  11/27/22 (!) 152/74  10/26/22 125/79    Wt Readings from Last 3 Encounters:  01/15/23 218 lb (98.9 kg)  11/27/22 232 lb (105.2 kg)  10/26/22 226 lb (102.5 kg)    Physical Exam Constitutional:      General: He is not in acute distress.    Appearance: He is well-developed.     Comments: NAD  Eyes:     Conjunctiva/sclera: Conjunctivae normal.     Pupils: Pupils are equal, round, and reactive to light.  Neck:     Thyroid : No thyromegaly.      Vascular: No JVD.  Cardiovascular:     Rate and Rhythm: Normal rate and regular rhythm.     Heart sounds: Normal heart sounds. No murmur heard.    No friction rub. No gallop.  Pulmonary:     Effort: Pulmonary effort is normal. No respiratory distress.     Breath sounds: Normal breath sounds. No wheezing or rales.  Chest:     Chest wall: No tenderness.  Abdominal:     General: Bowel sounds are normal. There is no distension.     Palpations: Abdomen is soft. There is no mass.     Tenderness: There is no abdominal tenderness. There is no guarding or rebound.  Musculoskeletal:        General: No tenderness. Normal range of motion.     Cervical back: Normal range of motion.  Lymphadenopathy:     Cervical: No cervical adenopathy.  Skin:    General: Skin is warm and dry.     Findings: No rash.  Neurological:     Mental Status: He is alert and oriented to person, place, and time.     Cranial Nerves: No cranial nerve deficit.     Motor: No abnormal muscle tone.     Coordination: Coordination normal.     Gait: Gait normal.     Deep Tendon Reflexes: Reflexes are normal and symmetric.  Psychiatric:        Behavior: Behavior normal.        Thought Content: Thought content normal.        Judgment: Judgment normal.     Lab Results  Component Value Date   WBC 3.6 (L) 08/31/2022   HGB 12.9 (L) 08/31/2022   HCT 39.0 08/31/2022   PLT 222.0 08/31/2022   GLUCOSE 105 (H) 11/27/2022   CHOL 124 06/25/2022   TRIG 108.0 06/25/2022   HDL 48.10 06/25/2022   LDLDIRECT 47.0 10/12/2020   LDLCALC 54 06/25/2022   ALT 28 08/31/2022   AST 39 (H) 08/31/2022   NA 140 11/27/2022   K 3.7 11/27/2022   CL 101 11/27/2022   CREATININE 1.31 (H) 11/27/2022   BUN 14 11/27/2022   CO2 23 11/27/2022   TSH 2.16 06/25/2022   PSA 1.82 06/25/2022   HGBA1C 6.3 04/10/2021    CT ANGIO HEAD NECK W WO CM Result Date: 07/27/2021 CLINICAL DATA:  Neck trauma, impaired range of motion. EXAM: CT ANGIOGRAPHY HEAD AND  NECK TECHNIQUE: Multidetector CT imaging of the head and neck was performed using the standard protocol during bolus administration of intravenous contrast. Multiplanar CT image reconstructions and MIPs were obtained to evaluate the vascular anatomy. Carotid stenosis measurements (when applicable) are obtained utilizing NASCET criteria, using the distal internal carotid diameter as the denominator.  RADIATION DOSE REDUCTION: This exam was performed according to the departmental dose-optimization program which includes automated exposure control, adjustment of the mA and/or kV according to patient size and/or use of iterative reconstruction technique. CONTRAST:  75mL OMNIPAQUE  IOHEXOL  350 MG/ML SOLN COMPARISON:  Head CT April 26, 2003. FINDINGS: CT HEAD FINDINGS Brain: No evidence of acute infarction, hemorrhage, hydrocephalus, extra-axial collection or mass lesion/mass effect. Vascular: No hyperdense vessel or unexpected calcification. Skull: Normal. Negative for fracture or focal lesion. Sinuses: Mucosal thickening with bubbly secretion within the bilateral ethmoid cells and frontal sinuses. Orbits: No acute finding. Review of the MIP images confirms the above findings CTA NECK FINDINGS Aortic arch: Standard branching. Imaged portion shows no evidence of aneurysm or dissection. No significant stenosis of the major arch vessel origins. Right carotid system: Mild atherosclerotic changes in the right carotid bifurcation without hemodynamically significant stenosis. Left carotid system: Mild atherosclerotic changes in the left carotid bifurcation without hemodynamically significant stenosis. Vertebral arteries: Evaluation of the V1 segment of the left vertebral artery is precluded by artifact from dense contrast in adjacent vein. The most distal portion of the left A1 segment and the remainder of the left vertebral artery showed no evidence of occlusion or stenosis. The right vertebral artery has normal course and  caliber throughout its entire course. The Skeleton: No acute or aggressive process identified. Other neck: Negative. Upper chest: Negative Review of the MIP images confirms the above findings CTA HEAD FINDINGS Anterior circulation: No significant stenosis, proximal occlusion, aneurysm, or vascular malformation. Posterior circulation: No significant stenosis, proximal occlusion, aneurysm, or vascular malformation. Venous sinuses: As permitted by contrast timing, patent. Anatomic variants: None significant. Review of the MIP images confirms the above findings IMPRESSION: 1. No acute intracranial abnormality. 2. No intracranial large vessel occlusion or hemodynamically significant stenosis. 3. Mild atherosclerotic changes of the bilateral carotid bifurcations without hemodynamically significant stenosis. 4. No evidence of traumatic vascular injury to the major neck arteries. Electronically Signed   By: Katyucia  de Macedo Rodrigues M.D.   On: 07/27/2021 15:45    Assessment & Plan:   Problem List Items Addressed This Visit     Dyslipidemia   D/c Lopid  Start Vascepa       Relevant Medications   icosapent  Ethyl (VASCEPA ) 1 g capsule   Other Relevant Orders   Lipid panel   Comprehensive metabolic panel   Anxiety disorder   Alprazolam  prn  Potential benefits of a long term benzodiazepines  use as well as potential risks  and complications were explained to the patient and were aknowledged.      Relevant Medications   ALPRAZolam  (XANAX ) 0.5 MG tablet   Essential hypertension   BP Readings from Last 3 Encounters:  01/15/23 118/88  11/27/22 (!) 152/74  10/26/22 125/79         Relevant Medications   Olmesartan -amLODIPine -HCTZ 40-5-12.5 MG TABS   metoprolol  succinate (TOPROL -XL) 100 MG 24 hr tablet   icosapent  Ethyl (VASCEPA ) 1 g capsule   sildenafil  (VIAGRA ) 100 MG tablet   Chronic renal impairment, stage 3a (HCC) - Primary   Hydrate well Start Vascepa       Arthralgia   D/c Lopid        Statin intolerance   D/c Lopid          Meds ordered this encounter  Medications   ALPRAZolam  (XANAX ) 0.5 MG tablet    Sig: Take 1 tablet (0.5 mg total) by mouth 2 (two) times daily as needed for anxiety.    Dispense:  60  tablet    Refill:  3   omeprazole  (PRILOSEC) 20 MG capsule    Sig: Take 1 capsule (20 mg total) by mouth 2 (two) times daily before a meal.    Dispense:  180 capsule    Refill:  2   Olmesartan -amLODIPine -HCTZ 40-5-12.5 MG TABS    Sig: Take 1 tablet by mouth daily.    Dispense:  90 tablet    Refill:  3   metoprolol  succinate (TOPROL -XL) 100 MG 24 hr tablet    Sig: Take 1 tablet (100 mg total) by mouth daily. Take with or immediately following a meal.    Dispense:  90 tablet    Refill:  3   icosapent  Ethyl (VASCEPA ) 1 g capsule    Sig: Take 2 capsules (2 g total) by mouth 2 (two) times daily.    Dispense:  360 capsule    Refill:  3   sildenafil  (VIAGRA ) 100 MG tablet    Sig: Take 0.5-1 tablets (50-100 mg total) by mouth daily as needed for erectile dysfunction.    Dispense:  20 tablet    Refill:  5      Follow-up: Return in about 3 months (around 04/15/2023) for a follow-up visit.  Marolyn Noel, MD

## 2023-01-15 NOTE — Assessment & Plan Note (Addendum)
 D/c Lopid Start Vascepa

## 2023-01-15 NOTE — Assessment & Plan Note (Signed)
 D/c Lopid

## 2023-01-15 NOTE — Assessment & Plan Note (Signed)
Alprazolam prn  Potential benefits of a long term benzodiazepines  use as well as potential risks  and complications were explained to the patient and were aknowledged. 

## 2023-01-15 NOTE — Assessment & Plan Note (Signed)
 BP Readings from Last 3 Encounters:  01/15/23 118/88  11/27/22 (!) 152/74  10/26/22 125/79

## 2023-01-17 DIAGNOSIS — G4733 Obstructive sleep apnea (adult) (pediatric): Secondary | ICD-10-CM | POA: Diagnosis not present

## 2023-02-22 ENCOUNTER — Encounter (HOSPITAL_COMMUNITY): Payer: Self-pay

## 2023-02-22 ENCOUNTER — Encounter: Payer: Self-pay | Admitting: Internal Medicine

## 2023-03-07 ENCOUNTER — Ambulatory Visit (HOSPITAL_COMMUNITY): Payer: Medicare PPO | Attending: Internal Medicine

## 2023-03-07 DIAGNOSIS — R079 Chest pain, unspecified: Secondary | ICD-10-CM | POA: Diagnosis not present

## 2023-03-07 LAB — MYOCARDIAL PERFUSION IMAGING
Angina Index: 0
Duke Treadmill Score: 7
Estimated workload: 7.7
Exercise duration (min): 6 min
Exercise duration (sec): 31 s
LV dias vol: 103 mL (ref 62–150)
LV sys vol: 45 mL
MPHR: 147 {beats}/min
Nuc Stress EF: 56 %
Peak HR: 142 {beats}/min
Percent HR: 96 %
Rest HR: 67 {beats}/min
Rest Nuclear Isotope Dose: 11 mCi
SDS: 0
SRS: 0
SSS: 0
ST Depression (mm): 0 mm
Stress Nuclear Isotope Dose: 29.9 mCi
TID: 0.94

## 2023-03-07 MED ORDER — TECHNETIUM TC 99M TETROFOSMIN IV KIT
29.9000 | PACK | Freq: Once | INTRAVENOUS | Status: AC | PRN
  Administered 2023-03-07: 29.9 via INTRAVENOUS

## 2023-03-07 MED ORDER — TECHNETIUM TC 99M TETROFOSMIN IV KIT
11.0000 | PACK | Freq: Once | INTRAVENOUS | Status: AC | PRN
  Administered 2023-03-07: 11 via INTRAVENOUS

## 2023-03-07 MED ORDER — TECHNETIUM TC 99M TETROFOSMIN IV KIT: 11.0000 | PACK | Freq: Once | INTRAVENOUS | Status: DC | PRN

## 2023-04-03 ENCOUNTER — Ambulatory Visit (INDEPENDENT_AMBULATORY_CARE_PROVIDER_SITE_OTHER): Payer: Medicare PPO

## 2023-04-03 VITALS — Ht 71.0 in | Wt 227.0 lb

## 2023-04-03 DIAGNOSIS — Z0000 Encounter for general adult medical examination without abnormal findings: Secondary | ICD-10-CM

## 2023-04-03 NOTE — Patient Instructions (Signed)
 Robert [name] , Thank you for taking time to come for your Medicare Wellness Visit. I appreciate your ongoing commitment to your health goals. Please review the following plan we discussed and let me know if I can assist you in the future.   Referrals/Orders/Follow-Ups/Clinician Recommendations: Aim for 30 minutes of exercise or brisk walking, 6-8 glasses of water, and 5 servings of fruits and vegetables each day.   This is a list of the screening recommended for you and due dates:  Health Maintenance  Topic Date Due   COVID-19 Vaccine (7 - 2024-25 season) 09/02/2022   Flu Shot  08/02/2023   Medicare Annual Wellness Visit  04/02/2024   DTaP/Tdap/Td vaccine (2 - Td or Tdap) 05/18/2024   Colon Cancer Screening  10/26/2027   Pneumonia Vaccine  Completed   Hepatitis C Screening  Completed   HPV Vaccine  Aged Out   Zoster (Shingles) Vaccine  Discontinued    Advanced directives: (Provided) Advance directive discussed with you today. I have provided a copy for you to complete at home and have notarized. Once this is complete, please bring a copy in to our office so we can scan it into your chart.   Next Medicare Annual Wellness Visit scheduled for next year: Yes

## 2023-04-03 NOTE — Progress Notes (Addendum)
 Subjective:   Robert [name] is a 73 y.o. who presents for a Medicare Wellness preventive visit.  Visit Complete: Virtual I connected with  Robert [name] on 04/03/23 by a audio enabled telemedicine application and verified that I am speaking with the correct person using two identifiers.  Patient Location: Home  Provider Location: Office/Clinic  I discussed the limitations of evaluation and management by telemedicine. The patient expressed understanding and agreed to proceed.  Vital Signs: Because this visit was a virtual/telehealth visit, some criteria may be missing or patient reported. Any vitals not documented were not able to be obtained and vitals that have been documented are patient reported.  VideoDeclined- This patient declined Librarian, academic. Therefore the visit was completed with audio only.  Persons Participating in Visit: Patient.  AWV Questionnaire: No: Patient Medicare AWV questionnaire was not completed prior to this visit.  Cardiac Risk Factors include: advanced age (>74men, >79 women);hypertension;dyslipidemia;male gender;obesity (BMI >30kg/m2)     Objective:    Today's Vitals   04/03/23 1458  Weight: 227 lb (103 kg)  Height: 5\' 11"  (1.803 m)   Body mass index is 31.66 kg/m.     04/03/2023    2:56 PM 04/02/2022    4:08 PM 03/31/2021    2:08 PM 03/22/2016    1:37 PM 03/12/2016    8:27 AM  Advanced Directives  Does Patient Have a Medical Advance Directive? No No No No No  Would patient like information on creating a medical advance directive? Yes (MAU/Ambulatory/Procedural Areas - Information given) No - Patient declined No - Patient declined No - Patient declined No - Patient declined    Current Medications (verified) Outpatient Encounter Medications as of 04/03/2023  Medication Sig   ALPRAZolam (XANAX) 0.5 MG tablet Take 1 tablet (0.5 mg total) by mouth 2 (two) times daily as needed for anxiety.   Ascorbic Acid (VITAMIN C) 100  MG tablet Take 100 mg by mouth daily.   aspirin 81 MG tablet Take 81 mg by mouth daily.   Cholecalciferol (VITAMIN D3) 2000 units capsule Take 1 capsule (2,000 Units total) by mouth daily.   Ferrous Sulfate (IRON PO) Take 1 tablet by mouth daily.   GINSENG PO Take 3 tablets by mouth daily.   icosapent Ethyl (VASCEPA) 1 g capsule Take 2 capsules (2 g total) by mouth 2 (two) times daily.   levocetirizine (XYZAL) 5 MG tablet TAKE 1 TABLET(5 MG) BY MOUTH EVERY EVENING   methocarbamol (ROBAXIN) 500 MG tablet Take 1 tablet (500 mg total) by mouth every 8 (eight) hours as needed for muscle spasms.   metoprolol succinate (TOPROL-XL) 100 MG 24 hr tablet Take 1 tablet (100 mg total) by mouth daily. Take with or immediately following a meal.   Olmesartan-amLODIPine-HCTZ 40-5-12.5 MG TABS Take 1 tablet by mouth daily.   omeprazole (PRILOSEC) 20 MG capsule Take 1 capsule (20 mg total) by mouth 2 (two) times daily before a meal.   sildenafil (VIAGRA) 100 MG tablet Take 0.5-1 tablets (50-100 mg total) by mouth daily as needed for erectile dysfunction.   tamsulosin (FLOMAX) 0.4 MG CAPS capsule TAKE 1 CAPSULE BY MOUTH EVERY DAY AFTER SUPPER   tobramycin (TOBREX) 0.3 % ophthalmic solution SMARTSIG:In Eye(s)   [DISCONTINUED] metoprolol tartrate (LOPRESSOR) 100 MG tablet Take 1 tablet (100 mg total) by mouth 1 day or 1 dose for 1 dose. Take 2 hours before CT   No facility-administered encounter medications on file as of 04/03/2023.    Allergies (verified)  Atorvastatin, Lopid [gemfibrozil], and Statins   History: Past Medical History:  Diagnosis Date   Allergy    Anemia    on meds   Anxiety    ED (erectile dysfunction)    GERD (gastroesophageal reflux disease)    on meds   History of gastritis    History of UTI    Hx of colonic polyps    Hyperlipidemia    on meds   Hypertension    on meds   IBS (irritable bowel syndrome)    Myalgia    Sleep apnea    uses CPAP   Venous insufficiency    Past  Surgical History:  Procedure Laterality Date   APPENDECTOMY     COLON SURGERY  07/2009   Dr. Neita Carp tubulovillous adenoma   COLONOSCOPY  2019   CG-MAC-miralax(exc)-TA   POLYPECTOMY  2019   TA   VASECTOMY     Family History  Problem Relation Age of Onset   Colon cancer Cousin    Colon polyps Neg Hx    Pancreatic cancer Neg Hx    Rectal cancer Neg Hx    Stomach cancer Neg Hx    Esophageal cancer Neg Hx    Liver cancer Neg Hx    Prostate cancer Neg Hx    Social History   Socioeconomic History   Marital status: Married    Spouse name: Not on file   Number of children: Not on file   Years of education: 16   Highest education level: Bachelor's degree (e.g., BA, AB, BS)  Occupational History   Occupation: ADM Technical sales engineer    Employer: [employer]  Tobacco Use   Smoking status: Former    Types: Cigars    Quit date: 04/02/2022    Years since quitting: 1.0    Passive exposure: Past   Smokeless tobacco: Never   Tobacco comments:    smokes one cigar once a month-01/27/20    04/02/2022 patient stated he quit smoking cigars-S.Reymundo Poll, LPN  Vaping Use   Vaping status: Never Used  Substance and Sexual Activity   Alcohol use: Yes    Alcohol/week: 1.0 standard drink of alcohol    Types: 1 Shots of liquor per week    Comment: social use, very occasional   Drug use: No   Sexual activity: Yes  Other Topics Concern   Not on file  Social History Narrative   Not on file   Social Drivers of Health   Financial Resource Strain: Low Risk  (04/03/2023)   Overall Financial Resource Strain (CARDIA)    Difficulty of Paying Living Expenses: Not hard at all  Food Insecurity: No Food Insecurity (04/03/2023)   Hunger Vital Sign    Worried About Running Out of Food in the Last Year: Never true    Ran Out of Food in the Last Year: Never true  Transportation Needs: No Transportation Needs (04/03/2023)   PRAPARE - Administrator, Civil Service (Medical): No    Lack of  Transportation (Non-Medical): No  Physical Activity: Sufficiently Active (04/03/2023)   Exercise Vital Sign    Days of Exercise per Week: 2 days    Minutes of Exercise per Session: 150+ min  Stress: No Stress Concern Present (04/03/2023)   [employer] of Occupational Health - Occupational Stress Questionnaire    Feeling of Stress : Not at all  Social Connections: Socially Integrated (04/03/2023)   Social Connection and Isolation Panel [NHANES]    Frequency of Communication with Friends  and Family: More than three times a week    Frequency of Social Gatherings with Friends and Family: Once a week    Attends Religious Services: More than 4 times per year    Active Member of [employer] or Organizations: Yes    Attends Banker Meetings: 1 to 4 times per year    Marital Status: Married    Tobacco Counseling Counseling given: No Tobacco comments: smokes one cigar once a month-01/27/20 04/02/2022 patient stated he quit smoking cigars-S.Hatfield, LPN    Clinical Intake:  Pre-visit preparation completed: Yes  Pain : No/denies pain     BMI - recorded: 31.66 Nutritional Status: BMI <19  Underweight Nutritional Risks: None Diabetes: No  Lab Results  Component Value Date   HGBA1C 6.3 04/10/2021   HGBA1C 6.3 10/12/2020   HGBA1C 6.5 03/26/2018     How often do you need to have someone help you when you read instructions, pamphlets, or other written materials from your doctor or pharmacy?: 1 - Never  Interpreter Needed?: No  Information entered by :: Hassell Halim, CMA   Activities of Daily Living     04/03/2023    3:00 PM  In your present state of health, do you have any difficulty performing the following activities:  Hearing? 0  Vision? 0  Difficulty concentrating or making decisions? 0  Walking or climbing stairs? 0  Dressing or bathing? 0  Doing errands, shopping? 0  Preparing Food and eating ? N  Using the Toilet? N  In the past six months, have you  accidently leaked urine? N  Do you have problems with loss of bowel control? N  Managing your Medications? N  Managing your Finances? N  Housekeeping or managing your Housekeeping? N    Patient Care Team: Plotnikov, Georgina Quint, MD as PCP - General (Internal Medicine) Maisie Fus, MD as PCP - Cardiology (Cardiology) Ander Purpura, OD as Consulting Physician (Optometry)  Indicate any recent Medical Services you may have received from other than [facility] providers in the past year (date may be approximate).     Assessment:   This is a routine wellness examination for Kalvyn.  Hearing/Vision screen Hearing Screening - Comments:: Denies hearing difficulties   Vision Screening - Comments:: Wears rx glasses - up to date with routine eye exams with Dr Ander Purpura   Goals Addressed               This Visit's Progress     Weight (lb) < 200 lb (90.7 kg) (pt-stated)   227 lb (103 kg)     Patient stated that he's wanting to lose weight and manage labs       Depression Screen     04/03/2023    3:04 PM 01/15/2023    8:03 AM 10/02/2022    8:17 AM 06/25/2022    9:03 AM 04/02/2022    4:13 PM 03/31/2021    2:10 PM 05/09/2020   11:16 AM  PHQ 2/9 Scores  PHQ - 2 Score 0 0 0 0 0 0 0  PHQ- 9 Score 1    0  2    Fall Risk     04/03/2023    3:07 PM 01/15/2023    8:03 AM 10/02/2022    8:17 AM 06/25/2022    9:03 AM 04/02/2022    4:10 PM  Fall Risk   Falls in the past year? 0 0 0 0 0  Number falls in past yr: 0 0 0 0  0  Injury with Fall? 0 0 0 0 0  Risk for fall due to : No Fall Risks No Fall Risks No Fall Risks No Fall Risks No Fall Risks  Follow up Falls prevention discussed;Falls evaluation completed Falls evaluation completed Falls evaluation completed Falls evaluation completed Falls prevention discussed    MEDICARE RISK AT HOME:  Medicare Risk at Home Any stairs in or around the home?: No If so, are there any without handrails?: No Home free of loose throw rugs in walkways, pet beds,  electrical cords, etc?: Yes Adequate lighting in your home to reduce risk of falls?: Yes Life alert?: No Use of a cane, walker or w/c?: No Grab bars in the bathroom?: Yes (in the shower) Shower chair or bench in shower?: Yes Elevated toilet seat or a handicapped toilet?: Yes  TIMED UP AND GO:  Was the test performed?  No  Cognitive Function: 6CIT completed        04/03/2023    3:08 PM 04/02/2022    4:13 PM 03/31/2021    3:21 PM  6CIT Screen  What Year? 0 points 0 points 0 points  What month? 0 points 0 points 0 points  What time? 0 points 0 points 0 points  Count back from 20 0 points 0 points 0 points  Months in reverse 2 points 0 points 0 points  Repeat phrase 0 points 0 points 0 points  Total Score 2 points 0 points 0 points    Immunizations Immunization History  Administered Date(s) Administered   Fluad Quad(high Dose 65+) 10/04/2021   Influenza Split 11/02/2010, 10/18/2011, 10/01/2012   Influenza Whole 12/05/2009   Influenza, High Dose Seasonal PF 09/28/2020   Influenza,inj,Quad PF,6+ Mos 10/15/2016   Influenza-Unspecified 10/01/2013, 10/18/2014, 10/08/2017, 10/09/2022   PFIZER(Purple Top)SARS-COV-2 Vaccination 01/23/2019, 02/13/2019, 09/27/2019, 04/25/2020   Pfizer Covid-19 Vaccine Bivalent Booster 89yrs & up 09/29/2020   Pfizer(Comirnaty)Fall Seasonal Vaccine 12 years and older 10/04/2021   Pneumococcal Conjugate-13 12/14/2003, 05/09/2020   Pneumococcal Polysaccharide-23 05/31/2015, 03/20/2017   Respiratory Syncytial Virus Vaccine,Recomb Aduvanted(Arexvy) 01/13/2022   Tdap 05/19/2014   Zoster Recombinant(Shingrix) 05/26/2019    Screening Tests Health Maintenance  Topic Date Due   COVID-19 Vaccine (7 - 2024-25 season) 09/02/2022   INFLUENZA VACCINE  08/02/2023   Medicare Annual Wellness (AWV)  04/02/2024   DTaP/Tdap/Td (2 - Td or Tdap) 05/18/2024   Colonoscopy  10/26/2027   Pneumonia Vaccine 36+ Years old  Completed   Hepatitis C Screening  Completed   HPV  VACCINES  Aged Out   Zoster Vaccines- Shingrix  Discontinued    Health Maintenance  Health Maintenance Due  Topic Date Due   COVID-19 Vaccine (7 - 2024-25 season) 09/02/2022   Health Maintenance Items Addressed:  04/03/2023   Additional Screening:  Vision Screening: Recommended annual ophthalmology exams for early detection of glaucoma and other disorders of the eye.  Dental Screening: Recommended annual dental exams for proper oral hygiene  Community Resource Referral / Chronic Care Management: CRR required this visit?  No   CCM required this visit?  No     Plan:     I have personally reviewed and noted the following in the patient's chart:   Medical and social history Use of alcohol, tobacco or illicit drugs  Current medications and supplements including opioid prescriptions. Patient is not currently taking opioid prescriptions. Functional ability and status Nutritional status Physical activity Advanced directives List of other physicians Hospitalizations, surgeries, and ER visits in previous 12 months Vitals Screenings to  include cognitive, depression, and falls Referrals and appointments  In addition, I have reviewed and discussed with patient certain preventive protocols, quality metrics, and best practice recommendations. A written personalized care plan for preventive services as well as general preventive health recommendations were provided to patient.     Darreld Mclean, CMA   04/03/2023   After Visit Summary: (MyChart) Due to this being a telephonic visit, the after visit summary with patients personalized plan was offered to patient via MyChart   Notes: Nothing significant to report at this time.  Medical screening examination/treatment/procedure(s) were performed by non-physician practitioner and as supervising physician I was immediately available for consultation/collaboration.  I agree with above. Jacinta Shoe, MD

## 2023-05-09 ENCOUNTER — Encounter: Payer: Self-pay | Admitting: Internal Medicine

## 2023-05-09 ENCOUNTER — Ambulatory Visit: Admitting: Internal Medicine

## 2023-05-09 VITALS — BP 135/80 | HR 54 | Temp 97.8°F | Ht 71.0 in | Wt 235.4 lb

## 2023-05-09 DIAGNOSIS — I10 Essential (primary) hypertension: Secondary | ICD-10-CM

## 2023-05-09 DIAGNOSIS — N1831 Chronic kidney disease, stage 3a: Secondary | ICD-10-CM

## 2023-05-09 DIAGNOSIS — E785 Hyperlipidemia, unspecified: Secondary | ICD-10-CM

## 2023-05-09 DIAGNOSIS — F419 Anxiety disorder, unspecified: Secondary | ICD-10-CM | POA: Diagnosis not present

## 2023-05-09 DIAGNOSIS — N529 Male erectile dysfunction, unspecified: Secondary | ICD-10-CM

## 2023-05-09 LAB — LIPID PANEL
Cholesterol: 296 mg/dL — ABNORMAL HIGH (ref 0–200)
HDL: 33.4 mg/dL — ABNORMAL LOW (ref >39.00)
LDL Cholesterol: 209 mg/dL — ABNORMAL HIGH (ref 0–99)
NonHDL: 262.98
Total CHOL/HDL Ratio: 9
Triglycerides: 272 mg/dL — ABNORMAL HIGH (ref 0.0–149.0)
VLDL: 54.4 mg/dL — ABNORMAL HIGH (ref 0.0–40.0)

## 2023-05-09 LAB — COMPREHENSIVE METABOLIC PANEL WITH GFR
ALT: 44 U/L (ref 0–53)
AST: 54 U/L — ABNORMAL HIGH (ref 0–37)
Albumin: 4.4 g/dL (ref 3.5–5.2)
Alkaline Phosphatase: 59 U/L (ref 39–117)
BUN: 16 mg/dL (ref 6–23)
CO2: 27 meq/L (ref 19–32)
Calcium: 9.8 mg/dL (ref 8.4–10.5)
Chloride: 100 meq/L (ref 96–112)
Creatinine, Ser: 1.42 mg/dL (ref 0.40–1.50)
GFR: 49.08 mL/min — ABNORMAL LOW (ref >60.00)
Glucose, Bld: 95 mg/dL (ref 70–99)
Potassium: 3.4 meq/L — ABNORMAL LOW (ref 3.5–5.1)
Sodium: 135 meq/L (ref 135–145)
Total Bilirubin: 0.7 mg/dL (ref 0.2–1.2)
Total Protein: 7.5 g/dL (ref 6.0–8.3)

## 2023-05-09 LAB — TSH: TSH: 2.15 u[IU]/mL (ref 0.35–5.50)

## 2023-05-09 NOTE — Assessment & Plan Note (Signed)
 BP Readings from Last 3 Encounters:  05/09/23 (!) 140/78  01/15/23 118/88  11/27/22 (!) 152/74

## 2023-05-09 NOTE — Assessment & Plan Note (Signed)
 D/c Lopid Start Vascepa

## 2023-05-09 NOTE — Patient Instructions (Signed)
 Intermittent fasting, low carb diet

## 2023-05-09 NOTE — Assessment & Plan Note (Signed)
Alprazolam prn  Potential benefits of a long term benzodiazepines  use as well as potential risks  and complications were explained to the patient and were aknowledged. 

## 2023-05-09 NOTE — Assessment & Plan Note (Signed)
 Hydrate well On Vascepa 

## 2023-05-09 NOTE — Progress Notes (Signed)
 Subjective:  Patient ID: Robert [name], male    DOB: 10/10/1950  Age: 73 y.o. MRN: [id number]  CC: Medical Management of Chronic Issues (4 Month follow up. Labs)   HPI Robert [name] presents for anxiety, dyslipidemia, ED  Outpatient Medications Prior to Visit  Medication Sig Dispense Refill   ALPRAZolam  (XANAX ) 0.5 MG tablet Take 1 tablet (0.5 mg total) by mouth 2 (two) times daily as needed for anxiety. 60 tablet 3   Ascorbic Acid (VITAMIN C) 100 MG tablet Take 100 mg by mouth daily.     aspirin 81 MG tablet Take 81 mg by mouth daily.     Cholecalciferol (VITAMIN D3) 2000 units capsule Take 1 capsule (2,000 Units total) by mouth daily. 100 capsule 3   Ferrous Sulfate (IRON PO) Take 1 tablet by mouth daily.     GINSENG PO Take 3 tablets by mouth daily.     icosapent  Ethyl (VASCEPA ) 1 g capsule Take 2 capsules (2 g total) by mouth 2 (two) times daily. 360 capsule 3   levocetirizine (XYZAL ) 5 MG tablet TAKE 1 TABLET(5 MG) BY MOUTH EVERY EVENING 90 tablet 3   metoprolol  succinate (TOPROL -XL) 100 MG 24 hr tablet Take 1 tablet (100 mg total) by mouth daily. Take with or immediately following a meal. 90 tablet 3   Olmesartan -amLODIPine -HCTZ 40-5-12.5 MG TABS Take 1 tablet by mouth daily. 90 tablet 3   omeprazole  (PRILOSEC) 20 MG capsule Take 1 capsule (20 mg total) by mouth 2 (two) times daily before a meal. 180 capsule 2   sildenafil  (VIAGRA ) 100 MG tablet Take 0.5-1 tablets (50-100 mg total) by mouth daily as needed for erectile dysfunction. 20 tablet 5   tamsulosin  (FLOMAX ) 0.4 MG CAPS capsule TAKE 1 CAPSULE BY MOUTH EVERY DAY AFTER SUPPER 90 capsule 3   tobramycin (TOBREX) 0.3 % ophthalmic solution SMARTSIG:In Eye(s)     methocarbamol  (ROBAXIN ) 500 MG tablet Take 1 tablet (500 mg total) by mouth every 8 (eight) hours as needed for muscle spasms. 20 tablet 0   No facility-administered medications prior to visit.    ROS: Review of Systems  Constitutional:  Positive for unexpected weight  change. Negative for appetite change and fatigue.  HENT:  Negative for congestion, nosebleeds, sneezing, sore throat and trouble swallowing.   Eyes:  Negative for itching and visual disturbance.  Respiratory:  Negative for cough.   Cardiovascular:  Negative for chest pain, palpitations and leg swelling.  Gastrointestinal:  Negative for abdominal distention, blood in stool, diarrhea and nausea.  Genitourinary:  Negative for frequency and hematuria.  Musculoskeletal:  Negative for back pain, gait problem, joint swelling and neck pain.  Skin:  Negative for rash.  Neurological:  Negative for dizziness, tremors, speech difficulty and weakness.  Psychiatric/Behavioral:  Negative for agitation, dysphoric mood and sleep disturbance. The patient is not nervous/anxious.     Objective:  BP 135/80   Pulse (!) 54   Temp 97.8 F (36.6 C)   Ht 5\' 11"  (1.803 m)   Wt 235 lb 6.4 oz (106.8 kg)   SpO2 97%   BMI 32.83 kg/m   BP Readings from Last 3 Encounters:  05/09/23 135/80  01/15/23 118/88  11/27/22 (!) 152/74    Wt Readings from Last 3 Encounters:  05/09/23 235 lb 6.4 oz (106.8 kg)  04/03/23 227 lb (103 kg)  03/07/23 218 lb (98.9 kg)    Physical Exam Constitutional:      General: He is not in acute distress.  Appearance: Normal appearance. He is well-developed.     Comments: NAD  Eyes:     Conjunctiva/sclera: Conjunctivae normal.     Pupils: Pupils are equal, round, and reactive to light.  Neck:     Thyroid : No thyromegaly.     Vascular: No JVD.  Cardiovascular:     Rate and Rhythm: Normal rate and regular rhythm.     Heart sounds: Normal heart sounds. No murmur heard.    No friction rub. No gallop.  Pulmonary:     Effort: Pulmonary effort is normal. No respiratory distress.     Breath sounds: Normal breath sounds. No wheezing or rales.  Chest:     Chest wall: No tenderness.  Abdominal:     General: Bowel sounds are normal. There is no distension.     Palpations: Abdomen  is soft. There is no mass.     Tenderness: There is no abdominal tenderness. There is no guarding or rebound.  Musculoskeletal:        General: No tenderness. Normal range of motion.     Cervical back: Normal range of motion.  Lymphadenopathy:     Cervical: No cervical adenopathy.  Skin:    General: Skin is warm and dry.     Findings: No rash.  Neurological:     Mental Status: He is alert and oriented to person, place, and time.     Cranial Nerves: No cranial nerve deficit.     Motor: No abnormal muscle tone.     Coordination: Coordination normal.     Gait: Gait normal.     Deep Tendon Reflexes: Reflexes are normal and symmetric.  Psychiatric:        Behavior: Behavior normal.        Thought Content: Thought content normal.        Judgment: Judgment normal.     Lab Results  Component Value Date   WBC 3.6 (L) 08/31/2022   HGB 12.9 (L) 08/31/2022   HCT 39.0 08/31/2022   PLT 222.0 08/31/2022   GLUCOSE 105 (H) 11/27/2022   CHOL 124 06/25/2022   TRIG 108.0 06/25/2022   HDL 48.10 06/25/2022   LDLDIRECT 47.0 10/12/2020   LDLCALC 54 06/25/2022   ALT 28 08/31/2022   AST 39 (H) 08/31/2022   NA 140 11/27/2022   K 3.7 11/27/2022   CL 101 11/27/2022   CREATININE 1.31 (H) 11/27/2022   BUN 14 11/27/2022   CO2 23 11/27/2022   TSH 2.16 06/25/2022   PSA 1.82 06/25/2022   HGBA1C 6.3 04/10/2021    CT ANGIO HEAD NECK W WO CM Result Date: 07/27/2021 CLINICAL DATA:  Neck trauma, impaired range of motion. EXAM: CT ANGIOGRAPHY HEAD AND NECK TECHNIQUE: Multidetector CT imaging of the head and neck was performed using the standard protocol during bolus administration of intravenous contrast. Multiplanar CT image reconstructions and MIPs were obtained to evaluate the vascular anatomy. Carotid stenosis measurements (when applicable) are obtained utilizing NASCET criteria, using the distal internal carotid diameter as the denominator. RADIATION DOSE REDUCTION: This exam was performed according to  the departmental dose-optimization program which includes automated exposure control, adjustment of the mA and/or kV according to patient size and/or use of iterative reconstruction technique. CONTRAST:  75mL OMNIPAQUE  IOHEXOL  350 MG/ML SOLN COMPARISON:  Head CT April 26, 2003. FINDINGS: CT HEAD FINDINGS Brain: No evidence of acute infarction, hemorrhage, hydrocephalus, extra-axial collection or mass lesion/mass effect. Vascular: No hyperdense vessel or unexpected calcification. Skull: Normal. Negative for fracture or focal lesion.  Sinuses: Mucosal thickening with bubbly secretion within the bilateral ethmoid cells and frontal sinuses. Orbits: No acute finding. Review of the MIP images confirms the above findings CTA NECK FINDINGS Aortic arch: Standard branching. Imaged portion shows no evidence of aneurysm or dissection. No significant stenosis of the major arch vessel origins. Right carotid system: Mild atherosclerotic changes in the right carotid bifurcation without hemodynamically significant stenosis. Left carotid system: Mild atherosclerotic changes in the left carotid bifurcation without hemodynamically significant stenosis. Vertebral arteries: Evaluation of the V1 segment of the left vertebral artery is precluded by artifact from dense contrast in adjacent vein. The most distal portion of the left A1 segment and the remainder of the left vertebral artery showed no evidence of occlusion or stenosis. The right vertebral artery has normal course and caliber throughout its entire course. The Skeleton: No acute or aggressive process identified. Other neck: Negative. Upper chest: Negative Review of the MIP images confirms the above findings CTA HEAD FINDINGS Anterior circulation: No significant stenosis, proximal occlusion, aneurysm, or vascular malformation. Posterior circulation: No significant stenosis, proximal occlusion, aneurysm, or vascular malformation. Venous sinuses: As permitted by contrast timing,  patent. Anatomic variants: None significant. Review of the MIP images confirms the above findings IMPRESSION: 1. No acute intracranial abnormality. 2. No intracranial large vessel occlusion or hemodynamically significant stenosis. 3. Mild atherosclerotic changes of the bilateral carotid bifurcations without hemodynamically significant stenosis. 4. No evidence of traumatic vascular injury to the major neck arteries. Electronically Signed   By: Katyucia  de Macedo Rodrigues M.D.   On: 07/27/2021 15:45    Assessment & Plan:   Problem List Items Addressed This Visit     Dyslipidemia   D/c Lopid  Start Vascepa       Relevant Orders   Lipid panel   TSH   Anxiety disorder   Alprazolam  prn  Potential benefits of a long term benzodiazepines  use as well as potential risks  and complications were explained to the patient and were aknowledged.      Essential hypertension - Primary   BP Readings from Last 3 Encounters:  05/09/23 (!) 140/78  01/15/23 118/88  11/27/22 (!) 152/74         Relevant Orders   Comprehensive metabolic panel with GFR   Erectile dysfunction   Chronic renal impairment, stage 3a (HCC)   Hydrate well On Vascepa          No orders of the defined types were placed in this encounter.     Follow-up: Return in about 6 months (around 11/09/2023) for Wellness Exam.  Anitra Barn, MD

## 2023-05-10 ENCOUNTER — Other Ambulatory Visit: Payer: Self-pay | Admitting: Internal Medicine

## 2023-05-10 ENCOUNTER — Encounter: Payer: Self-pay | Admitting: Internal Medicine

## 2023-05-10 MED ORDER — POTASSIUM CHLORIDE CRYS ER 20 MEQ PO TBCR: 20.0000 meq | EXTENDED_RELEASE_TABLET | Freq: Every day | ORAL | 0 refills | Status: DC

## 2023-05-13 ENCOUNTER — Other Ambulatory Visit: Payer: Self-pay | Admitting: Internal Medicine

## 2023-05-13 DIAGNOSIS — R748 Abnormal levels of other serum enzymes: Secondary | ICD-10-CM

## 2023-05-13 DIAGNOSIS — E785 Hyperlipidemia, unspecified: Secondary | ICD-10-CM

## 2023-05-13 DIAGNOSIS — I2583 Coronary atherosclerosis due to lipid rich plaque: Secondary | ICD-10-CM

## 2023-05-13 MED ORDER — PITAVASTATIN CALCIUM 4 MG PO TABS: 4.0000 mg | ORAL_TABLET | Freq: Every day | ORAL | 11 refills | Status: DC

## 2023-05-15 ENCOUNTER — Other Ambulatory Visit (HOSPITAL_COMMUNITY): Payer: Self-pay

## 2023-05-15 ENCOUNTER — Telehealth: Payer: Self-pay

## 2023-05-15 NOTE — Telephone Encounter (Signed)
 Pharmacy Patient Advocate Encounter   Received notification from Onbase that prior authorization for Pitavastatin Calcium 4MG  tablets is required/requested.   Insurance verification completed.   The patient is insured through [place] .   Per test claim: PA required; PA submitted to above mentioned insurance via CoverMyMeds Key/confirmation #/EOC [facility] Status is pending

## 2023-05-15 NOTE — Telephone Encounter (Signed)
 Pharmacy Patient Advocate Encounter  Received notification from [employer] that Prior Authorization for Pitavastatin Calcium 4MG  tablets has been DENIED.  See denial reason below. No denial letter attached in CMM. Will attach denial letter to Media tab once received.   PA #/Case ID/Reference #: [id number]

## 2023-05-17 MED ORDER — PITAVASTATIN MAGNESIUM 4 MG PO TABS: 4.0000 mg | ORAL_TABLET | Freq: Every day | ORAL | 3 refills | Status: DC

## 2023-05-17 NOTE — Addendum Note (Signed)
 Addended by: Ryle Buscemi V on: 05/17/2023 07:40 AM   Modules accepted: Orders

## 2023-05-17 NOTE — Telephone Encounter (Signed)
 Okay  Zypitamag Thx

## 2023-06-07 ENCOUNTER — Other Ambulatory Visit: Payer: Self-pay | Admitting: Internal Medicine

## 2023-06-07 MED ORDER — PITAVASTATIN MAGNESIUM 4 MG PO TABS: 4.0000 mg | ORAL_TABLET | Freq: Every day | ORAL | 1 refills | Status: DC

## 2023-06-25 ENCOUNTER — Other Ambulatory Visit: Payer: Self-pay | Admitting: Internal Medicine

## 2023-07-02 DIAGNOSIS — G4733 Obstructive sleep apnea (adult) (pediatric): Secondary | ICD-10-CM | POA: Diagnosis not present

## 2023-07-14 ENCOUNTER — Encounter: Payer: Self-pay | Admitting: Internal Medicine

## 2023-09-12 ENCOUNTER — Other Ambulatory Visit: Payer: Self-pay | Admitting: Internal Medicine

## 2023-09-27 ENCOUNTER — Encounter: Payer: Self-pay | Admitting: Internal Medicine

## 2023-09-27 ENCOUNTER — Ambulatory Visit: Admitting: Internal Medicine

## 2023-09-27 VITALS — BP 126/74 | HR 60 | Temp 97.8°F | Ht 71.0 in | Wt 231.0 lb

## 2023-09-27 DIAGNOSIS — I10 Essential (primary) hypertension: Secondary | ICD-10-CM | POA: Diagnosis not present

## 2023-09-27 DIAGNOSIS — G4733 Obstructive sleep apnea (adult) (pediatric): Secondary | ICD-10-CM | POA: Diagnosis not present

## 2023-09-27 DIAGNOSIS — E785 Hyperlipidemia, unspecified: Secondary | ICD-10-CM

## 2023-09-27 DIAGNOSIS — I2583 Coronary atherosclerosis due to lipid rich plaque: Secondary | ICD-10-CM | POA: Diagnosis not present

## 2023-09-27 DIAGNOSIS — R748 Abnormal levels of other serum enzymes: Secondary | ICD-10-CM | POA: Diagnosis not present

## 2023-09-27 DIAGNOSIS — F419 Anxiety disorder, unspecified: Secondary | ICD-10-CM

## 2023-09-27 DIAGNOSIS — G8929 Other chronic pain: Secondary | ICD-10-CM

## 2023-09-27 DIAGNOSIS — M25561 Pain in right knee: Secondary | ICD-10-CM

## 2023-09-27 LAB — COMPREHENSIVE METABOLIC PANEL WITH GFR
ALT: 40 U/L (ref 0–53)
AST: 50 U/L — ABNORMAL HIGH (ref 0–37)
Albumin: 4.4 g/dL (ref 3.5–5.2)
Alkaline Phosphatase: 60 U/L (ref 39–117)
BUN: 19 mg/dL (ref 6–23)
CO2: 29 meq/L (ref 19–32)
Calcium: 9.9 mg/dL (ref 8.4–10.5)
Chloride: 102 meq/L (ref 96–112)
Creatinine, Ser: 1.29 mg/dL (ref 0.40–1.50)
GFR: 54.93 mL/min — ABNORMAL LOW (ref >60.00)
Glucose, Bld: 102 mg/dL — ABNORMAL HIGH (ref 70–99)
Potassium: 3.2 meq/L — ABNORMAL LOW (ref 3.5–5.1)
Sodium: 137 meq/L (ref 135–145)
Total Bilirubin: 0.5 mg/dL (ref 0.2–1.2)
Total Protein: 7.2 g/dL (ref 6.0–8.3)

## 2023-09-27 LAB — LIPID PANEL
Cholesterol: 168 mg/dL (ref 0–200)
HDL: 32.4 mg/dL — ABNORMAL LOW (ref >39.00)
LDL Cholesterol: 65 mg/dL (ref 0–99)
NonHDL: 135.87
Total CHOL/HDL Ratio: 5
Triglycerides: 356 mg/dL — ABNORMAL HIGH (ref 0.0–149.0)
VLDL: 71.2 mg/dL — ABNORMAL HIGH (ref 0.0–40.0)

## 2023-09-27 LAB — CK: Total CK: 928 U/L — ABNORMAL HIGH (ref 17–232)

## 2023-09-27 MED ORDER — ESCITALOPRAM OXALATE 5 MG PO TABS: 5.0000 mg | ORAL_TABLET | Freq: Every day | ORAL | 5 refills | Status: DC

## 2023-09-27 NOTE — Assessment & Plan Note (Signed)
 Uncomfortable using CPAP Try chin strap

## 2023-09-27 NOTE — Progress Notes (Signed)
 Subjective:  Patient ID: Robert [name], male    DOB: 07-27-1950  Age: 73 y.o. MRN: [id number]  CC: Medical Management of Chronic Issues (F/u after taking cholesterol medication for 3 months)   HPI Robert [name] presents for R knee pain - will see Dr Melodi C/o anger, irritability x years - worse. Not depressed. On CPAP for OSA  Outpatient Medications Prior to Visit  Medication Sig Dispense Refill   ALPRAZolam  (XANAX ) 0.5 MG tablet Take 1 tablet (0.5 mg total) by mouth 2 (two) times daily as needed for anxiety. 60 tablet 3   Ascorbic Acid (VITAMIN C) 100 MG tablet Take 100 mg by mouth daily.     aspirin 81 MG tablet Take 81 mg by mouth daily.     Cholecalciferol (VITAMIN D3) 2000 units capsule Take 1 capsule (2,000 Units total) by mouth daily. 100 capsule 3   Ferrous Sulfate (IRON PO) Take 1 tablet by mouth daily.     levocetirizine (XYZAL ) 5 MG tablet TAKE 1 TABLET(5 MG) BY MOUTH EVERY EVENING 90 tablet 3   metoprolol  succinate (TOPROL -XL) 100 MG 24 hr tablet Take 1 tablet (100 mg total) by mouth daily. Take with or immediately following a meal. 90 tablet 3   Olmesartan -amLODIPine -HCTZ 40-5-12.5 MG TABS Take 1 tablet by mouth daily. 90 tablet 3   Omega-3 Fatty Acids (FISH OIL PO) Take by mouth.     omeprazole  (PRILOSEC) 20 MG capsule Take 1 capsule (20 mg total) by mouth 2 (two) times daily before a meal. 180 capsule 2   Pitavastatin  Magnesium  4 MG TABS Take 1 tablet (4 mg total) by mouth daily. 90 tablet 1   potassium chloride  SA (KLOR-CON  M) 20 MEQ tablet Take 1 tablet (20 mEq total) by mouth daily. 15 tablet 0   sildenafil  (VIAGRA ) 100 MG tablet Take 0.5-1 tablets (50-100 mg total) by mouth daily as needed for erectile dysfunction. 20 tablet 5   tamsulosin  (FLOMAX ) 0.4 MG CAPS capsule TAKE 1 CAPSULE BY MOUTH EVERY DAY AFTER SUPPER 90 capsule 3   GINSENG PO Take 3 tablets by mouth daily.     icosapent  Ethyl (VASCEPA ) 1 g capsule Take 2 capsules (2 g total) by mouth 2 (two) times daily.  360 capsule 3   tobramycin (TOBREX) 0.3 % ophthalmic solution SMARTSIG:In Eye(s)     No facility-administered medications prior to visit.    ROS: Review of Systems  Constitutional:  Negative for appetite change, fatigue and unexpected weight change.  HENT:  Negative for congestion, nosebleeds, sneezing, sore throat and trouble swallowing.   Eyes:  Negative for itching and visual disturbance.  Respiratory:  Negative for cough.   Cardiovascular:  Negative for chest pain, palpitations and leg swelling.  Gastrointestinal:  Negative for abdominal distention, blood in stool, diarrhea and nausea.  Genitourinary:  Negative for frequency and hematuria.  Musculoskeletal:  Negative for back pain, gait problem, joint swelling and neck pain.  Skin:  Negative for rash.  Neurological:  Negative for dizziness, tremors, speech difficulty and weakness.  Psychiatric/Behavioral:  Negative for agitation, dysphoric mood, sleep disturbance and suicidal ideas. The patient is nervous/anxious.   Not homicidal  Objective:  BP 126/74   Pulse 60   Temp 97.8 F (36.6 C) (Temporal)   Ht 5' 11 (1.803 m)   Wt 231 lb (104.8 kg)   SpO2 96%   BMI 32.22 kg/m   BP Readings from Last 3 Encounters:  09/27/23 126/74  05/09/23 135/80  01/15/23 118/88    Wt Readings  from Last 3 Encounters:  09/27/23 231 lb (104.8 kg)  05/09/23 235 lb 6.4 oz (106.8 kg)  04/03/23 227 lb (103 kg)    Physical Exam Constitutional:      General: He is not in acute distress.    Appearance: He is well-developed.     Comments: NAD  Eyes:     Conjunctiva/sclera: Conjunctivae normal.     Pupils: Pupils are equal, round, and reactive to light.  Neck:     Thyroid : No thyromegaly.     Vascular: No JVD.  Cardiovascular:     Rate and Rhythm: Normal rate and regular rhythm.     Heart sounds: Normal heart sounds. No murmur heard.    No friction rub. No gallop.  Pulmonary:     Effort: Pulmonary effort is normal. No respiratory  distress.     Breath sounds: Normal breath sounds. No wheezing or rales.  Chest:     Chest wall: No tenderness.  Abdominal:     General: Bowel sounds are normal. There is no distension.     Palpations: Abdomen is soft. There is no mass.     Tenderness: There is no abdominal tenderness. There is no guarding or rebound.  Musculoskeletal:        General: No tenderness. Normal range of motion.     Cervical back: Normal range of motion.     Right lower leg: No edema.     Left lower leg: No edema.  Lymphadenopathy:     Cervical: No cervical adenopathy.  Skin:    General: Skin is warm and dry.     Findings: No rash.  Neurological:     Mental Status: He is alert and oriented to person, place, and time.     Cranial Nerves: No cranial nerve deficit.     Motor: No abnormal muscle tone.     Coordination: Coordination normal.     Gait: Gait normal.     Deep Tendon Reflexes: Reflexes are normal and symmetric.  Psychiatric:        Behavior: Behavior normal.        Thought Content: Thought content normal.        Judgment: Judgment normal.   Very calm and peasant R knee w/pain on ROM  Lab Results  Component Value Date   WBC 3.6 (L) 08/31/2022   HGB 12.9 (L) 08/31/2022   HCT 39.0 08/31/2022   PLT 222.0 08/31/2022   GLUCOSE 95 05/09/2023   CHOL 296 (H) 05/09/2023   TRIG 272.0 (H) 05/09/2023   HDL 33.40 (L) 05/09/2023   LDLDIRECT 47.0 10/12/2020   LDLCALC 209 (H) 05/09/2023   ALT 44 05/09/2023   AST 54 (H) 05/09/2023   NA 135 05/09/2023   K 3.4 (L) 05/09/2023   CL 100 05/09/2023   CREATININE 1.42 05/09/2023   BUN 16 05/09/2023   CO2 27 05/09/2023   TSH 2.15 05/09/2023   PSA 1.82 06/25/2022   HGBA1C 6.3 04/10/2021    CT ANGIO HEAD NECK W WO CM Result Date: 07/27/2021 CLINICAL DATA:  Neck trauma, impaired range of motion. EXAM: CT ANGIOGRAPHY HEAD AND NECK TECHNIQUE: Multidetector CT imaging of the head and neck was performed using the standard protocol during bolus  administration of intravenous contrast. Multiplanar CT image reconstructions and MIPs were obtained to evaluate the vascular anatomy. Carotid stenosis measurements (when applicable) are obtained utilizing NASCET criteria, using the distal internal carotid diameter as the denominator. RADIATION DOSE REDUCTION: This exam was performed according to the  departmental dose-optimization program which includes automated exposure control, adjustment of the mA and/or kV according to patient size and/or use of iterative reconstruction technique. CONTRAST:  75mL OMNIPAQUE  IOHEXOL  350 MG/ML SOLN COMPARISON:  Head CT April 26, 2003. FINDINGS: CT HEAD FINDINGS Brain: No evidence of acute infarction, hemorrhage, hydrocephalus, extra-axial collection or mass lesion/mass effect. Vascular: No hyperdense vessel or unexpected calcification. Skull: Normal. Negative for fracture or focal lesion. Sinuses: Mucosal thickening with bubbly secretion within the bilateral ethmoid cells and frontal sinuses. Orbits: No acute finding. Review of the MIP images confirms the above findings CTA NECK FINDINGS Aortic arch: Standard branching. Imaged portion shows no evidence of aneurysm or dissection. No significant stenosis of the major arch vessel origins. Right carotid system: Mild atherosclerotic changes in the right carotid bifurcation without hemodynamically significant stenosis. Left carotid system: Mild atherosclerotic changes in the left carotid bifurcation without hemodynamically significant stenosis. Vertebral arteries: Evaluation of the V1 segment of the left vertebral artery is precluded by artifact from dense contrast in adjacent vein. The most distal portion of the left A1 segment and the remainder of the left vertebral artery showed no evidence of occlusion or stenosis. The right vertebral artery has normal course and caliber throughout its entire course. The Skeleton: No acute or aggressive process identified. Other neck: Negative. Upper  chest: Negative Review of the MIP images confirms the above findings CTA HEAD FINDINGS Anterior circulation: No significant stenosis, proximal occlusion, aneurysm, or vascular malformation. Posterior circulation: No significant stenosis, proximal occlusion, aneurysm, or vascular malformation. Venous sinuses: As permitted by contrast timing, patent. Anatomic variants: None significant. Review of the MIP images confirms the above findings IMPRESSION: 1. No acute intracranial abnormality. 2. No intracranial large vessel occlusion or hemodynamically significant stenosis. 3. Mild atherosclerotic changes of the bilateral carotid bifurcations without hemodynamically significant stenosis. 4. No evidence of traumatic vascular injury to the major neck arteries. Electronically Signed   By: Katyucia  de Macedo Rodrigues M.D.   On: 07/27/2021 15:45    Assessment & Plan:   Problem List Items Addressed This Visit     Anxiety disorder   Anger, irritability x years - worse. Not depressed. Psychatry ref. Trial of Lexapro  - low dose      Relevant Medications   escitalopram  (LEXAPRO ) 5 MG tablet   Other Relevant Orders   Ambulatory referral to Psychiatry   Coronary atherosclerosis   Dyslipidemia   Elevated CK   Essential hypertension   BP Readings from Last 3 Encounters:  09/27/23 126/74  05/09/23 135/80  01/15/23 118/88         Knee pain, right - Primary   Chronic R knee pain - he will see Dr Melodi in Nov      OSA on CPAP   Uncomfortable using CPAP Try chin strap         Meds ordered this encounter  Medications   escitalopram  (LEXAPRO ) 5 MG tablet    Sig: Take 1 tablet (5 mg total) by mouth at bedtime.    Dispense:  30 tablet    Refill:  5      Follow-up: Return in about 3 months (around 12/27/2023) for a follow-up visit.  Marolyn Noel, MD

## 2023-09-27 NOTE — Assessment & Plan Note (Signed)
 Anger, irritability x years - worse. Not depressed. Psychatry ref. Trial of Lexapro  - low dose

## 2023-09-27 NOTE — Patient Instructions (Signed)
 Try a chin strap

## 2023-09-27 NOTE — Assessment & Plan Note (Signed)
 BP Readings from Last 3 Encounters:  09/27/23 126/74  05/09/23 135/80  01/15/23 118/88

## 2023-09-27 NOTE — Assessment & Plan Note (Signed)
 Chronic R knee pain - he will see Dr Melodi in Nov

## 2023-09-29 ENCOUNTER — Other Ambulatory Visit: Payer: Self-pay | Admitting: Internal Medicine

## 2023-09-30 DIAGNOSIS — M25561 Pain in right knee: Secondary | ICD-10-CM | POA: Diagnosis not present

## 2023-10-01 ENCOUNTER — Other Ambulatory Visit: Payer: Self-pay | Admitting: Internal Medicine

## 2023-10-01 ENCOUNTER — Ambulatory Visit: Payer: Self-pay | Admitting: Internal Medicine

## 2023-10-01 MED ORDER — POTASSIUM CHLORIDE CRYS ER 20 MEQ PO TBCR: 20.0000 meq | EXTENDED_RELEASE_TABLET | Freq: Every day | ORAL | 3 refills | Status: AC

## 2023-10-04 DIAGNOSIS — R2241 Localized swelling, mass and lump, right lower limb: Secondary | ICD-10-CM | POA: Diagnosis not present

## 2023-10-14 ENCOUNTER — Other Ambulatory Visit: Payer: Self-pay | Admitting: General Surgery

## 2023-10-14 DIAGNOSIS — R2241 Localized swelling, mass and lump, right lower limb: Secondary | ICD-10-CM | POA: Diagnosis not present

## 2023-10-31 NOTE — Progress Notes (Signed)
 Surgical Instructions   Your procedure is scheduled on Monday November 3. Report to [facility] Main Entrance A at 5:30 A.M., then check in with the Admitting office. Any questions or running late day of surgery: call [phone]  Questions prior to your surgery date: call [phone], Monday-Friday, 8am-4pm. If you experience any cold or flu symptoms such as cough, fever, chills, shortness of breath, etc. between now and your scheduled surgery, please notify us  at the above number.     Remember:  Do not eat after midnight the night before your surgery   You may drink clear liquids until 4:30am the morning of your surgery.   Clear liquids allowed are: Water, Non-Citrus Juices (without pulp), Carbonated Beverages, Clear Tea (no milk, honey, etc.), Black Coffee Only (NO MILK, CREAM OR POWDERED CREAMER of any kind), and Gatorade.  The night before surgery:  No food after midnight. ONLY clear liquids after midnight  The day of surgery (if you do NOT have diabetes):  Drink ONE (1) Pre-Surgery Clear Ensure by 4:30am the morning of surgery. Drink in one sitting. Do not sip.  This drink was given to you during your hospital  pre-op appointment visit.  Nothing else to drink after completing the  Pre-Surgery Clear Ensure.         If you have questions, please contact your surgeon's office.    Take these medicines the morning of surgery with A SIP OF WATER metoprolol  succinate (TOPROL -XL)  omeprazole  (PRILOSEC)  Pitavastatin  Magnesium    May take these medicines IF NEEDED: ALPRAZolam  (XANAX )    One week prior to surgery, STOP taking any  Aleve, Naproxen, Ibuprofen, Motrin, Advil, Goody's, BC's, all herbal medications, fish oil, and non-prescription vitamins.           FOLLOW YOUR SURGEON'S INSTRUCTIONS REGARDING WHEN TO HOLD ASPIRIN. IF NO INSTRUCTIONS WERE GIVEN, YOU WILL NEED TO CALL THE OFFICE.         Do NOT Smoke (Tobacco/Vaping) for 24 hours prior to your procedure.  If you  use a CPAP at night, you may bring your mask/headgear for your overnight stay.   You will be asked to remove any contacts, glasses, piercing's, hearing aid's, dentures/partials prior to surgery. Please bring cases for these items if needed.    Patients discharged the day of surgery will not be allowed to drive home, and someone needs to stay with them for 24 hours.  SURGICAL WAITING ROOM VISITATION Patients may have no more than 2 support people in the waiting area - these visitors may rotate.   Pre-op nurse will coordinate an appropriate time for 1 ADULT support person, who may not rotate, to accompany patient in pre-op.  Children under the age of 38 must have an adult with them who is not the patient and must remain in the main waiting area with an adult.  If the patient needs to stay at the hospital during part of their recovery, the visitor guidelines for inpatient rooms apply.  Please refer to the [facility] website for the visitor guidelines for any additional information.   If you received a COVID test during your pre-op visit  it is requested that you wear a mask when out in public, stay away from anyone that may not be feeling well and notify your surgeon if you develop symptoms. If you have been in contact with anyone that has tested positive in the last 10 days please notify you surgeon.      Pre-operative CHG Bathing Instructions  You can play a key role in reducing the risk of infection after surgery. Your skin needs to be as free of germs as possible. You can reduce the number of germs on your skin by washing with CHG (chlorhexidine gluconate) soap before surgery. CHG is an antiseptic soap that kills germs and continues to kill germs even after washing.   DO NOT use if you have an allergy to chlorhexidine/CHG or antibacterial soaps. If your skin becomes reddened or irritated, stop using the CHG and notify one of our RNs at [phone].              TAKE A SHOWER THE NIGHT  BEFORE SURGERY   Please keep in mind the following:  DO NOT shave, including legs and underarms, 48 hours prior to surgery.   You may shave your face before/day of surgery.  Place clean sheets on your bed the night before surgery Use a clean washcloth (not used since being washed) for shower. DO NOT sleep with pet's night before surgery.  CHG Shower Instructions:  Wash your face and private area with normal soap. If you choose to wash your hair, wash first with your normal shampoo.  After you use shampoo/soap, rinse your hair and body thoroughly to remove shampoo/soap residue.  Turn the water OFF and apply half the bottle of CHG soap to a CLEAN washcloth.  Apply CHG soap ONLY FROM YOUR NECK DOWN TO YOUR TOES (washing for 3-5 minutes)  DO NOT use CHG soap on face, private areas, open wounds, or sores.  Pay special attention to the area where your surgery is being performed.  If you are having back surgery, having someone wash your back for you may be helpful. Wait 2 minutes after CHG soap is applied, then you may rinse off the CHG soap.  Pat dry with a clean towel  Put on clean pajamas    Additional instructions for the day of surgery: If you choose, you may shower the morning of surgery with an antibacterial soap.  DO NOT APPLY any lotions, deodorants, cologne, or perfumes.   Do not wear jewelry or makeup Do not wear nail polish, gel polish, artificial nails, or any other type of covering on natural nails (fingers and toes) Do not bring valuables to the hospital. [facility] is not responsible for valuables/personal belongings. Put on clean/comfortable clothes.  Please brush your teeth.  Ask your nurse before applying any prescription medications to the skin.

## 2023-11-01 ENCOUNTER — Encounter (HOSPITAL_COMMUNITY): Payer: Self-pay

## 2023-11-01 ENCOUNTER — Other Ambulatory Visit: Payer: Self-pay

## 2023-11-01 ENCOUNTER — Encounter (HOSPITAL_COMMUNITY)
Admission: RE | Admit: 2023-11-01 | Discharge: 2023-11-01 | Disposition: A | Discharge status: Home / Self Care | Source: Ambulatory Visit | Attending: General Surgery | Admitting: General Surgery

## 2023-11-01 VITALS — BP 161/76 | HR 58 | Temp 97.9°F | Resp 18 | Ht 71.0 in | Wt 232.2 lb

## 2023-11-01 DIAGNOSIS — Z01812 Encounter for preprocedural laboratory examination: Secondary | ICD-10-CM | POA: Diagnosis not present

## 2023-11-01 DIAGNOSIS — Z01818 Encounter for other preprocedural examination: Secondary | ICD-10-CM | POA: Diagnosis present

## 2023-11-01 DIAGNOSIS — I10 Essential (primary) hypertension: Secondary | ICD-10-CM | POA: Diagnosis not present

## 2023-11-01 HISTORY — DX: Personal history of pneumonia (recurrent): Z87.01

## 2023-11-01 HISTORY — DX: Unspecified osteoarthritis, unspecified site: M19.90

## 2023-11-01 LAB — BASIC METABOLIC PANEL WITH GFR
Anion gap: 9 (ref 5–15)
BUN: 20 mg/dL (ref 8–23)
CO2: 25 mmol/L (ref 22–32)
Calcium: 9.8 mg/dL (ref 8.9–10.3)
Chloride: 100 mmol/L (ref 98–111)
Creatinine, Ser: 1.35 mg/dL — ABNORMAL HIGH (ref 0.61–1.24)
GFR, Estimated: 55 mL/min — ABNORMAL LOW (ref >60)
Glucose, Bld: 105 mg/dL — ABNORMAL HIGH (ref 70–99)
Potassium: 3.9 mmol/L (ref 3.5–5.1)
Sodium: 134 mmol/L — ABNORMAL LOW (ref 135–145)

## 2023-11-01 LAB — CBC
HCT: 40.8 % (ref 39.0–52.0)
Hemoglobin: 13.7 g/dL (ref 13.0–17.0)
MCH: 28 pg (ref 26.0–34.0)
MCHC: 33.6 g/dL (ref 30.0–36.0)
MCV: 83.4 fL (ref 80.0–100.0)
Platelets: 187 K/uL (ref 150–400)
RBC: 4.89 MIL/uL (ref 4.22–5.81)
RDW: 13.5 % (ref 11.5–15.5)
WBC: 3.6 K/uL — ABNORMAL LOW (ref 4.0–10.5)
nRBC: 0 % (ref 0.0–0.2)

## 2023-11-01 NOTE — Progress Notes (Signed)
 Surgical Instructions   Your procedure is scheduled on Monday November 3. Report to [facility] Main Entrance A at 5:30 A.M., then check in with the Admitting office. Any questions or running late day of surgery: call [phone]  Questions prior to your surgery date: call [phone], Monday-Friday, 8am-4pm. If you experience any cold or flu symptoms such as cough, fever, chills, shortness of breath, etc. between now and your scheduled surgery, please notify us  at the above number.     Remember:  Do not eat after midnight the night before your surgery   You may drink clear liquids until 4:30am the morning of your surgery.   Clear liquids allowed are: Water, Non-Citrus Juices (without pulp), Carbonated Beverages, Clear Tea (no milk, honey, etc.), Black Coffee Only (NO MILK, CREAM OR POWDERED CREAMER of any kind), and Gatorade.  The night before surgery:  No food after midnight. ONLY clear liquids after midnight  The day of surgery (if you do NOT have diabetes):  Drink ONE (1) Pre-Surgery Clear Ensure by 4:30am the morning of surgery. Drink in one sitting. Do not sip.  This drink was given to you during your hospital  pre-op appointment visit.  Nothing else to drink after completing the  Pre-Surgery Clear Ensure.         If you have questions, please contact your surgeon's office.    Take these medicines the morning of surgery with A SIP OF WATER: metoprolol  succinate (TOPROL -XL)  omeprazole  (PRILOSEC)    May take these medicines IF NEEDED: ALPRAZolam  (XANAX )    One week prior to surgery, STOP taking any  Aleve, Naproxen, Ibuprofen, Motrin, Advil, Goody's, BC's, all herbal medications, fish oil, and non-prescription vitamins.          FOLLOW YOUR SURGEON'S INSTRUCTIONS REGARDING WHEN TO HOLD ASPIRIN. IF NO INSTRUCTIONS WERE GIVEN, YOU WILL NEED TO CALL THE OFFICE.          Do NOT Smoke (Tobacco/Vaping) for 24 hours prior to your procedure.  If you use a CPAP at night,  you may bring your mask/headgear for your overnight stay.   You will be asked to remove any contacts, glasses, piercing's, hearing aid's, dentures/partials prior to surgery. Please bring cases for these items if needed.    Patients discharged the day of surgery will not be allowed to drive home, and someone needs to stay with them for 24 hours.  SURGICAL WAITING ROOM VISITATION Patients may have no more than 2 support people in the waiting area - these visitors may rotate.   Pre-op nurse will coordinate an appropriate time for 1 ADULT support person, who may not rotate, to accompany patient in pre-op.  Children under the age of 63 must have an adult with them who is not the patient and must remain in the main waiting area with an adult.  If the patient needs to stay at the hospital during part of their recovery, the visitor guidelines for inpatient rooms apply.  Please refer to the [facility] website for the visitor guidelines for any additional information.   If you received a COVID test during your pre-op visit  it is requested that you wear a mask when out in public, stay away from anyone that may not be feeling well and notify your surgeon if you develop symptoms. If you have been in contact with anyone that has tested positive in the last 10 days please notify you surgeon.      Pre-operative CHG Bathing Instructions   You  can play a key role in reducing the risk of infection after surgery. Your skin needs to be as free of germs as possible. You can reduce the number of germs on your skin by washing with CHG (chlorhexidine gluconate) soap before surgery. CHG is an antiseptic soap that kills germs and continues to kill germs even after washing.   DO NOT use if you have an allergy to chlorhexidine/CHG or antibacterial soaps. If your skin becomes reddened or irritated, stop using the CHG and notify one of our RNs at [phone].              TAKE A SHOWER THE NIGHT BEFORE SURGERY   Please  keep in mind the following:  DO NOT shave, including legs and underarms, 48 hours prior to surgery.   You may shave your face before/day of surgery.  Place clean sheets on your bed the night before surgery Use a clean washcloth (not used since being washed) for shower. DO NOT sleep with pet's night before surgery.  CHG Shower Instructions:  Wash your face and private area with normal soap. If you choose to wash your hair, wash first with your normal shampoo.  After you use shampoo/soap, rinse your hair and body thoroughly to remove shampoo/soap residue.  Turn the water OFF and apply half the bottle of CHG soap to a CLEAN washcloth.  Apply CHG soap ONLY FROM YOUR NECK DOWN TO YOUR TOES (washing for 3-5 minutes)  DO NOT use CHG soap on face, private areas, open wounds, or sores.  Pay special attention to the area where your surgery is being performed.  If you are having back surgery, having someone wash your back for you may be helpful. Wait 2 minutes after CHG soap is applied, then you may rinse off the CHG soap.  Pat dry with a clean towel  Put on clean pajamas    Additional instructions for the day of surgery: If you choose, you may shower the morning of surgery with an antibacterial soap.  DO NOT APPLY any lotions, deodorants, cologne, or perfumes.   Do not wear jewelry or makeup Do not wear nail polish, gel polish, artificial nails, or any other type of covering on natural nails (fingers and toes) Do not bring valuables to the hospital. [facility] is not responsible for valuables/personal belongings. Put on clean/comfortable clothes.  Please brush your teeth.  Ask your nurse before applying any prescription medications to the skin.

## 2023-11-01 NOTE — Progress Notes (Signed)
 PCP -    Plotnikov, Karlynn GAILS, MD   Cardiologist - Ronal Ross- follow up as needed  PPM/ICD - denies   Chest x-ray - N/A EKG - 11/27/22 Stress Test - 03/27/23 ECHO - denies Cardiac Cath - denies  Sleep Study - pt uses CPAP- unsure of pressure settings 02/05/20   Fasting Blood Sugar - N/A  Last dose of GLP1 agonist-  N/A   Blood Thinner Instructions: N/A Aspirin Instructions: Pt to call surgeon's office for instructions  ERAS Protcol - ERAS + ensure   COVID TEST- N/A   Anesthesia review: no  Patient denies shortness of breath, fever, cough and chest pain at PAT appointment   All instructions explained to the patient, with a verbal understanding of the material. Patient agrees to go over the instructions while at home for a better understanding.  The opportunity to ask questions was provided.

## 2023-11-03 NOTE — Progress Notes (Signed)
 Late entry for 0730  Received call from patient regarding his upcoming surgery tomorrow.  He had some concerns and I gave him [facility] number to call and talk to the surgeon's office and speak to the on call person.  I also informed him that if he did not speak to anyone at [facility] he still needed to show up for his scheduled surgery here tomorrow and we could assess at that time.

## 2023-11-04 ENCOUNTER — Ambulatory Visit (HOSPITAL_COMMUNITY): Admitting: Anesthesiology

## 2023-11-04 ENCOUNTER — Ambulatory Visit (HOSPITAL_COMMUNITY)
Admission: RE | Admit: 2023-11-04 | Discharge: 2023-11-04 | Disposition: A | Discharge status: Home / Self Care | Attending: General Surgery | Admitting: General Surgery

## 2023-11-04 ENCOUNTER — Encounter (HOSPITAL_COMMUNITY)
Admission: RE | Disposition: A | Discharge status: Home / Self Care | Payer: Self-pay | Source: Home / Self Care | Attending: General Surgery

## 2023-11-04 DIAGNOSIS — G4730 Sleep apnea, unspecified: Secondary | ICD-10-CM | POA: Insufficient documentation

## 2023-11-04 DIAGNOSIS — Z87891 Personal history of nicotine dependence: Secondary | ICD-10-CM | POA: Diagnosis not present

## 2023-11-04 DIAGNOSIS — R2241 Localized swelling, mass and lump, right lower limb: Secondary | ICD-10-CM | POA: Diagnosis not present

## 2023-11-04 DIAGNOSIS — D1723 Benign lipomatous neoplasm of skin and subcutaneous tissue of right leg: Secondary | ICD-10-CM | POA: Insufficient documentation

## 2023-11-04 DIAGNOSIS — I129 Hypertensive chronic kidney disease with stage 1 through stage 4 chronic kidney disease, or unspecified chronic kidney disease: Secondary | ICD-10-CM | POA: Diagnosis not present

## 2023-11-04 DIAGNOSIS — N189 Chronic kidney disease, unspecified: Secondary | ICD-10-CM | POA: Insufficient documentation

## 2023-11-04 DIAGNOSIS — K219 Gastro-esophageal reflux disease without esophagitis: Secondary | ICD-10-CM | POA: Diagnosis not present

## 2023-11-04 HISTORY — PX: EXCISION MASS LOWER EXTREMETIES: SHX6705

## 2023-11-04 SURGERY — EXCISION MASS LOWER EXTREMITIES
Anesthesia: General | Laterality: Right

## 2023-11-04 MED ORDER — ACETAMINOPHEN 500 MG PO TABS
1000.0000 mg | ORAL_TABLET | ORAL | Status: AC
  Administered 2023-11-04: 1000 mg via ORAL
  Filled 2023-11-04: qty 2

## 2023-11-04 MED ORDER — FENTANYL CITRATE (PF) 250 MCG/5ML IJ SOLN
INTRAMUSCULAR | Status: DC | PRN
  Administered 2023-11-04: 25 ug via INTRAVENOUS
  Administered 2023-11-04: 100 ug via INTRAVENOUS

## 2023-11-04 MED ORDER — LACTATED RINGERS IV SOLN: INTRAVENOUS | Status: DC

## 2023-11-04 MED ORDER — BUPIVACAINE-EPINEPHRINE 0.25% -1:200000 IJ SOLN
INTRAMUSCULAR | Status: DC | PRN
  Administered 2023-11-04: 10 mL

## 2023-11-04 MED ORDER — ROCURONIUM BROMIDE 10 MG/ML (PF) SYRINGE
PREFILLED_SYRINGE | INTRAVENOUS | Status: AC
Start: 2023-11-04 — End: 2023-11-04
  Filled 2023-11-04: qty 10

## 2023-11-04 MED ORDER — OXYCODONE HCL 5 MG/5ML PO SOLN: 5.0000 mg | Freq: Once | ORAL | Status: DC | PRN

## 2023-11-04 MED ORDER — MIDAZOLAM HCL 2 MG/2ML IJ SOLN
INTRAMUSCULAR | Status: AC
  Filled 2023-11-04: qty 2

## 2023-11-04 MED ORDER — CHLORHEXIDINE GLUCONATE CLOTH 2 % EX PADS: 6.0000 | MEDICATED_PAD | Freq: Once | CUTANEOUS | Status: DC

## 2023-11-04 MED ORDER — ENSURE PRE-SURGERY PO LIQD: 296.0000 mL | Freq: Once | ORAL | Status: DC

## 2023-11-04 MED ORDER — OXYCODONE HCL 5 MG PO TABS: 5.0000 mg | ORAL_TABLET | ORAL | Status: DC | PRN

## 2023-11-04 MED ORDER — ACETAMINOPHEN 325 MG PO TABS: 650.0000 mg | ORAL_TABLET | ORAL | Status: DC | PRN

## 2023-11-04 MED ORDER — PROPOFOL 10 MG/ML IV BOLUS
INTRAVENOUS | Status: DC | PRN
  Administered 2023-11-04: 200 mg via INTRAVENOUS

## 2023-11-04 MED ORDER — SODIUM CHLORIDE 0.9 % IV SOLN: 250.0000 mL | INTRAVENOUS | Status: DC | PRN

## 2023-11-04 MED ORDER — BUPIVACAINE-EPINEPHRINE (PF) 0.25% -1:200000 IJ SOLN
INTRAMUSCULAR | Status: AC
  Filled 2023-11-04: qty 30

## 2023-11-04 MED ORDER — DEXAMETHASONE SOD PHOSPHATE PF 10 MG/ML IJ SOLN
INTRAMUSCULAR | Status: DC | PRN
  Administered 2023-11-04: 5 mg via INTRAVENOUS

## 2023-11-04 MED ORDER — OXYCODONE HCL 5 MG PO TABS: 5.0000 mg | ORAL_TABLET | Freq: Once | ORAL | Status: DC | PRN

## 2023-11-04 MED ORDER — SUGAMMADEX SODIUM 200 MG/2ML IV SOLN
INTRAVENOUS | Status: DC | PRN
  Administered 2023-11-04: 200 mg via INTRAVENOUS

## 2023-11-04 MED ORDER — CHLORHEXIDINE GLUCONATE 0.12 % MT SOLN
15.0000 mL | Freq: Once | OROMUCOSAL | Status: AC
  Administered 2023-11-04: 15 mL via OROMUCOSAL
  Filled 2023-11-04: qty 15

## 2023-11-04 MED ORDER — LIDOCAINE 2% (20 MG/ML) 5 ML SYRINGE
INTRAMUSCULAR | Status: DC | PRN
  Administered 2023-11-04: 60 mg via INTRAVENOUS

## 2023-11-04 MED ORDER — AMISULPRIDE (ANTIEMETIC) 5 MG/2ML IV SOLN: 10.0000 mg | Freq: Once | INTRAVENOUS | Status: DC | PRN

## 2023-11-04 MED ORDER — PROPOFOL 10 MG/ML IV BOLUS
INTRAVENOUS | Status: AC
  Filled 2023-11-04: qty 20

## 2023-11-04 MED ORDER — SUCCINYLCHOLINE CHLORIDE 200 MG/10ML IV SOSY
PREFILLED_SYRINGE | INTRAVENOUS | Status: AC
  Filled 2023-11-04: qty 10

## 2023-11-04 MED ORDER — SUCCINYLCHOLINE CHLORIDE 200 MG/10ML IV SOSY
PREFILLED_SYRINGE | INTRAVENOUS | Status: DC | PRN
  Administered 2023-11-04: 120 mg via INTRAVENOUS

## 2023-11-04 MED ORDER — CEFAZOLIN SODIUM-DEXTROSE 2-4 GM/100ML-% IV SOLN
2.0000 g | INTRAVENOUS | Status: AC
  Administered 2023-11-04: 2 g via INTRAVENOUS
  Filled 2023-11-04: qty 100

## 2023-11-04 MED ORDER — ORAL CARE MOUTH RINSE: 15.0000 mL | Freq: Once | OROMUCOSAL | Status: AC

## 2023-11-04 MED ORDER — ROCURONIUM BROMIDE 10 MG/ML (PF) SYRINGE
PREFILLED_SYRINGE | INTRAVENOUS | Status: DC | PRN
  Administered 2023-11-04: 30 mg via INTRAVENOUS

## 2023-11-04 MED ORDER — ONDANSETRON HCL 4 MG/2ML IJ SOLN
INTRAMUSCULAR | Status: DC | PRN
  Administered 2023-11-04: 4 mg via INTRAVENOUS

## 2023-11-04 MED ORDER — CHLORHEXIDINE GLUCONATE CLOTH 2 % EX PADS
6.0000 | MEDICATED_PAD | Freq: Once | CUTANEOUS | Status: AC
  Administered 2023-11-04: 6 via TOPICAL

## 2023-11-04 MED ORDER — 0.9 % SODIUM CHLORIDE (POUR BTL) OPTIME
TOPICAL | Status: DC | PRN
Start: 2023-11-04 — End: 2023-11-04
  Administered 2023-11-04: 1000 mL

## 2023-11-04 MED ORDER — LIDOCAINE 2% (20 MG/ML) 5 ML SYRINGE
INTRAMUSCULAR | Status: AC
  Filled 2023-11-04: qty 5

## 2023-11-04 MED ORDER — ACETAMINOPHEN 650 MG RE SUPP: 650.0000 mg | RECTAL | Status: DC | PRN

## 2023-11-04 MED ORDER — SODIUM CHLORIDE 0.9% FLUSH
3.0000 mL | Freq: Two times a day (BID) | INTRAVENOUS | Status: DC
Start: 2023-11-04 — End: 2023-11-04

## 2023-11-04 MED ORDER — FENTANYL CITRATE (PF) 100 MCG/2ML IJ SOLN: 25.0000 ug | INTRAMUSCULAR | Status: DC | PRN

## 2023-11-04 MED ORDER — SODIUM CHLORIDE 0.9% FLUSH: 3.0000 mL | INTRAVENOUS | Status: DC | PRN

## 2023-11-04 MED ORDER — FENTANYL CITRATE (PF) 250 MCG/5ML IJ SOLN
INTRAMUSCULAR | Status: AC
  Filled 2023-11-04: qty 5

## 2023-11-04 SURGICAL SUPPLY — 25 items
BLADE CLIPPER SURG (BLADE) IMPLANT
CHLORAPREP W/TINT 26 (MISCELLANEOUS) ×1 IMPLANT
COVER SURGICAL LIGHT HANDLE (MISCELLANEOUS) ×1 IMPLANT
DERMABOND ADVANCED .7 DNX12 (GAUZE/BANDAGES/DRESSINGS) ×1 IMPLANT
DRAPE LAPAROTOMY 100X72 PEDS (DRAPES) ×1 IMPLANT
ELECT CAUTERY BLADE 6.4 (BLADE) ×1 IMPLANT
ELECTRODE REM PT RTRN 9FT ADLT (ELECTROSURGICAL) ×1 IMPLANT
GLOVE BIO SURGEON STRL SZ7 (GLOVE) ×1 IMPLANT
GLOVE BIOGEL PI IND STRL 7.5 (GLOVE) ×1 IMPLANT
GOWN STRL REUS W/ TWL LRG LVL3 (GOWN DISPOSABLE) ×2 IMPLANT
KIT BASIN OR (CUSTOM PROCEDURE TRAY) ×1 IMPLANT
KIT TURNOVER KIT B (KITS) ×1 IMPLANT
NDL HYPO 25GX1X1/2 BEV (NEEDLE) ×1 IMPLANT
NEEDLE HYPO 25GX1X1/2 BEV (NEEDLE) ×1 IMPLANT
PACK GENERAL/GYN (CUSTOM PROCEDURE TRAY) ×1 IMPLANT
PAD ARMBOARD POSITIONER FOAM (MISCELLANEOUS) ×1 IMPLANT
PENCIL SMOKE EVACUATOR (MISCELLANEOUS) ×1 IMPLANT
SOLN 0.9% NACL POUR BTL 1000ML (IV SOLUTION) ×1 IMPLANT
SPIKE FLUID TRANSFER (MISCELLANEOUS) ×1 IMPLANT
STAPLER SKIN PROX 35W (STAPLE) ×1 IMPLANT
SUT MNCRL AB 4-0 PS2 18 (SUTURE) ×1 IMPLANT
SUT VIC AB 2-0 SH 27X BRD (SUTURE) ×1 IMPLANT
SUT VIC AB 3-0 SH 27XBRD (SUTURE) ×1 IMPLANT
SYR CONTROL 10ML LL (SYRINGE) ×1 IMPLANT
TOWEL GREEN STERILE FF (TOWEL DISPOSABLE) ×1 IMPLANT

## 2023-11-04 NOTE — Anesthesia Preprocedure Evaluation (Signed)
 Anesthesia Evaluation  Patient identified by MRN, date of birth, ID band Patient awake    Reviewed: Allergy & Precautions, NPO status , Patient's Chart, lab work & pertinent test results  Airway Mallampati: II  TM Distance: >3 FB Neck ROM: Full    Dental  (+) Dental Advisory Given   Pulmonary sleep apnea and Continuous Positive Airway Pressure Ventilation , former smoker   breath sounds clear to auscultation       Cardiovascular hypertension, Pt. on medications and Pt. on home beta blockers  Rhythm:Regular Rate:Normal     Neuro/Psych negative neurological ROS     GI/Hepatic Neg liver ROS,GERD  ,,  Endo/Other  negative endocrine ROS    Renal/GU CRFRenal disease     Musculoskeletal  (+) Arthritis ,    Abdominal   Peds  Hematology negative hematology ROS (+)   Anesthesia Other Findings   Reproductive/Obstetrics                              Anesthesia Physical Anesthesia Plan  ASA: 2  Anesthesia Plan: General   Post-op Pain Management: Tylenol  PO (pre-op)*   Induction: Intravenous  PONV Risk Score and Plan: 2 and Dexamethasone, Ondansetron and Treatment may vary due to age or medical condition  Airway Management Planned: Oral ETT  Additional Equipment: None  Intra-op Plan:   Post-operative Plan: Extubation in OR  Informed Consent: I have reviewed the patients History and Physical, chart, labs and discussed the procedure including the risks, benefits and alternatives for the proposed anesthesia with the patient or authorized representative who has indicated his/her understanding and acceptance.     Dental advisory given  Plan Discussed with: CRNA  Anesthesia Plan Comments:         Anesthesia Quick Evaluation

## 2023-11-04 NOTE — Transfer of Care (Signed)
 Immediate Anesthesia Transfer of Care Note  Patient: Robert [name]  Procedure(s) Performed: EXCISION MASS LOWER EXTREMITIES (Right)  Patient Location: PACU  Anesthesia Type:General  Level of Consciousness: awake, alert , and oriented  Airway & Oxygen Therapy: Patient Spontanous Breathing and Patient connected to face mask oxygen  Post-op Assessment: Report given to RN and Post -op Vital signs reviewed and stable  Post vital signs: Reviewed and stable  Last Vitals:  Vitals Value Taken Time  BP 153/72 11/04/23 08:22  Temp    Pulse 46 11/04/23 08:26  Resp 13 11/04/23 08:26  SpO2 100 % 11/04/23 08:26  Vitals shown include unfiled device data.  Last Pain:  Vitals:   11/04/23 0637  TempSrc:   PainSc: 0-No pain         Complications: No notable events documented.

## 2023-11-04 NOTE — Anesthesia Procedure Notes (Signed)
 Procedure Name: Intubation Date/Time: 11/04/2023 7:32 AM  Performed by: Donisha Hoch J, CRNAPre-anesthesia Checklist: Patient identified, Emergency Drugs available, Suction available and Patient being monitored Patient Re-evaluated:Patient Re-evaluated prior to induction Oxygen Delivery Method: Circle System Utilized Preoxygenation: Pre-oxygenation with 100% oxygen Induction Type: IV induction Ventilation: Mask ventilation without difficulty Laryngoscope Size: Miller and 3 Grade View: Grade I Tube type: Oral Tube size: 7.0 mm Number of attempts: 1 Airway Equipment and Method: Stylet and Oral airway Placement Confirmation: ETT inserted through vocal cords under direct vision, positive ETCO2 and breath sounds checked- equal and bilateral Secured at: 23 cm Tube secured with: Tape Dental Injury: Teeth and Oropharynx as per pre-operative assessment

## 2023-11-04 NOTE — Discharge Instructions (Signed)
 [facility]    you may take acetaminophen  (Tylenol ), naprosyn (Alleve) or ibuprofen (Advil) as needed. Take your usually prescribed medications unless otherwise directed. You should follow a light diet the first 24 hours after arrival home, such as soup and crackers, etc.  Be sure to include lots of fluids daily.  Resume your normal diet the day after surgery. Most patients will experience some swelling and bruising.  Ice packs will help.  Swelling and bruising can take several days to resolve.  It is common to experience some constipation if taking pain medication after surgery.  Increasing fluid intake and taking a stool softener (such as Colace) will usually help or prevent this problem from occurring.  A mild laxative (Milk of Magnesia or Miralax) should be taken according to package directions if there are no bowel movements after 48 hours. Iused skin glue on the incision, you may shower in 24 hours.  The glue will flake off over the next 2-3 weeks.  The tape strips will come off in 2-3 weeks also.  ACTIVITIES:  You may resume regular (light) daily activities beginning the next day--such as daily self-care, walking, climbing stairs--gradually increasing activities as tolerated.  You may have sexual intercourse when it is comfortable.  Refrain from any heavy lifting or straining until approved by your doctor. You may drive when you are no longer taking prescription pain medication, you can comfortably wear a seatbelt, and you can safely maneuver your car and apply brakes. RETURN TO WORK:  __________________________________________________________ Robert [name] should see your doctor in the office for a follow-up appointment approximately 2-3 weeks after your surgery.  Make sure that you call for this appointment within a day or two after you arrive home to insure a convenient appointment time. OTHER INSTRUCTIONS:   __________________________________________________________________________________________________________________________________________________________________________________________  WHEN TO CALL YOUR DOCTOR: Fever over 101.0 Nausea and/or vomiting Extreme swelling or bruising Continued bleeding from incision. Increased pain, redness, or drainage from the incision  The clinic staff is available to answer your questions during regular business hours.  Please don't hesitate to call and ask to speak to one of the nurses for clinical concerns.  If you have a medical emergency, go to the nearest emergency room or call 911.  A surgeon from [facility] is always on call at the hospital   [address], [address], [place], [place]  [address] ?  [address] [address], [place], [place]   [address] [phone] ? [phone] ? FAX [phone] Web site: [email or web address]

## 2023-11-04 NOTE — Op Note (Signed)
 Preoperative diagnosis: right posterior thigh mass Postoperative diagnosis: saa Procedure: excision of 2 cm posterior thigh mass Dr Adina Bury EBL: minimal Anesthesia general Complications none Drains none Specimens right posterior thigh mass Sponge and needle count correct Dispo recovery stable  Indications: 73 year old male who is otherwise fairly healthy except for hypertension who presents with a posterior right thigh mass for about 1 year. This is not really changed in size. He has had no drainage. It has become increasingly painful and is now hard for him to sit down. We discussed excision  Procedure: After informed consent obtained he was taken to the OR.  He was given antibiotics and SCDs were in place.  He was placed under general anesthesia without complication. He was placed in the left lateral position and appropriately padded. He was prepped and draped in standard sterile surgical fashion  He and I had marked this area prior to beginning.  I palpated it and infiltrated marcaine. I made a vertical incision and then dissected to the mass.  This was soft and somewhat fell apart.  I did remove it in its entirety. I did not palpate anything additional and was down to the gluteus.  I obtained hemostasis.  I then closed with 2-0 vicryl.  The skin was closed with 3-0 vicryl and 4-0 monocryl. Glue and steristrips were applied. Tolerated well and transferred to pacy stabke,

## 2023-11-04 NOTE — H&P (Signed)
 73 year old male who is otherwise fairly healthy except for hypertension who presents with a posterior right thigh mass for about 1 year. This is not really changed in size. He has had no drainage. It has become increasingly painful and is now hard for him to sit down. He is referred for evaluation for this mass.  Review of Systems: A complete review of systems was obtained from the patient. I have reviewed this information and discussed as appropriate with the patient. See HPI as well for other ROS.  Review of Systems  All other systems reviewed and are negative.   Medical History: Past Medical History:  Diagnosis Date  Anemia  Anxiety  ED (erectile dysfunction)  GERD (gastroesophageal reflux disease)  History of gastritis  History of UTI  Hx of colonic polyps  Hyperlipidemia  Hypertension  IBS (irritable bowel syndrome)  Myalgia  Sleep apnea  Venous insufficiency   There is no problem list on file for this patient.  Past Surgical History:  Procedure Laterality Date  Colon surgery 07/2009  Polypectomy 2019  COLONOSCOPY 2019  APPENDECTOMY  VASECTOMY    Allergies  Allergen Reactions  Lopid  [Gemfibrozil ] Other (See Comments)  Joint pain   Statins-Hmg-Coa Reductase Inhibitors Other (See Comments)  Joint pain    Current Outpatient Medications on File Prior to Visit  Medication Sig Dispense Refill  aspirin 81 mg tablet Take 81 mg by mouth once daily  escitalopram  oxalate (LEXAPRO ) 5 MG tablet Take 5 mg by mouth once daily  levocetirizine (XYZAL ) 5 MG tablet Take 5 mg by mouth every evening  metoprolol  SUCCinate (TOPROL -XL) 100 MG XL tablet Take 100 mg by mouth once daily  olmesartan -amLODIPine -hydroCHLOROthiazide (TRIBENZOR) 40-5-12.5 mg tablet Take 1 tablet by mouth once daily  omeprazole  (PRILOSEC) 20 MG DR capsule Take 20 mg by mouth 2 (two) times daily before meals  potassium chloride  (KLOR-CON  M20) 20 MEQ ER tablet Take 20 mEq by mouth once daily  tamsulosin   (FLOMAX ) 0.4 mg capsule Take 0.4 mg by mouth daily after dinner   No current facility-administered medications on file prior to visit.   Family History  Problem Relation Age of Onset  Colon polyps Neg Hx  Pancreatic cancer Neg Hx  Rectal cancer Neg Hx  Stomach cancer Neg Hx  Esophageal cancer Neg Hx  Liver cancer Neg Hx  Prostate cancer Neg Hx    Social History   Tobacco Use  Smoking Status Never  Smokeless Tobacco Never  Marital status: Married  Tobacco Use  Smoking status: Never  Smokeless tobacco: Never  Vaping Use  Vaping status: Never Used  Substance and Sexual Activity  Alcohol use: Never  Drug use: Never   Objective:   Vitals:  10/14/23 1350  BP: (!) 157/90  Pulse: 61  Temp: 36.7 C (98 F)  TempSrc: Temporal  SpO2: 99%  Weight: (!) 103.6 kg (228 lb 6.4 oz)  Height: 180.3 cm (5' 11)  PainSc: 0-No pain   Body mass index is 31.86 kg/m.  Physical Exam Vitals reviewed.  Constitutional:  Appearance: Normal appearance.  Musculoskeletal:  Comments: Right posteromedial thigh near buttock 3 cm mobile nontender mass  Neurological:  Mental Status: He is alert.   Assessment and Plan:   Mass of thigh, right  Excision of right thigh mass  We discussed the options of observing this versus excision. It is somewhat hard so I do think it is a reasonable thing to excise this just to make sure that it is something benign. He would like it  excised due to symptoms. We discussed excision under anesthesia with the risks and recovery associated with that.

## 2023-11-04 NOTE — Interval H&P Note (Signed)
 History and Physical Interval Note:  11/04/2023 7:10 AM  Robert [name]  has presented today for surgery, with the diagnosis of RIGHT THIGH MASS.  The various methods of treatment have been discussed with the patient and family. After consideration of risks, benefits and other options for treatment, the patient has consented to  Procedure(s) with comments: EXCISION MASS LOWER EXTREMITIES (Right) - RIGHT THIGH MASS as a surgical intervention.  The patient's history has been reviewed, patient examined, no change in status, stable for surgery.  I have reviewed the patient's chart and labs.  Questions were answered to the patient's satisfaction.     [name]

## 2023-11-05 ENCOUNTER — Encounter (HOSPITAL_COMMUNITY): Payer: Self-pay | Admitting: General Surgery

## 2023-11-05 LAB — SURGICAL PATHOLOGY

## 2023-11-05 NOTE — Anesthesia Postprocedure Evaluation (Signed)
 Anesthesia Post Note  Patient: Robert [name]  Procedure(s) Performed: EXCISION MASS LOWER EXTREMITIES (Right)     Patient location during evaluation: PACU Anesthesia Type: General Level of consciousness: awake and alert Pain management: pain level controlled Vital Signs Assessment: post-procedure vital signs reviewed and stable Respiratory status: spontaneous breathing, nonlabored ventilation, respiratory function stable and patient connected to nasal cannula oxygen Cardiovascular status: blood pressure returned to baseline and stable Postop Assessment: no apparent nausea or vomiting Anesthetic complications: no   No notable events documented.  Last Vitals:  Vitals:   11/04/23 0845 11/04/23 0900  BP: 137/73 (!) 144/76  Pulse: (!) 43 (!) 43  Resp: 11 (!) 9  Temp:  (!) 36.4 C  SpO2: 94% 97%    Last Pain:  Vitals:   11/04/23 0845  TempSrc:   PainSc: 0-No pain                 Epifanio Lamar BRAVO

## 2023-11-10 ENCOUNTER — Other Ambulatory Visit: Payer: Self-pay | Admitting: Internal Medicine

## 2023-11-15 ENCOUNTER — Ambulatory Visit: Payer: Self-pay

## 2023-11-15 DIAGNOSIS — M25561 Pain in right knee: Secondary | ICD-10-CM | POA: Diagnosis not present

## 2023-11-15 NOTE — Telephone Encounter (Signed)
 FYI Only or Action Required?: Action required by provider: request for appointment, update on patient condition, and refused ED recommendation going into the weekend. Wants to be seen in the office.  Patient was last seen in primary care on 09/27/2023 by Plotnikov, Karlynn GAILS, MD.  Called Nurse Triage reporting Leg Swelling.  Symptoms began about a month ago.  Interventions attempted: Rest, hydration, or home remedies.  Symptoms are: unchanged.  Triage Disposition: See Physician Within 24 Hours  Patient/caregiver understands and will follow disposition?: No, wishes to speak with PCP  Copied from CRM #[id number]. Topic: Clinical - Red Word Triage >> Nov 15, 2023  9:36 AM Franky GRADE wrote: Red Word that prompted transfer to Nurse Triage: Patient is experiencing right leg swelling below the knee down to his foot. Symptoms developed about a month ago and have not improved. Reason for Disposition  [1] MODERATE leg swelling (e.g., swelling extends up to knees) AND [2] new-onset or getting worse  Answer Assessment - Initial Assessment Questions Patient endorses moderate to severe swelling to right leg. Patient states the swelling is located to the right knee down to foot. Patient states calf is painful to the touch. Was seen at ortho today and told that swelling isn't related to arthritis. Did recommend patient be seen in the ED for evaluation. Patient refused-stating emergency department is a joke-I would rather be seen next week. Patient is needing a call back from the office.   1. ONSET: When did the swelling start? (e.g., minutes, hours, days)     Started about a month ago 2. LOCATION: What part of the leg is swollen?  Are both legs swollen or just one leg?     Right leg from knee down.  3. SEVERITY: How bad is the swelling? (e.g., localized; mild, moderate, severe)     Moderate-severe 4. REDNESS: Is there redness or signs of infection?     No redness or heat from the leg 5. PAIN:  Is the swelling painful to touch? If Yes, ask: How painful is it?   (Scale 1-10; mild, moderate or severe)     6-7 out of 10 6. FEVER: Do you have a fever? If Yes, ask: What is it, how was it measured, and when did it start?      no 7. CAUSE: What do you think is causing the leg swelling?     Originally thought the swelling was from arthritis but was told by ortho that the swelling isn't from arthritis 8. MEDICAL HISTORY: Do you have a history of blood clots (e.g., DVT), cancer, heart failure, kidney disease, or liver failure?     no 9. RECURRENT SYMPTOM: Have you had leg swelling before? If Yes, ask: When was the last time? What happened that time?     no 10. OTHER SYMPTOMS: Do you have any other symptoms? (e.g., chest pain, difficulty breathing)       no  Protocols used: Leg Swelling and Edema-A-AH

## 2023-11-16 ENCOUNTER — Emergency Department (HOSPITAL_COMMUNITY)

## 2023-11-16 ENCOUNTER — Other Ambulatory Visit: Payer: Self-pay

## 2023-11-16 ENCOUNTER — Emergency Department (HOSPITAL_COMMUNITY)
Admission: EM | Admit: 2023-11-16 | Discharge: 2023-11-16 | Disposition: A | Discharge status: Home / Self Care | Attending: Emergency Medicine | Admitting: Emergency Medicine

## 2023-11-16 DIAGNOSIS — I10 Essential (primary) hypertension: Secondary | ICD-10-CM | POA: Insufficient documentation

## 2023-11-16 DIAGNOSIS — Z79899 Other long term (current) drug therapy: Secondary | ICD-10-CM | POA: Diagnosis not present

## 2023-11-16 DIAGNOSIS — R600 Localized edema: Secondary | ICD-10-CM | POA: Diagnosis not present

## 2023-11-16 DIAGNOSIS — R2241 Localized swelling, mass and lump, right lower limb: Secondary | ICD-10-CM

## 2023-11-16 DIAGNOSIS — M1711 Unilateral primary osteoarthritis, right knee: Secondary | ICD-10-CM | POA: Diagnosis not present

## 2023-11-16 DIAGNOSIS — M7731 Calcaneal spur, right foot: Secondary | ICD-10-CM | POA: Diagnosis not present

## 2023-11-16 DIAGNOSIS — M2011 Hallux valgus (acquired), right foot: Secondary | ICD-10-CM | POA: Diagnosis not present

## 2023-11-16 DIAGNOSIS — Z7982 Long term (current) use of aspirin: Secondary | ICD-10-CM | POA: Diagnosis not present

## 2023-11-16 DIAGNOSIS — M19071 Primary osteoarthritis, right ankle and foot: Secondary | ICD-10-CM | POA: Diagnosis not present

## 2023-11-16 NOTE — ED Triage Notes (Signed)
 Pt came in via POV d/t his Rt lower leg & foot has been swollen for the past couple months. Reports he saw his Dr for his Rt knee arthritis & was told tht his leg/foot pain was not what was causing the swelling (per pt). Reports his PCP told him he should come in for r/o of a blood clot since his calf is swollen as well. A/Ox4, rates his pain 4/10, but as he stands & walks more the swelling is worse & it can hurt more at that time. Feeling like a crap in his calf.

## 2023-11-16 NOTE — Discharge Instructions (Addendum)
 Please wear compression stockings during the day from when you wake up, but take off before going to bed. Elevated extremity above your heart to help with swelling. Use ice, heat or lidocaine over area of pain.

## 2023-11-16 NOTE — ED Provider Notes (Addendum)
 [place] EMERGENCY DEPARTMENT AT [facility] Provider Note   CSN: [id number] Arrival date & time: 11/16/23  9177     Patient presents with: Rt calf/foot swelling   Robert [name]. is a 73 y.o. male patient with past medical history of hypertension, hyperlipidemia, GERD, ED, venous insufficiency presents to emergency room with complaint of 2 months of right lower extremity swelling and discomfort.  He reports that this has been stable.  Swelling is his entire leg and he has pain in his calf that is worse towards the end of the day and with extended walking.  He has not noticed any discoloration.  He has no history of blood clot.  No recent surgeries or recent immobilization.  He denies chest pain shortness of breath cough fever. Does admit to heavy lifting/heavy exercise, wife this this started after overuse/ weight lifting.   HPI     Prior to Admission medications   Medication Sig Start Date End Date Taking? Authorizing Provider  ALPRAZolam  (XANAX ) 0.5 MG tablet Take 1 tablet (0.5 mg total) by mouth 2 (two) times daily as needed for anxiety. 01/15/23   Plotnikov, Aleksei V, MD  Ascorbic Acid (VITAMIN C) 1000 MG tablet Take 1,000 mg by mouth daily.    [provider]  aspirin 81 MG tablet Take 81 mg by mouth daily.    [provider]  ASTRAGALUS PO Take 1 capsule by mouth daily.    [provider]  Cholecalciferol (VITAMIN D ) 125 MCG CAPS Take 1 capsule by mouth daily.    [provider]  Ferrous Sulfate (IRON PO) Take 1 tablet by mouth daily.    [provider]  KRILL OIL PO Take 2,000 mg by mouth daily.    [provider]  levocetirizine (XYZAL ) 5 MG tablet TAKE 1 TABLET(5 MG) BY MOUTH EVERY EVENING 09/30/23   Plotnikov, Aleksei V, MD  metoprolol  succinate (TOPROL -XL) 100 MG 24 hr tablet Take 1 tablet (100 mg total) by mouth daily. Take with or immediately following a meal. Patient taking differently: Take 100 mg by  mouth at bedtime. Take with or immediately following a meal. 01/15/23   Plotnikov, Aleksei V, MD  Misc Natural Products (TURMERIC, CURCUMIN, PO) Take 3 tablets by mouth daily.    [provider]  Olmesartan -amLODIPine -HCTZ 40-5-12.5 MG TABS Take 1 tablet by mouth daily. 01/15/23   Plotnikov, Aleksei V, MD  omeprazole  (PRILOSEC) 20 MG capsule TAKE 1 CAPSULE(20 MG) BY MOUTH TWICE DAILY BEFORE A MEAL 11/11/23   Plotnikov, Aleksei V, MD  potassium chloride  SA (KLOR-CON  M) 20 MEQ tablet Take 1 tablet (20 mEq total) by mouth daily. 10/01/23   Plotnikov, Aleksei V, MD  Red Yeast Rice Extract (RED YEAST RICE PO) Take 2 capsules by mouth daily.    [provider]  sildenafil  (VIAGRA ) 100 MG tablet Take 100 mg by mouth daily as needed for erectile dysfunction.    [provider]  tadalafil  (CIALIS ) 5 MG tablet Take 5 mg by mouth daily as needed for erectile dysfunction.    [provider]  tamsulosin  (FLOMAX ) 0.4 MG CAPS capsule TAKE 1 CAPSULE BY MOUTH EVERY DAY AFTER SUPPER 10/22/22   Plotnikov, Aleksei V, MD    Allergies: Atorvastatin, Lopid  [gemfibrozil ], and Statins    Review of Systems  Cardiovascular:  Positive for leg swelling.    Updated Vital Signs BP (!) 150/67   Pulse 60   Temp 98.7 F (37.1 C)   Resp 18  Ht 5' 11 (1.803 m)   Wt 102.1 kg   SpO2 99%   BMI 31.38 kg/m   Physical Exam Vitals and nursing note reviewed.  Constitutional:      General: He is not in acute distress.    Appearance: He is not toxic-appearing.  HENT:     Head: Normocephalic and atraumatic.  Eyes:     General: No scleral icterus.    Conjunctiva/sclera: Conjunctivae normal.  Cardiovascular:     Rate and Rhythm: Normal rate and regular rhythm.     Pulses: Normal pulses.     Heart sounds: Normal heart sounds.  Pulmonary:     Effort: Pulmonary effort is normal. No respiratory distress.     Breath sounds: Normal breath sounds.  Abdominal:     General: Abdomen is flat.  Bowel sounds are normal.     Palpations: Abdomen is soft.     Tenderness: There is no abdominal tenderness.  Musculoskeletal:     Right lower leg: Edema present.     Left lower leg: No edema.     Comments: Mild pitting edema or right extremity to knee.  Dorsal pedal pulse equal bilaterally and good cap refill.  Sensation intact.  Generalized tenderness over calf.  Skin:    General: Skin is warm and dry.     Findings: No lesion.  Neurological:     General: No focal deficit present.     Mental Status: He is alert and oriented to person, place, and time. Mental status is at baseline.     (all labs ordered are listed, but only abnormal results are displayed) Labs Reviewed - No data to display  EKG: None  Radiology: DG Foot Complete Right Result Date: 11/16/2023 CLINICAL DATA:  Right foot swelling several months. EXAM: RIGHT FOOT COMPLETE - 3+ VIEW COMPARISON:  None Available. FINDINGS: Mild degenerate change over the first MTP joint with minimal hallux valgus deformity. No fracture or dislocation. No bony erosions. Small inferior calcaneal spur. Minimal degenerative change over the midfoot. Remaining soft tissues are unremarkable. IMPRESSION: 1. No acute findings. 2. Mild degenerative changes as described. Electronically Signed   By: Toribio Agreste M.D.   On: 11/16/2023 09:40   DG Tibia/Fibula Right Result Date: 11/16/2023 CLINICAL DATA:  Right lower leg swelling several months. EXAM: RIGHT TIBIA AND FIBULA - 2 VIEW COMPARISON:  None Available. FINDINGS: Mild osteoarthritic change of the knee worse over the medial compartment. No evidence of acute fracture or dislocation. No focal lytic or sclerotic lesion. Inferior calcaneal spur. Soft tissues are unremarkable. IMPRESSION: 1. No acute findings. 2. Mild osteoarthritic change of the knee. Electronically Signed   By: Toribio Agreste M.D.   On: 11/16/2023 09:38     Procedures   Medications Ordered in the ED - No data to display  Clinical  Course as of 11/16/23 1110  Sat Nov 16, 2023  1043 DVT study negative per vascular tech. [JB]    Clinical Course User Index [JB] Gladies Sofranko, Warren SAILOR, PA-C                                 Medical Decision Making Amount and/or Complexity of Data Reviewed Radiology: ordered.   This patient presents to the ED for concern of right leg swelling, this involves an extensive number of treatment options, and is a complaint that carries with it a high risk of complications and morbidity.  The differential diagnosis includes  acute arterial occlusion, PAD, venous insufficiency, DVT, cellulitis, CHF, chronic kidney disease, venous insufficiency   Imaging Studies ordered:  I ordered imaging studies including x-ray of right foot and tib-fib, DVT ultrasound I independently visualized and interpreted imaging which showed x-rays mild degenerative changes of the knee no acute findings.  DVT study negative for DVT I agree with the radiologist interpretation   Cardiac Monitoring: / EKG:  The patient was maintained on a cardiac monitor.     Problem List / ED Course / Critical interventions / Medication management  Patient reports with right leg swelling and discomfort for approximately 2 months.  Patient does have reproducible mild tenderness over the calf.  His sensation is intact and he has good cap refill with strong dorsal pedal pulse.  He has mild pitting edema.  He has no obvious foot wound or ulcer.  No cellulitis.  He reports he believed this pain was secondary to arthritis of his knee, he did see orthopedic doctor yesterday and received an injection, told him this was unlikely to be the cause.  X-rays were done here which shows no acute fracture.  I will obtain a DVT study to rule out blood clot.  Likely no symptoms concerning for PE including no chest pain shortness of breath or cough. Reviewed patient's nurse triage note from yesterday recommending DVT study. Declined medication. I have reviewed  the patients home medicines and have made adjustments as needed Patient is neurovascularly intact.  Stable presentation for 2 months.  Feel stable for discharge at this time with close outpatient follow-up. Given return precautions.        Final diagnoses:  Localized swelling of right lower extremity    ED Discharge Orders     None          Shermon Warren SAILOR, PA-C 11/16/23 1111    Shermon Warren SAILOR, PA-C 11/16/23 1112    Tegeler, Lonni PARAS, MD 11/16/23 1323

## 2023-11-16 NOTE — Progress Notes (Signed)
 VASCULAR LAB    Right lower extremity venous duplex has been performed.  See CV proc for preliminary results.  Messaged results to Warren Shad, PA-C  Harriett Azar, RVT 11/16/2023, 11:09 AM

## 2023-11-18 ENCOUNTER — Ambulatory Visit: Admitting: Internal Medicine

## 2023-11-18 NOTE — Telephone Encounter (Signed)
 Please schedule office visit with any available provider.  Thank you

## 2023-11-21 DIAGNOSIS — G4733 Obstructive sleep apnea (adult) (pediatric): Secondary | ICD-10-CM | POA: Diagnosis not present

## 2023-12-15 ENCOUNTER — Other Ambulatory Visit: Payer: Self-pay | Admitting: Internal Medicine

## 2024-01-18 ENCOUNTER — Other Ambulatory Visit: Payer: Self-pay | Admitting: Internal Medicine

## 2024-01-20 ENCOUNTER — Encounter: Payer: Self-pay | Admitting: Internal Medicine

## 2024-01-20 ENCOUNTER — Ambulatory Visit: Admitting: Internal Medicine

## 2024-01-20 VITALS — BP 120/68 | HR 58 | Ht 71.0 in | Wt 234.4 lb

## 2024-01-20 DIAGNOSIS — F419 Anxiety disorder, unspecified: Secondary | ICD-10-CM | POA: Diagnosis not present

## 2024-01-20 DIAGNOSIS — I2583 Coronary atherosclerosis due to lipid rich plaque: Secondary | ICD-10-CM | POA: Diagnosis not present

## 2024-01-20 DIAGNOSIS — G4733 Obstructive sleep apnea (adult) (pediatric): Secondary | ICD-10-CM

## 2024-01-20 DIAGNOSIS — E785 Hyperlipidemia, unspecified: Secondary | ICD-10-CM

## 2024-01-20 LAB — COMPREHENSIVE METABOLIC PANEL WITH GFR
ALT: 26 U/L (ref 3–53)
AST: 29 U/L (ref 5–37)
Albumin: 4.3 g/dL (ref 3.5–5.2)
Alkaline Phosphatase: 67 U/L (ref 39–117)
BUN: 15 mg/dL (ref 6–23)
CO2: 30 meq/L (ref 19–32)
Calcium: 9.8 mg/dL (ref 8.4–10.5)
Chloride: 101 meq/L (ref 96–112)
Creatinine, Ser: 1.25 mg/dL (ref 0.40–1.50)
GFR: 56.92 mL/min — ABNORMAL LOW
Glucose, Bld: 95 mg/dL (ref 70–99)
Potassium: 3.5 meq/L (ref 3.5–5.1)
Sodium: 136 meq/L (ref 135–145)
Total Bilirubin: 0.6 mg/dL (ref 0.2–1.2)
Total Protein: 7.2 g/dL (ref 6.0–8.3)

## 2024-01-20 LAB — LIPID PANEL
Cholesterol: 277 mg/dL — ABNORMAL HIGH (ref 28–200)
HDL: 45.9 mg/dL
LDL Cholesterol: 176 mg/dL — ABNORMAL HIGH (ref 10–99)
NonHDL: 230.99
Total CHOL/HDL Ratio: 6
Triglycerides: 277 mg/dL — ABNORMAL HIGH (ref 10.0–149.0)
VLDL: 55.4 mg/dL — ABNORMAL HIGH (ref 0.0–40.0)

## 2024-01-20 NOTE — Assessment & Plan Note (Signed)
 Alprazolam  prn  Potential benefits of a long term benzodiazepines  use as well as potential risks  and complications were explained to the patient and were aknowledged. Lexapro  - low dose. Not taking

## 2024-01-20 NOTE — Progress Notes (Signed)
 "  Subjective:  Patient ID: Robert BIRCH Belluomini Jr., male    DOB: 06-Jun-1950  Age: 74 y.o. MRN: [id number]  CC: Results (Discuss lab results )   HPI [name] presents for dyslipidemia - on Red Rice yeast extract and Krill oil F/u on HTN, anxiety  Outpatient Medications Prior to Visit  Medication Sig Dispense Refill   ALPRAZolam  (XANAX ) 0.5 MG tablet Take 1 tablet (0.5 mg total) by mouth 2 (two) times daily as needed for anxiety. 60 tablet 3   Ascorbic Acid (VITAMIN C) 1000 MG tablet Take 1,000 mg by mouth daily.     aspirin 81 MG tablet Take 81 mg by mouth daily.     ASTRAGALUS PO Take 1 capsule by mouth daily.     Cholecalciferol (VITAMIN D ) 125 MCG CAPS Take 1 capsule by mouth daily.     Ferrous Sulfate (IRON PO) Take 1 tablet by mouth daily.     KRILL OIL PO Take 2,000 mg by mouth daily.     levocetirizine (XYZAL ) 5 MG tablet TAKE 1 TABLET(5 MG) BY MOUTH EVERY EVENING 90 tablet 3   metoprolol  succinate (TOPROL -XL) 100 MG 24 hr tablet Take 1 tablet (100 mg total) by mouth daily. Take with or immediately following a meal. (Patient taking differently: Take 100 mg by mouth at bedtime. Take with or immediately following a meal.) 90 tablet 3   Misc Natural Products (TURMERIC, CURCUMIN, PO) Take 3 tablets by mouth daily.     Olmesartan -amLODIPine -HCTZ 40-5-12.5 MG TABS TAKE 1 TABLET BY MOUTH DAILY 90 tablet 3   omeprazole  (PRILOSEC) 20 MG capsule TAKE 1 CAPSULE(20 MG) BY MOUTH TWICE DAILY BEFORE A MEAL 180 capsule 3   potassium chloride  SA (KLOR-CON  M) 20 MEQ tablet Take 1 tablet (20 mEq total) by mouth daily. 90 tablet 3   Red Yeast Rice Extract (RED YEAST RICE PO) Take 2 capsules by mouth daily.     sildenafil  (VIAGRA ) 100 MG tablet Take 100 mg by mouth daily as needed for erectile dysfunction.     tadalafil  (CIALIS ) 5 MG tablet Take 5 mg by mouth daily as needed for erectile dysfunction.     tamsulosin  (FLOMAX ) 0.4 MG CAPS capsule TAKE 1 CAPSULE BY MOUTH EVERY DAY AFTER SUPPER 90  capsule 3   No facility-administered medications prior to visit.    ROS: Review of Systems  Constitutional:  Negative for appetite change, fatigue and unexpected weight change.  HENT:  Negative for congestion, nosebleeds, sneezing, sore throat and trouble swallowing.   Eyes:  Negative for itching and visual disturbance.  Respiratory:  Negative for cough.   Cardiovascular:  Negative for chest pain, palpitations and leg swelling.  Gastrointestinal:  Negative for abdominal distention, blood in stool, diarrhea and nausea.  Genitourinary:  Negative for frequency and hematuria.  Musculoskeletal:  Negative for back pain, gait problem, joint swelling and neck pain.  Skin:  Negative for rash.  Neurological:  Negative for dizziness, tremors, speech difficulty and weakness.  Psychiatric/Behavioral:  Negative for agitation, dysphoric mood, sleep disturbance and suicidal ideas. The patient is nervous/anxious.     Objective:  BP 120/68   Pulse (!) 58   Ht 5' 11 (1.803 m)   Wt 234 lb 6.4 oz (106.3 kg)   SpO2 99%   BMI 32.69 kg/m   BP Readings from Last 3 Encounters:  01/20/24 120/68  11/16/23 (!) 150/67  11/04/23 (!) 144/76    Wt Readings from Last 3 Encounters:  01/20/24 234 lb 6.4  oz (106.3 kg)  11/16/23 225 lb (102.1 kg)  11/04/23 228 lb (103.4 kg)    Physical Exam Constitutional:      General: He is not in acute distress.    Appearance: Normal appearance. He is well-developed.     Comments: NAD  Eyes:     Conjunctiva/sclera: Conjunctivae normal.     Pupils: Pupils are equal, round, and reactive to light.  Neck:     Thyroid : No thyromegaly.     Vascular: No JVD.  Cardiovascular:     Rate and Rhythm: Normal rate and regular rhythm.     Heart sounds: Normal heart sounds. No murmur heard.    No friction rub. No gallop.  Pulmonary:     Effort: Pulmonary effort is normal. No respiratory distress.     Breath sounds: Normal breath sounds. No wheezing or rales.  Chest:      Chest wall: No tenderness.  Abdominal:     General: Bowel sounds are normal. There is no distension.     Palpations: Abdomen is soft. There is no mass.     Tenderness: There is no abdominal tenderness. There is no guarding or rebound.  Musculoskeletal:        General: No tenderness. Normal range of motion.     Cervical back: Normal range of motion.  Lymphadenopathy:     Cervical: No cervical adenopathy.  Skin:    General: Skin is warm and dry.     Findings: No rash.  Neurological:     Mental Status: He is alert and oriented to person, place, and time.     Cranial Nerves: No cranial nerve deficit.     Motor: No abnormal muscle tone.     Coordination: Coordination normal.     Gait: Gait normal.     Deep Tendon Reflexes: Reflexes are normal and symmetric.  Psychiatric:        Behavior: Behavior normal.        Thought Content: Thought content normal.        Judgment: Judgment normal.     Lab Results  Component Value Date   WBC 3.6 (L) 11/01/2023   HGB 13.7 11/01/2023   HCT 40.8 11/01/2023   PLT 187 11/01/2023   GLUCOSE 105 (H) 11/01/2023   CHOL 168 09/27/2023   TRIG 356.0 (H) 09/27/2023   HDL 32.40 (L) 09/27/2023   LDLDIRECT 47.0 10/12/2020   LDLCALC 65 09/27/2023   ALT 40 09/27/2023   AST 50 (H) 09/27/2023   NA 134 (L) 11/01/2023   K 3.9 11/01/2023   CL 100 11/01/2023   CREATININE 1.35 (H) 11/01/2023   BUN 20 11/01/2023   CO2 25 11/01/2023   TSH 2.15 05/09/2023   PSA 1.82 06/25/2022   HGBA1C 6.3 04/10/2021    VAS US  LOWER EXTREMITY VENOUS (DVT) (7a-7p) Result Date: 11/17/2023  Lower Venous DVT Study Patient Name:  Robert Weatherford Whitmyer Jr.  Date of Exam:   11/16/2023 Medical Rec #: [id number]           Accession #:    [id number] Date of Birth: 09-Apr-1950            Patient Gender: M Patient Age:   89 years Exam Location:  [facility] Procedure:      VAS US  LOWER EXTREMITY VENOUS (DVT) Referring Phys: WARREN BARRETT  --------------------------------------------------------------------------------  Indications: Edema of calf and foot, and Knee pain.  Limitations: Poor ultrasound/tissue interface and Edema. Comparison Study: No prior study on file Performing Technologist:  Alberta Lis RVS  Examination Guidelines: A complete evaluation includes B-mode imaging, spectral Doppler, color Doppler, and power Doppler as needed of all accessible portions of each vessel. Bilateral testing is considered an integral part of a complete examination. Limited examinations for reoccurring indications may be performed as noted. The reflux portion of the exam is performed with the patient in reverse Trendelenburg.  +---------+---------------+---------+-----------+----------+--------------+ RIGHT    CompressibilityPhasicitySpontaneityPropertiesThrombus Aging +---------+---------------+---------+-----------+----------+--------------+ CFV      Full           Yes      Yes                                 +---------+---------------+---------+-----------+----------+--------------+ SFJ      Full                                                        +---------+---------------+---------+-----------+----------+--------------+ FV Prox  Full           Yes      Yes                                 +---------+---------------+---------+-----------+----------+--------------+ FV Mid   Full           Yes      Yes                                 +---------+---------------+---------+-----------+----------+--------------+ FV DistalFull           Yes      Yes                                 +---------+---------------+---------+-----------+----------+--------------+ PFV      Full           Yes      Yes                                 +---------+---------------+---------+-----------+----------+--------------+ POP      Full           Yes      Yes                                  +---------+---------------+---------+-----------+----------+--------------+ PTV      Full                                                        +---------+---------------+---------+-----------+----------+--------------+ PERO     Full                                                        +---------+---------------+---------+-----------+----------+--------------+ Gastroc  Full                                                        +---------+---------------+---------+-----------+----------+--------------+  SSV      Full                                                        +---------+---------------+---------+-----------+----------+--------------+   +----+---------------+---------+-----------+----------+--------------+ LEFTCompressibilityPhasicitySpontaneityPropertiesThrombus Aging +----+---------------+---------+-----------+----------+--------------+ CFV Full           Yes      Yes                                 +----+---------------+---------+-----------+----------+--------------+ SFJ Full                                                        +----+---------------+---------+-----------+----------+--------------+     Summary: RIGHT: - There is no evidence of deep vein thrombosis in the lower extremity.  - No cystic structure found in the popliteal fossa. - Effusion noted at medial knee. Area of mixed echogenicity noted mid calf, etiology unknown.  LEFT: - No evidence of common femoral vein obstruction.  - Ultrasound characteristics of enlarged lymph nodes noted in the groin.  *See table(s) above for measurements and observations. Electronically signed by Penne Colorado MD on 11/17/2023 at 10:45:13 AM.    Final    DG Foot Complete Right Result Date: 11/16/2023 CLINICAL DATA:  Right foot swelling several months. EXAM: RIGHT FOOT COMPLETE - 3+ VIEW COMPARISON:  None Available. FINDINGS: Mild degenerate change over the first MTP joint with minimal hallux valgus  deformity. No fracture or dislocation. No bony erosions. Small inferior calcaneal spur. Minimal degenerative change over the midfoot. Remaining soft tissues are unremarkable. IMPRESSION: 1. No acute findings. 2. Mild degenerative changes as described. Electronically Signed   By: Toribio Agreste M.D.   On: 11/16/2023 09:40   DG Tibia/Fibula Right Result Date: 11/16/2023 CLINICAL DATA:  Right lower leg swelling several months. EXAM: RIGHT TIBIA AND FIBULA - 2 VIEW COMPARISON:  None Available. FINDINGS: Mild osteoarthritic change of the knee worse over the medial compartment. No evidence of acute fracture or dislocation. No focal lytic or sclerotic lesion. Inferior calcaneal spur. Soft tissues are unremarkable. IMPRESSION: 1. No acute findings. 2. Mild osteoarthritic change of the knee. Electronically Signed   By: Toribio Agreste M.D.   On: 11/16/2023 09:38    Assessment & Plan:   Problem List Items Addressed This Visit     Dyslipidemia - Primary   Relevant Orders   Comprehensive metabolic panel with GFR   Lipid panel   Anxiety disorder   Alprazolam  prn  Potential benefits of a long term benzodiazepines  use as well as potential risks  and complications were explained to the patient and were aknowledged. Lexapro  - low dose. Not taking      OSA on CPAP   Uncomfortable using CPAP Try chin strap      Coronary atherosclerosis   A cardiac CT scan for coronary calcium  score is 143 in 7/22 On ASA, Krill oil, Red rice yeast Check labs          Relevant Orders   Comprehensive metabolic panel with GFR   Lipid panel      No orders of the defined  types were placed in this encounter.     Follow-up: Return in about 6 months (around 07/19/2024) for a follow-up visit.  Marolyn Noel, MD "

## 2024-01-20 NOTE — Assessment & Plan Note (Signed)
 A cardiac CT scan for coronary calcium  score is 143 in 7/22 On ASA, Krill oil, Red rice yeast Check labs

## 2024-01-20 NOTE — Assessment & Plan Note (Signed)
 Uncomfortable using CPAP Try chin strap

## 2024-01-24 ENCOUNTER — Ambulatory Visit: Payer: Self-pay | Admitting: Internal Medicine

## 2024-01-28 ENCOUNTER — Other Ambulatory Visit: Payer: Self-pay | Admitting: Internal Medicine

## 2024-01-28 DIAGNOSIS — Z789 Other specified health status: Secondary | ICD-10-CM

## 2024-01-28 DIAGNOSIS — E785 Hyperlipidemia, unspecified: Secondary | ICD-10-CM

## 2024-01-28 DIAGNOSIS — I2583 Coronary atherosclerosis due to lipid rich plaque: Secondary | ICD-10-CM

## 2024-01-29 ENCOUNTER — Telehealth: Payer: Self-pay

## 2024-01-29 NOTE — Progress Notes (Unsigned)
 Care Guide Pharmacy Note  01/29/2024 Name: Robert Weatherholtz Kroeze Jr. MRN: [id number] DOB: 1950-03-29  Referred By: Garald Karlynn GAILS, MD Reason for referral: Call Attempt #1, Complex Care Management (Outreach to sch ref w/ pharm), and Call Attempt #2   Robert D Zeitlin Mickey. is a 74 y.o. year old male who is a primary care patient of Plotnikov, Aleksei V, MD.  Robert D Ahmed Jr. was referred to the pharmacist for assistance related to: HLD and DMII  Successful contact was made with the patient to discuss pharmacy services including being ready for the pharmacist to call at least 5 minutes before the scheduled appointment time and to have medication bottles and any blood pressure readings ready for review. The patient agreed to meet with the pharmacist via telephone visit on 02/14/2024.  Robert Razor [facility], [facility] Guide Direct Dial: [phone]  Fax: [phone]

## 2024-01-29 NOTE — Progress Notes (Unsigned)
 Care Guide Pharmacy Note  01/29/2024 Name: Shota Kohrs Gruel Jr. MRN: [id number] DOB: 11/05/1950  Referred By: Garald Karlynn GAILS, MD Reason for referral: Call Attempt #1 and Complex Care Management (Outreach to sch ref w/ pharm)   Camilo D Rane Mickey. is a 74 y.o. year old male who is a primary care patient of Plotnikov, Aleksei V, MD.  Sherle D Ledwell Mickey. was referred to the pharmacist for assistance related to: DMII  An unsuccessful telephone outreach was attempted today to contact the patient who was referred to the pharmacy team for assistance with medication management. Additional attempts will be made to contact the patient.  Doyce Razor [facility], [facility] Guide Direct Dial: [phone]  Fax: [phone]

## 2024-02-14 ENCOUNTER — Other Ambulatory Visit

## 2024-05-18 ENCOUNTER — Ambulatory Visit

## 2024-05-18 ENCOUNTER — Encounter: Admitting: Internal Medicine
